# Patient Record
Sex: Female | Born: 1955 | Race: White | Hispanic: No | Smoking: Former smoker
Health system: Southern US, Community
[De-identification: ages and names within clinical notes are randomized; demographics above are authoritative.]

## PROBLEM LIST (undated history)

## (undated) DIAGNOSIS — I639 Cerebral infarction, unspecified: Secondary | ICD-10-CM

## (undated) DIAGNOSIS — R0602 Shortness of breath: Secondary | ICD-10-CM

## (undated) DIAGNOSIS — F329 Major depressive disorder, single episode, unspecified: Secondary | ICD-10-CM

## (undated) DIAGNOSIS — R351 Nocturia: Secondary | ICD-10-CM

## (undated) DIAGNOSIS — F32A Depression, unspecified: Secondary | ICD-10-CM

## (undated) DIAGNOSIS — H518 Other specified disorders of binocular movement: Secondary | ICD-10-CM

## (undated) DIAGNOSIS — R112 Nausea with vomiting, unspecified: Secondary | ICD-10-CM

## (undated) DIAGNOSIS — E785 Hyperlipidemia, unspecified: Secondary | ICD-10-CM

## (undated) DIAGNOSIS — Z9889 Other specified postprocedural states: Secondary | ICD-10-CM

## (undated) DIAGNOSIS — K8020 Calculus of gallbladder without cholecystitis without obstruction: Secondary | ICD-10-CM

## (undated) DIAGNOSIS — D649 Anemia, unspecified: Secondary | ICD-10-CM

## (undated) HISTORY — PX: TUBAL LIGATION: SHX77

## (undated) HISTORY — DX: Anemia, unspecified: D64.9

---

## 2000-10-09 ENCOUNTER — Other Ambulatory Visit: Admission: RE | Admit: 2000-10-09 | Discharge: 2000-10-09 | Payer: Self-pay | Admitting: Obstetrics and Gynecology

## 2001-06-28 ENCOUNTER — Encounter (INDEPENDENT_AMBULATORY_CARE_PROVIDER_SITE_OTHER): Payer: Self-pay | Admitting: Specialist

## 2001-06-28 ENCOUNTER — Ambulatory Visit (HOSPITAL_COMMUNITY): Admission: RE | Admit: 2001-06-28 | Discharge: 2001-06-28 | Payer: Self-pay | Admitting: Gastroenterology

## 2002-01-23 ENCOUNTER — Encounter (INDEPENDENT_AMBULATORY_CARE_PROVIDER_SITE_OTHER): Payer: Self-pay | Admitting: *Deleted

## 2002-01-23 ENCOUNTER — Ambulatory Visit (HOSPITAL_COMMUNITY): Admission: RE | Admit: 2002-01-23 | Discharge: 2002-01-23 | Payer: Self-pay | Admitting: Gastroenterology

## 2002-06-18 ENCOUNTER — Other Ambulatory Visit: Admission: RE | Admit: 2002-06-18 | Discharge: 2002-06-18 | Payer: Self-pay | Admitting: Obstetrics and Gynecology

## 2003-12-16 ENCOUNTER — Other Ambulatory Visit: Admission: RE | Admit: 2003-12-16 | Discharge: 2003-12-16 | Payer: Self-pay | Admitting: Obstetrics and Gynecology

## 2004-11-08 ENCOUNTER — Other Ambulatory Visit: Admission: RE | Admit: 2004-11-08 | Discharge: 2004-11-08 | Payer: Self-pay | Admitting: Obstetrics and Gynecology

## 2005-05-23 ENCOUNTER — Other Ambulatory Visit: Admission: RE | Admit: 2005-05-23 | Discharge: 2005-05-23 | Payer: Self-pay | Admitting: Obstetrics and Gynecology

## 2009-07-08 ENCOUNTER — Encounter: Admission: RE | Admit: 2009-07-08 | Discharge: 2009-07-08 | Payer: Self-pay | Admitting: Family Medicine

## 2009-09-15 ENCOUNTER — Encounter (INDEPENDENT_AMBULATORY_CARE_PROVIDER_SITE_OTHER): Payer: Self-pay | Admitting: Neurology

## 2009-09-15 ENCOUNTER — Ambulatory Visit: Payer: Self-pay | Admitting: Cardiology

## 2009-09-15 ENCOUNTER — Ambulatory Visit (HOSPITAL_COMMUNITY): Admission: RE | Admit: 2009-09-15 | Discharge: 2009-09-15 | Payer: Self-pay | Admitting: Neurology

## 2009-09-15 ENCOUNTER — Ambulatory Visit: Payer: Self-pay

## 2009-12-10 ENCOUNTER — Telehealth (INDEPENDENT_AMBULATORY_CARE_PROVIDER_SITE_OTHER): Payer: Self-pay | Admitting: *Deleted

## 2010-10-28 NOTE — Progress Notes (Signed)
  Faxed Echo over to [facility] to fax [phone] [facility]  December 10, 2009 2:18 PM

## 2011-02-11 NOTE — Procedures (Signed)
[place]. [facility]  Patient:    Paula Kim, Paula Kim. Visit Number: [id number] MRN: [id number]          Service Type: Attending:  Anselmo Rod, M.D. Dictated by:   Anselmo Rod, M.D. Proc. Date: 01/23/02   CC:         Marcelle Overlie, M.D.   Procedure Report  DATE OF BIRTH:  1956-01-17  REFERRING PHYSICIAN:  Marcelle Overlie, M.D.  PROCEDURE PERFORMED:  Flexible sigmoidoscopy with biopsies.  ENDOSCOPIST:  Anselmo Rod, M.D.  INSTRUMENT USED:  Olympus video colonoscope.  INDICATIONS FOR PROCEDURE:  History of adenomatous polyp removed in a 55 year old white female.  The patient had a somewhat abnormal-looking polyp in the rectum that was adenomatous in nature with no evidence of high grade dysplasia ____________ so repeat flexible sigmoidoscopy is being done to "re-look" at this area and rule out dysplasia or malignancy, or recurrent polyps.  PREPROCEDURE PREPARATION:  Informed consent was procured from the patient. The patient was fasted for eight hours prior to the procedure and prepped with two Fleets enemas the morning of the procedure.  PREPROCEDURE PHYSICAL:  The patient had stable vital signs.  Neck supple. Chest clear to auscultation.  S1, S2 regular.  Abdomen soft with normal bowel sounds.  DESCRIPTION OF PROCEDURE:  The patient was placed in the left lateral decubitus position and sedated with 50 mg of Demerol and 4 mg of Versed intravenously.  Once the patient was adequately sedated and maintained on low-flow oxygen and continuous cardiac monitoring, the Olympus video colonoscope was advanced from the rectum to 50 cm without difficulty. Multiple small rectosigmoid polyps were seen.  These were biopsied for pathology.  The patient had no other abnormalities noted and tolerated the procedure well without complication.  IMPRESSION:  Multiple small rectosigmoid polyps biopsied for pathology.  RECOMMENDATIONS:  Await  pathology results.  Further recommendations depending on pathology results.Dictated by:   Anselmo Rod, M.D. Attending:  Anselmo Rod, M.D. DD:  01/23/02 TD:  01/23/02 Job: [id number]

## 2011-02-11 NOTE — Procedures (Signed)
[place]  Patient:    Paula Kim, Paula Kim Visit Number: [id number] MRN: [id number]          Service Type: END Location: ENDO Attending Physician:  Charna Elizabeth Dictated by:   Anselmo Rod, M.D. Proc. Date: 06/28/01 Admit Date:  06/28/2001   CC:         Marcelle Overlie, M.D.   Procedure Report  DATE OF BIRTH:  May 30, 1956.  PROCEDURE:  Colonoscopy with snare polypectomy x 5.  ENDOSCOPIST:  Anselmo Rod, M.D.  INSTRUMENT USED:  Olympus video colonoscope.  INDICATION FOR PROCEDURE:  A 55 year old white female with a history of rectal bleeding and family history of colon cancer.  Rule out colonic polyps, masses, hemorrhoids, etc.  PREPROCEDURE PREPARATION:  Informed consent was procured from the patient. The patient was fasted for eight hours prior to the procedure and prepped with a bottle of magnesium citrate and a gallon of NuLytely the night prior to the procedure.  PREPROCEDURE PHYSICAL:  VITAL SIGNS:  The patient had stable vital signs.  NECK:  Supple.  CHEST:  Clear to auscultation.  S1, S2 regular.  ABDOMEN:  Soft with normal bowel sounds.  DESCRIPTION OF PROCEDURE:  The patient was placed in the left lateral decubitus position and sedated with 75 mcg of fentanyl and 7 mg of Versed intravenously.  Once the patient was adequately sedate and maintained on low-flow oxygen and continuous cardiac monitoring, the Olympus video colonoscope was advanced from the rectum to the cecum with extreme difficulty secondary to a large amount of residual stool in the right colon.  Five polyps were snared from the left colon.  Two of them were snared from the rectum, one from the rectosigmoid, and two from 30 cm.  Examination seemed somewhat inadequate because the entire colonic mucosa could not be examined properly (because of poor prep).  RECOMMENDATIONS: 1. Await pathology results. 2. Outpatient follow-up in two weeks. 3.  Repeat screening depending on pathology results. Dictated by:   Anselmo Rod, M.D. Attending Physician:  Charna Elizabeth DD:  06/28/01 TD:  06/29/01 Job: [id number]

## 2011-05-05 ENCOUNTER — Other Ambulatory Visit: Payer: Self-pay | Admitting: Obstetrics and Gynecology

## 2011-05-05 DIAGNOSIS — R928 Other abnormal and inconclusive findings on diagnostic imaging of breast: Secondary | ICD-10-CM

## 2011-05-13 ENCOUNTER — Ambulatory Visit
Admission: RE | Admit: 2011-05-13 | Discharge: 2011-05-13 | Disposition: A | Discharge status: Home / Self Care | Payer: 59 | Source: Ambulatory Visit | Attending: Obstetrics and Gynecology | Admitting: Obstetrics and Gynecology

## 2011-05-13 DIAGNOSIS — R928 Other abnormal and inconclusive findings on diagnostic imaging of breast: Secondary | ICD-10-CM

## 2011-12-29 ENCOUNTER — Emergency Department (HOSPITAL_COMMUNITY): Payer: 59

## 2011-12-29 ENCOUNTER — Encounter (HOSPITAL_COMMUNITY): Payer: Self-pay | Admitting: Emergency Medicine

## 2011-12-29 ENCOUNTER — Emergency Department (HOSPITAL_COMMUNITY)
Admission: EM | Admit: 2011-12-29 | Discharge: 2011-12-29 | Disposition: A | Discharge status: Home / Self Care | Payer: 59 | Attending: Emergency Medicine | Admitting: Emergency Medicine

## 2011-12-29 DIAGNOSIS — Z8673 Personal history of transient ischemic attack (TIA), and cerebral infarction without residual deficits: Secondary | ICD-10-CM | POA: Insufficient documentation

## 2011-12-29 DIAGNOSIS — Z79899 Other long term (current) drug therapy: Secondary | ICD-10-CM | POA: Insufficient documentation

## 2011-12-29 DIAGNOSIS — R1011 Right upper quadrant pain: Secondary | ICD-10-CM | POA: Insufficient documentation

## 2011-12-29 DIAGNOSIS — E785 Hyperlipidemia, unspecified: Secondary | ICD-10-CM | POA: Insufficient documentation

## 2011-12-29 HISTORY — DX: Hyperlipidemia, unspecified: E78.5

## 2011-12-29 HISTORY — DX: Cerebral infarction, unspecified: I63.9

## 2011-12-29 LAB — DIFFERENTIAL
Basophils Absolute: 0 10*3/uL (ref 0.0–0.1)
Basophils Relative: 0 % (ref 0–1)
Eosinophils Relative: 0 % (ref 0–5)
Lymphs Abs: 3 10*3/uL (ref 0.7–4.0)

## 2011-12-29 LAB — COMPREHENSIVE METABOLIC PANEL
ALT: 40 U/L — ABNORMAL HIGH (ref 0–35)
AST: 35 U/L (ref 0–37)
Albumin: 3.9 g/dL (ref 3.5–5.2)
BUN: 11 mg/dL (ref 6–23)
GFR calc Af Amer: >90 mL/min (ref >90)
GFR calc non Af Amer: >90 mL/min (ref >90)
Sodium: 138 mEq/L (ref 135–145)
Total Bilirubin: 0.8 mg/dL (ref 0.3–1.2)

## 2011-12-29 LAB — URINALYSIS, ROUTINE W REFLEX MICROSCOPIC
Bilirubin Urine: NEGATIVE
Hgb urine dipstick: NEGATIVE
Ketones, ur: NEGATIVE mg/dL
Specific Gravity, Urine: 1.014 (ref 1.005–1.030)
pH: 6 (ref 5.0–8.0)

## 2011-12-29 LAB — CBC
HCT: 40.1 % (ref 36.0–46.0)
MCV: 89.5 fL (ref 78.0–100.0)
WBC: 9 10*3/uL (ref 4.0–10.5)

## 2011-12-29 LAB — URINE MICROSCOPIC-ADD ON

## 2011-12-29 LAB — LIPASE, BLOOD: Lipase: 30 U/L (ref 11–59)

## 2011-12-29 MED ORDER — ONDANSETRON HCL 4 MG/2ML IJ SOLN
4.0000 mg | Freq: Once | INTRAMUSCULAR | Status: AC
  Administered 2011-12-29: 16:00:00 via INTRAVENOUS

## 2011-12-29 MED ORDER — CEFTRIAXONE SODIUM 250 MG IJ SOLR
250.0000 mg | Freq: Once | INTRAMUSCULAR | Status: AC
  Administered 2011-12-29: 250 mg via INTRAMUSCULAR
  Filled 2011-12-29: qty 250

## 2011-12-29 MED ORDER — ONDANSETRON HCL 4 MG/2ML IJ SOLN
INTRAMUSCULAR | Status: AC
  Filled 2011-12-29: qty 2

## 2011-12-29 MED ORDER — ONDANSETRON 4 MG PO TBDP: 4.0000 mg | ORAL_TABLET | Freq: Four times a day (QID) | ORAL | Status: AC | PRN

## 2011-12-29 MED ORDER — ONDANSETRON HCL 4 MG/2ML IJ SOLN
INTRAMUSCULAR | Status: AC
  Administered 2011-12-29: 4 mg
  Filled 2011-12-29: qty 2

## 2011-12-29 MED ORDER — LIDOCAINE HCL 1 % IJ SOLN
INTRAMUSCULAR | Status: AC
  Administered 2011-12-29: 20 mL
  Filled 2011-12-29: qty 20

## 2011-12-29 MED ORDER — POLYETHYLENE GLYCOL 3350 17 G PO PACK: 17.0000 g | PACK | Freq: Every day | ORAL | Status: AC

## 2011-12-29 MED ORDER — POTASSIUM CHLORIDE CRYS ER 20 MEQ PO TBCR
40.0000 meq | EXTENDED_RELEASE_TABLET | Freq: Once | ORAL | Status: AC
  Administered 2011-12-29: 40 meq via ORAL
  Filled 2011-12-29: qty 2

## 2011-12-29 MED ORDER — PROMETHAZINE HCL 25 MG/ML IJ SOLN
25.0000 mg | Freq: Once | INTRAMUSCULAR | Status: AC
  Administered 2011-12-29: 25 mg via INTRAVENOUS
  Filled 2011-12-29: qty 1

## 2011-12-29 MED ORDER — MORPHINE SULFATE 4 MG/ML IJ SOLN
8.0000 mg | INTRAMUSCULAR | Status: DC | PRN
  Administered 2011-12-29: 8 mg via INTRAVENOUS
  Filled 2011-12-29: qty 2

## 2011-12-29 NOTE — ED Notes (Signed)
Pt to u/s

## 2011-12-29 NOTE — ED Provider Notes (Signed)
History     CSN: [id number]  Arrival date & time 12/29/11  1555   First MD Initiated Contact with Patient 12/29/11 1749      Chief Complaint  Patient presents with  . Abdominal Pain    (Consider location/radiation/quality/duration/timing/severity/associated sxs/prior treatment) HPI Comments: Patient with history of hyperlipidemia and stroke presents emergency Department with chief complaint of vomiting and abdominal pain.  Symptoms began last week on Friday with abdominal cramping and have gradually worsened.  Patient began having emesis episodes Sunday morning and began developing right upper quadrant and epigastric abdominal pain.  The pain does not radiate is rated at a 8/10 in severity and described as sharp and cramping in nature.  Patient began to feel better and did not have any episodes of emesis on Monday Tuesday or Wednesday.  Last evening patient began having severe vomiting and abdominal pain again.  Patient's last bowel movement was greater than 7 days ago.  Patient has no history of abdominal surgery.  Patient denies hematuria, frequency, dysuria, hematemesis, chest pain, shortness of breath, fevers, night sweats, or chills.  Patient is a 56 y.o. female presenting with abdominal pain. The history is provided by the patient.  Abdominal Pain The primary symptoms of the illness include abdominal pain, nausea and vomiting. The primary symptoms of the illness do not include fever, shortness of breath, diarrhea or dysuria.  Additional symptoms associated with the illness include constipation. Symptoms associated with the illness do not include chills, urgency or frequency.    Past Medical History  Diagnosis Date  . Hyperlipidemia   . Stroke     Past Surgical History  Procedure Date  . Tubal ligation     History reviewed. No pertinent family history.  History  Substance Use Topics  . Smoking status: Never Smoker   . Smokeless tobacco: Not on file  . Alcohol Use: No     OB History    Grav Para Term Preterm Abortions TAB SAB Ect Mult Living                  Review of Systems  Constitutional: Negative for fever, chills and appetite change.  HENT: Negative for congestion.   Eyes: Negative for visual disturbance.  Respiratory: Negative for shortness of breath.   Cardiovascular: Negative for chest pain and leg swelling.  Gastrointestinal: Positive for nausea, vomiting, abdominal pain and constipation. Negative for diarrhea.  Genitourinary: Negative for dysuria, urgency and frequency.  Neurological: Negative for dizziness, syncope, weakness, light-headedness, numbness and headaches.  Psychiatric/Behavioral: Negative for confusion.  All other systems reviewed and are negative.    Allergies  Erythromycin  Home Medications   Current Outpatient Rx  Name Route Sig Dispense Refill  . ASPIRIN EC 325 MG PO TBEC Oral Take 325 mg by mouth daily.    . FENOFIBRATE MICRONIZED 200 MG PO CAPS Oral Take 200 mg by mouth daily.    . OMEGA-3 FATTY ACIDS 1000 MG PO CAPS Oral Take 1 g by mouth 2 (two) times daily.    . IBUPROFEN 200 MG PO TABS Oral Take 400 mg by mouth every 8 (eight) hours as needed. For pain.    Marland Kitchen NIACIN ER 500 MG PO TBCR Oral Take 500 mg by mouth 2 (two) times daily with a meal.    . PRAVASTATIN SODIUM 40 MG PO TABS Oral Take 40 mg by mouth daily.    . VENLAFAXINE HCL ER 150 MG PO CP24 Oral Take 150 mg by mouth daily.    Marland Kitchen  ZOLPIDEM TARTRATE 10 MG PO TABS Oral Take 10 mg by mouth at bedtime as needed. For sleep.      BP 144/80  Pulse 71  Temp(Src) 98 F (36.7 C) (Oral)  Ht 5\' 2"  (1.575 m)  Wt 150 lb (68.04 kg)  BMI 27.44 kg/m2  SpO2 100%  Physical Exam  Nursing note and vitals reviewed. Constitutional: Vital signs are normal. She appears well-developed and well-nourished. No distress.  HENT:  Head: Normocephalic and atraumatic.  Mouth/Throat: Uvula is midline, oropharynx is clear and moist and mucous membranes are normal.  Eyes:  Conjunctivae and EOM are normal. Pupils are equal, round, and reactive to light.  Neck: Normal range of motion and full passive range of motion without pain. Neck supple. No spinous process tenderness and no muscular tenderness present. No rigidity. No Brudzinski's sign noted.  Cardiovascular: Normal rate and regular rhythm.   Pulmonary/Chest: Effort normal and breath sounds normal. No accessory muscle usage. Not tachypneic. No respiratory distress.  Abdominal: Soft. Normal appearance and bowel sounds are normal. She exhibits no distension, no ascites, no pulsatile midline mass and no mass. There is tenderness. There is no CVA tenderness. No hernia.    Lymphadenopathy:    She has no cervical adenopathy.  Neurological: She is alert.  Skin: Skin is warm and dry. No rash noted. She is not diaphoretic.  Psychiatric: She has a normal mood and affect. Her speech is normal and behavior is normal.    ED Course  Procedures (including critical care time)  Labs Reviewed  URINALYSIS, ROUTINE W REFLEX MICROSCOPIC - Abnormal; Notable for the following:    APPearance CLOUDY (*)    Urobilinogen, UA 2.0 (*)    Nitrite POSITIVE (*)    Leukocytes, UA TRACE (*)    All other components within normal limits  URINE MICROSCOPIC-ADD ON - Abnormal; Notable for the following:    Squamous Epithelial / LPF FEW (*)    Bacteria, UA FEW (*)    All other components within normal limits  CBC  DIFFERENTIAL  COMPREHENSIVE METABOLIC PANEL   No results found.   No diagnosis found.    MDM  UTI, abdominal pain  Labs reviewed, acute abdominal series with no acute abnormalities. Pt requests stool softener at dc.Pt given a shot of rocephin for UTI and will be dc w keflex for UTI.  [place] pending. Care resumed by Grant Fontana, PA-C. Plan is to dc pt home if normal results or set up appropriate consults/follow up if cholelithiasis is presenet. Vitals are stable, no fever. Tolerating PO fluids > 6 oz.  Lungs are  clear.         Jaci Carrel, [place] 12/30/11 [phone]

## 2011-12-29 NOTE — Discharge Instructions (Signed)
Follow up with your primary care doctor about your hospital visit. Continue to hydrate orally.Take all medications as prescribed & use Zofran as directed for nausea & vomiting.  Read the instructions below for reasons to return to the ER.   Abdominal Pain  Your exam might not show the exact reason you have abdominal pain. Since there are many different causes of abdominal pain, another checkup and more tests may be needed. It is very important to follow up for lasting (persistent) or worsening symptoms. A possible cause of abdominal pain in any person who still has his or her appendix is acute appendicitis. Appendicitis is often hard to diagnose. Normal blood tests, urine tests, ultrasound, and CT scans do not completely rule out early appendicitis or other causes of abdominal pain. Sometimes, only the changes that happen over time will allow appendicitis and other causes of abdominal pain to be determined. Other potential problems that may require surgery may also take time to become more apparent. Because of this, it is important that you follow all of the instructions below.   HOME CARE INSTRUCTIONS  Do not take laxatives unless directed by your caregiver. Rest as much as possible.  Do not eat solid food until your pain is gone: A diet of water, weak decaffeinated tea, broth or bouillon, gelatin, oral rehydration solutions (ORS), frozen ice pops, or ice chips may be helpful.  When pain is gone: Start a light diet (dry toast, crackers, applesauce, or white rice). Increase the diet slowly as long as it does not bother you. Eat no dairy products (including cheese and eggs) and no spicy, fatty, fried, or high-fiber foods.  Use no alcohol, caffeine, or cigarettes.  Take your regular medicines unless your caregiver told you not to.  Take any prescribed medicine as directed.   SEEK IMMEDIATE MEDICAL CARE IF:  The pain does not go away.  You have a fever >101 that persists You keep throwing up (vomiting)  or cannot drink liquids.  The pain becomes localized (Pain in the right side could possibly be appendicitis. In an adult, pain in the left lower portion of the abdomen could be colitis or diverticulitis). You pass bloody or black tarry stools.  You have shaking chills.  There is blood in your vomit or you see blood in your bowel movements.  Your bowel movements stop (become blocked) or you cannot pass gas.  You have bloody, frequent, or painful urination.  You have yellow discoloration in the skin or whites of the eyes.  Your stomach becomes bloated or bigger.  You have dizziness or fainting.  You have chest or back pain.      The 'BRAT' diet is suggested, then progress to diet as tolerated as symptoms abate. Call if bloody stools, persistent diarrhea, vomiting, fever or abdominal pain. Bananas.  Rice.  Applesauce.  Toast (and other simple starches such as crackers, potatoes, noodles).   SEEK IMMEDIATE MEDICAL ATTENTION IF:  You begin having localized abdominal pain that does not go away or becomes severe (The right side could  possibly be appendicitis. In an adult, the left lower portion of the abdomen could be colitis or diverticulitis)   A temperature above 101 develops  Repeated vomiting occurs (multiple uncontrollable episodes) or you are unable to keep fluids down  Blood is being passed in stools or vomit (bright red or black tarry stools).   Return also if you develop chest pain, difficulty breathing, dizziness or fainting, or become confused, poorly responsive, or inconsolable (  young children).   RESOURCE GUIDE  Dental Problems  Patients with Medicaid: [facility] [phone] [address]                                           [phone] [address] Phone:  [phone]                                                  Phone:  [phone]  If unable to pay or uninsured, contact:  [facility] or [facility]. to  become qualified for the adult dental clinic.  Chronic Pain Problems Contact [facility]  [phone] Patients need to be referred by their primary care doctor.  Insufficient Money for Medicine Contact [employer]:  call "211" or [facility] 604 800 8141.  No Primary Care Doctor Call [facility]  [phone] Other agencies that provide inexpensive medical care    [facility]  [phone]    [facility]  [phone]    [facility]  [phone]    [facility]  [phone]    [facility]  (541) 248-3336    [facility]  [phone]  Psychological Services [facility]  [phone] [facility]  [phone] [facility] Mental Health   [phone] (emergency services ([phone])  Substance Abuse Resources [facility]  256-106-6346 [facility] 6478682922 [facility] [place] 6050404553 Floydene Flock 7262244341 Residential & Outpatient Substance Abuse Program  [phone]  Abuse/Neglect [facility] Child Abuse Hotline (862) 870-7904 [facility] Child Abuse Hotline 309-087-5314 (After Hours)  Emergency Shelter [facility] (614) 591-0577  Maternity Homes [facility] at the [place] [facility] [place] (559)330-4356 [facility] 6810645588  MRSA Hotline #:   (519)249-8686    [facility] Resources  [facility] [place]     [employer]                          [facility] Dept. [address] [place]                       [address]      [address]                                                  Cristobal Goldmann Phone:  [phone]                                   Phone:  [phone]                 Phone:  [phone]  [facility] Mental  Health Phone:  [phone]  [facility] Child Abuse Hotline 8737702801 6574631056 (After Hours)

## 2011-12-29 NOTE — ED Notes (Signed)
ZOX:[id number]<AV> Expected date:12/29/11<BR> Expected time: 3:46 PM<BR> Means of arrival:Ambulance<BR> Comments:<BR> M51. 56 yo f. abd pain, no BM in 1 wk, 15 min

## 2011-12-29 NOTE — ED Notes (Addendum)
Per EMS, pt has had N/V and constipation since last Friday.  Given Zofran IV per EMS

## 2011-12-29 NOTE — ED Notes (Signed)
Pt began vomiting clear emesis and now c/o abd pain.  Order received for IV Zofran.

## 2011-12-30 ENCOUNTER — Telehealth (INDEPENDENT_AMBULATORY_CARE_PROVIDER_SITE_OTHER): Payer: Self-pay | Admitting: General Surgery

## 2011-12-30 NOTE — Telephone Encounter (Signed)
Dr. Wayland Salinas contacting [facility] for referral of pt seen last night in the [facility] ER for gallbladder attack.  She "fell through the cracks" there for being referred to [facility] while in the ER at the time, so he was calling to have her seen in the office.  Appt made with Dr. Biagio Quint and pt called.

## 2011-12-30 NOTE — ED Provider Notes (Signed)
8:00 PM Signout from [place], [place]. Pt with RUQ abd pain, vomiting x 1 week. No hx abd surgery. Labs largely unremarkable. Read of abd u/s pending to r/o GB pathology; if negative, can be discharged home. Discharge paperwork (assuming the [place] is negative) has been prepared.  7:30 AM I spoke with Drue Novel, PA-C - I did not officially discharge this patient. Apparently, the patient's nurse saw that the patient's paperwork had been printed, and set the disposition to discharge without my knowledge. Pt's US shows evidence of GB wall thickening and stones. Pt has been contacted at home and is to return to the ED at [facility] for further care to include probable admission (please see Paz's note).  [place], [place] 12/30/11 [phone]

## 2011-12-30 NOTE — ED Notes (Cosign Needed)
8:03 AM Pt chart reviewed and saw results of yesterdays US showing gallstones, wall thickening, and common bile duct dilatation. Patient was discharged with out proper consults and follow up.  Spoke With Grant Fontana, PA-C who states that she did not put the patient up for discharge. I called Ms. Koenig this morning who states she is feeling much better, is currently pain free and has not has an emesis episode since home. She has been advised to return to the ED to receive the care recommended. I have contacted Ulyess Mort to see if we can help patient with ED cost bc her discharge was not indicated. Pt has arranged a ride to the ED at 10 AM and registration has been notified of her arrival. Pt advised to stay NPO and I will resume care upon her arrival. Dr. Ignacia Palma has been notified of the situation and is agreeable with my plan to have pt return to ED for admit to general surgery.   Jaci Carrel, [place] 12/30/11 [phone]

## 2011-12-31 NOTE — ED Provider Notes (Signed)
Medical screening examination/treatment/procedure(s) were performed by non-physician practitioner and as supervising physician I was immediately available for consultation/collaboration.  Toy Baker, MD 12/31/11 [phone]

## 2012-01-02 NOTE — ED Provider Notes (Signed)
Medical screening examination/treatment/procedure(s) were performed by non-physician practitioner and as supervising physician I was immediately available for consultation/collaboration.  Jules Baty R. Jamisha Hoeschen, MD 01/02/12 1456 

## 2012-01-20 ENCOUNTER — Ambulatory Visit (INDEPENDENT_AMBULATORY_CARE_PROVIDER_SITE_OTHER): Payer: 59 | Admitting: General Surgery

## 2012-01-20 ENCOUNTER — Encounter (INDEPENDENT_AMBULATORY_CARE_PROVIDER_SITE_OTHER): Payer: Self-pay | Admitting: General Surgery

## 2012-01-20 DIAGNOSIS — R109 Unspecified abdominal pain: Secondary | ICD-10-CM

## 2012-01-20 DIAGNOSIS — K8020 Calculus of gallbladder without cholecystitis without obstruction: Secondary | ICD-10-CM

## 2012-01-20 NOTE — Progress Notes (Signed)
Patient ID: Paula Kim, female   DOB: [birth year], 56 y.o.   MRN: [id number]  Chief Complaint  Patient presents with  . Pre-op Exam    eval gallbladder with stones    HPI Paula Kim is a 56 y.o. female.  This patient is referred from the emergency room for evaluation of abdominal pain. She says that she has had 10 year history of off-and-on abdominal pain which occurs about every other month unless approximately 1-2 days and then spontaneously resolved. Most recently, she had acute onset of abdominal pain which she describes as lower abdominal discomfort which is bilateral and had no radiation. She had no back pain and she did have some associated dry heaving for a pack for 6 days but no vomiting. She had increasing abdominal pain which t emergency room after calling EMS. She described as a dull pain and she had some associated fevers although she did not measure her temperature and associated sweats. She says that her pain resolved after medications in the emergency room and she is not having symptoms since then. She says that her only relief was obtained with elevating her legs and she denied any specific food association. She denies any heartburn or peptic OC disease. She says that her bowels are normal without any blood in the stools or melena. She does have a history of a colonoscopy and she is "due for another". HPI  Past Medical History  Diagnosis Date  . Hyperlipidemia   . Stroke   . Anemia     Past Surgical History  Procedure Date  . Tubal ligation     Family History  Problem Relation Age of Onset  . COPD Father   . Cancer Maternal Grandfather     colon    Social History History  Substance Use Topics  . Smoking status: Former Smoker    Quit date: 01/19/2006  . Smokeless tobacco: Not on file  . Alcohol Use: No    Allergies  Allergen Reactions  . Erythromycin Swelling and Rash    Current Outpatient Prescriptions  Medication Sig Dispense Refill  .  aspirin EC 325 MG tablet Take 325 mg by mouth daily.      . fenofibrate micronized (LOFIBRA) 200 MG capsule Take 200 mg by mouth daily.      . fish oil-omega-3 fatty acids 1000 MG capsule Take 1 g by mouth 2 (two) times daily.      Marland Kitchen ibuprofen (ADVIL,MOTRIN) 200 MG tablet Take 400 mg by mouth every 8 (eight) hours as needed. For pain.      . niacin (SLO-NIACIN) 500 MG tablet Take 500 mg by mouth 2 (two) times daily with a meal.      . pravastatin (PRAVACHOL) 40 MG tablet Take 40 mg by mouth daily.      Marland Kitchen venlafaxine (EFFEXOR-XR) 150 MG 24 hr capsule Take 150 mg by mouth daily.      Marland Kitchen zolpidem (AMBIEN) 10 MG tablet Take 10 mg by mouth at bedtime as needed. For sleep.        Review of Systems Review of Systems All other review of systems negative or noncontributory except as stated in the HPI  Blood pressure 142/95, pulse 73, temperature 96.4 F (35.8 C), temperature source Temporal, height 5\' 1"  (1.549 m), weight 164 lb 6.4 oz (74.571 kg), SpO2 98.00%.  Physical Exam Physical Exam Physical Exam  Nursing note and vitals reviewed. Constitutional: She is oriented to person, place, and time. She appears well-developed and  well-nourished. No distress.  HENT:  Head: Normocephalic and atraumatic.  Mouth/Throat: No oropharyngeal exudate.  Eyes: Conjunctivae and EOM are normal. Pupils are equal, round, and reactive to light. Right eye exhibits no discharge. Left eye exhibits no discharge. No scleral icterus.  Neck: Normal range of motion. Neck supple. No tracheal deviation present.  Cardiovascular: Normal rate, regular rhythm, normal heart sounds and intact distal pulses.   Pulmonary/Chest: Effort normal and breath sounds normal. No stridor. No respiratory distress. She has no wheezes.  Abdominal: Soft. Bowel sounds are normal. She exhibits no distension and no mass. There is no tenderness. There is no rebound and no guarding.  Musculoskeletal: Normal range of motion. She exhibits no edema and no  tenderness.  Neurological: She is alert and oriented to person, place, and time.  Skin: Skin is warm and dry. No rash noted. She is not diaphoretic. No erythema. No pallor.  Psychiatric: She has a normal mood and affect. Her behavior is normal. Judgment and thought content normal.      Data Reviewed [place], labs  Assessment    Abdominal pain and cholelithiasis Though she does appear to have some small gallstones on ultrasound as well as a thickened gallbladder, her symptoms are not classic for symptomatic cholelithiasis. I am not convinced that her lower abdominal pain is due to her gallstones. Her white count was normal and her LFTs were also normal. I had a long discussion with her regarding this and I recommended that she be seen by her gastroenterologist Dr. Loreta Ave for evaluation and repeat colonoscopy in second opinion with regard to her cholelithiasis. I did offer to perform cholecystectomy given her gallstones and a thickened gallbladder and history of recurrent abdominal pain, however, I did stress the fact that there would certainly be no guarantee of relief of her symptoms given her atypical symptoms. She will see Dr. Loreta Ave and let me know if she would like to have cholecystectomy    Plan    Gastroenterology evaluation and consider cholecystectomy after this.       Lodema Pilot DAVID 01/20/2012, 9:47 AM

## 2012-01-31 ENCOUNTER — Other Ambulatory Visit (INDEPENDENT_AMBULATORY_CARE_PROVIDER_SITE_OTHER): Payer: Self-pay | Admitting: General Surgery

## 2012-03-06 ENCOUNTER — Encounter (HOSPITAL_COMMUNITY): Payer: Self-pay | Admitting: Pharmacy Technician

## 2012-03-12 ENCOUNTER — Encounter (HOSPITAL_COMMUNITY)
Admission: RE | Admit: 2012-03-12 | Discharge: 2012-03-12 | Disposition: A | Discharge status: Home / Self Care | Payer: 59 | Source: Ambulatory Visit | Attending: General Surgery | Admitting: General Surgery

## 2012-03-12 ENCOUNTER — Encounter (HOSPITAL_COMMUNITY): Payer: Self-pay

## 2012-03-12 HISTORY — DX: Major depressive disorder, single episode, unspecified: F32.9

## 2012-03-12 HISTORY — DX: Other specified disorders of binocular movement: H51.8

## 2012-03-12 HISTORY — DX: Nausea with vomiting, unspecified: R11.2

## 2012-03-12 HISTORY — DX: Other specified postprocedural states: Z98.890

## 2012-03-12 HISTORY — DX: Shortness of breath: R06.02

## 2012-03-12 HISTORY — DX: Calculus of gallbladder without cholecystitis without obstruction: K80.20

## 2012-03-12 HISTORY — DX: Depression, unspecified: F32.A

## 2012-03-12 HISTORY — DX: Nocturia: R35.1

## 2012-03-12 LAB — CBC
MCH: 31.6 pg (ref 26.0–34.0)
MCHC: 33.7 g/dL (ref 30.0–36.0)
Platelets: 339 10*3/uL (ref 150–400)
RBC: 4.33 MIL/uL (ref 3.87–5.11)
RDW: 12.7 % (ref 11.5–15.5)

## 2012-03-12 LAB — SURGICAL PCR SCREEN: MRSA, PCR: NEGATIVE

## 2012-03-12 NOTE — Patient Instructions (Signed)
20 Paula Kim  03/12/2012   Your procedure is scheduled on:  03/16/12  Report to SHORT STAY DEPT  at 5:30 AM.  Call this number if you have problems the morning of surgery: [phone]   Remember:   Do not eat food or drink liquids AFTER MIDNIGHT    Take these medicines the morning of surgery with A SIP OF WATER: PEPCID / EFFEXOR   Do not wear jewelry, make-up or nail polish.  Do not wear lotions, powders, or perfumes.   Do not shave legs or underarms 48 hrs. before surgery (men may shave face)  Do not bring valuables to the hospital.  Contacts, dentures or bridgework may not be worn into surgery.  Leave suitcase in the car. After surgery it may be brought to your room.  For patients admitted to the hospital, checkout time is 11:00 AM the day of discharge.   Patients discharged the day of surgery will not be allowed to drive home.    Special Instructions:   Please read over the following fact sheets that you were given: MRSA  Information               SHOWER WITH BETASEPT THE NIGHT BEFORE SURGERY AND THE MORNING OF SURGERY

## 2012-03-16 ENCOUNTER — Encounter (HOSPITAL_COMMUNITY)
Admission: RE | Disposition: A | Discharge status: Home / Self Care | Payer: Self-pay | Source: Ambulatory Visit | Attending: General Surgery

## 2012-03-16 ENCOUNTER — Encounter (HOSPITAL_COMMUNITY): Payer: Self-pay

## 2012-03-16 ENCOUNTER — Ambulatory Visit (HOSPITAL_COMMUNITY): Payer: 59

## 2012-03-16 ENCOUNTER — Observation Stay (HOSPITAL_COMMUNITY)
Admission: RE | Admit: 2012-03-16 | Discharge: 2012-03-17 | Disposition: A | Discharge status: Home / Self Care | Payer: 59 | Source: Ambulatory Visit | Attending: General Surgery | Admitting: General Surgery

## 2012-03-16 ENCOUNTER — Encounter (HOSPITAL_COMMUNITY): Payer: Self-pay | Admitting: Anesthesiology

## 2012-03-16 ENCOUNTER — Ambulatory Visit (HOSPITAL_COMMUNITY): Payer: 59 | Admitting: Anesthesiology

## 2012-03-16 DIAGNOSIS — K8070 Calculus of gallbladder and bile duct without cholecystitis without obstruction: Principal | ICD-10-CM | POA: Insufficient documentation

## 2012-03-16 DIAGNOSIS — K824 Cholesterolosis of gallbladder: Secondary | ICD-10-CM

## 2012-03-16 DIAGNOSIS — K811 Chronic cholecystitis: Secondary | ICD-10-CM

## 2012-03-16 DIAGNOSIS — Z01810 Encounter for preprocedural cardiovascular examination: Secondary | ICD-10-CM | POA: Insufficient documentation

## 2012-03-16 DIAGNOSIS — Z8673 Personal history of transient ischemic attack (TIA), and cerebral infarction without residual deficits: Secondary | ICD-10-CM | POA: Insufficient documentation

## 2012-03-16 DIAGNOSIS — Z01812 Encounter for preprocedural laboratory examination: Secondary | ICD-10-CM | POA: Insufficient documentation

## 2012-03-16 DIAGNOSIS — E785 Hyperlipidemia, unspecified: Secondary | ICD-10-CM | POA: Insufficient documentation

## 2012-03-16 HISTORY — PX: ERCP: SHX5425

## 2012-03-16 HISTORY — PX: CHOLECYSTECTOMY: SHX55

## 2012-03-16 LAB — BASIC METABOLIC PANEL
CO2: 24 mEq/L (ref 19–32)
Calcium: 9.3 mg/dL (ref 8.4–10.5)
GFR calc Af Amer: >90 mL/min (ref >90)
GFR calc non Af Amer: >90 mL/min (ref >90)
Sodium: 136 mEq/L (ref 135–145)

## 2012-03-16 SURGERY — ERCP, WITH INTERVENTION IF INDICATED
Anesthesia: Moderate Sedation

## 2012-03-16 SURGERY — LAPAROSCOPIC CHOLECYSTECTOMY WITH INTRAOPERATIVE CHOLANGIOGRAM
Anesthesia: General | Site: Abdomen | Wound class: Clean Contaminated

## 2012-03-16 MED ORDER — VENLAFAXINE HCL ER 150 MG PO CP24
150.0000 mg | ORAL_CAPSULE | Freq: Every day | ORAL | Status: DC
  Administered 2012-03-17: 150 mg via ORAL
  Filled 2012-03-16: qty 1

## 2012-03-16 MED ORDER — LACTATED RINGERS IV SOLN
INTRAVENOUS | Status: DC | PRN
  Administered 2012-03-16 (×2): via INTRAVENOUS

## 2012-03-16 MED ORDER — HYDROCODONE-ACETAMINOPHEN 5-325 MG PO TABS: 1.0000 | ORAL_TABLET | ORAL | Status: DC | PRN

## 2012-03-16 MED ORDER — FENTANYL CITRATE 0.05 MG/ML IJ SOLN
INTRAMUSCULAR | Status: AC
  Filled 2012-03-16: qty 2

## 2012-03-16 MED ORDER — SODIUM CHLORIDE 0.9 % IR SOLN
Status: DC | PRN
  Administered 2012-03-16: 10 mL

## 2012-03-16 MED ORDER — ONDANSETRON HCL 4 MG PO TABS: 4.0000 mg | ORAL_TABLET | Freq: Four times a day (QID) | ORAL | Status: DC | PRN

## 2012-03-16 MED ORDER — EPINEPHRINE HCL 0.1 MG/ML IJ SOLN
INTRAMUSCULAR | Status: AC
  Filled 2012-03-16: qty 20

## 2012-03-16 MED ORDER — ONDANSETRON HCL 4 MG/2ML IJ SOLN
INTRAMUSCULAR | Status: DC | PRN
  Administered 2012-03-16: 4 mg via INTRAVENOUS

## 2012-03-16 MED ORDER — DIPHENHYDRAMINE HCL 50 MG/ML IJ SOLN
INTRAMUSCULAR | Status: AC
  Filled 2012-03-16: qty 1

## 2012-03-16 MED ORDER — FAMOTIDINE 20 MG PO TABS
20.0000 mg | ORAL_TABLET | Freq: Two times a day (BID) | ORAL | Status: DC
  Administered 2012-03-16 – 2012-03-17 (×2): 20 mg via ORAL
  Filled 2012-03-16 (×3): qty 1

## 2012-03-16 MED ORDER — LACTATED RINGERS IV SOLN
INTRAVENOUS | Status: DC | PRN
  Administered 2012-03-16: 1000 mL

## 2012-03-16 MED ORDER — SODIUM CHLORIDE 0.9 % IV SOLN
INTRAVENOUS | Status: DC | PRN
  Administered 2012-03-16: 14:00:00

## 2012-03-16 MED ORDER — GLUCAGON HCL (RDNA) 1 MG IJ SOLR
INTRAMUSCULAR | Status: AC
  Filled 2012-03-16: qty 2

## 2012-03-16 MED ORDER — CEFAZOLIN SODIUM 1-5 GM-% IV SOLN
INTRAVENOUS | Status: AC
  Filled 2012-03-16: qty 50

## 2012-03-16 MED ORDER — CEFAZOLIN SODIUM-DEXTROSE 2-3 GM-% IV SOLR
2.0000 g | INTRAVENOUS | Status: AC
  Administered 2012-03-16: 1 g via INTRAVENOUS

## 2012-03-16 MED ORDER — FENTANYL CITRATE 0.05 MG/ML IJ SOLN
INTRAMUSCULAR | Status: AC
  Filled 2012-03-16: qty 4

## 2012-03-16 MED ORDER — DEXAMETHASONE SODIUM PHOSPHATE 10 MG/ML IJ SOLN
INTRAMUSCULAR | Status: DC | PRN
  Administered 2012-03-16: 10 mg via INTRAVENOUS

## 2012-03-16 MED ORDER — SODIUM CHLORIDE 0.9 % IJ SOLN
INTRAMUSCULAR | Status: DC | PRN
  Administered 2012-03-16: 14:00:00

## 2012-03-16 MED ORDER — IOHEXOL 300 MG/ML  SOLN
INTRAMUSCULAR | Status: AC
  Filled 2012-03-16: qty 1

## 2012-03-16 MED ORDER — BUPIVACAINE-EPINEPHRINE PF 0.25-1:200000 % IJ SOLN
INTRAMUSCULAR | Status: AC
  Filled 2012-03-16: qty 30

## 2012-03-16 MED ORDER — ROCURONIUM BROMIDE 100 MG/10ML IV SOLN
INTRAVENOUS | Status: DC | PRN
  Administered 2012-03-16: 40 mg via INTRAVENOUS

## 2012-03-16 MED ORDER — MIDAZOLAM HCL 10 MG/2ML IJ SOLN
INTRAMUSCULAR | Status: DC | PRN
  Administered 2012-03-16: 2 mg via INTRAVENOUS
  Administered 2012-03-16 (×2): 1 mg via INTRAVENOUS
  Administered 2012-03-16: 2 mg via INTRAVENOUS
  Administered 2012-03-16: 1 mg via INTRAVENOUS
  Administered 2012-03-16: 2 mg via INTRAVENOUS
  Administered 2012-03-16: 1 mg via INTRAVENOUS

## 2012-03-16 MED ORDER — ACETAMINOPHEN 10 MG/ML IV SOLN
INTRAVENOUS | Status: AC
  Filled 2012-03-16: qty 100

## 2012-03-16 MED ORDER — MORPHINE SULFATE 2 MG/ML IJ SOLN
2.0000 mg | INTRAMUSCULAR | Status: DC | PRN
  Administered 2012-03-16 – 2012-03-17 (×3): 2 mg via INTRAVENOUS
  Filled 2012-03-16 (×3): qty 1

## 2012-03-16 MED ORDER — HYDROMORPHONE HCL PF 1 MG/ML IJ SOLN: 0.2500 mg | INTRAMUSCULAR | Status: DC | PRN

## 2012-03-16 MED ORDER — LIDOCAINE HCL 1 % IJ SOLN
INTRAMUSCULAR | Status: AC
  Filled 2012-03-16: qty 20

## 2012-03-16 MED ORDER — NEOSTIGMINE METHYLSULFATE 1 MG/ML IJ SOLN
INTRAMUSCULAR | Status: DC | PRN
  Administered 2012-03-16: 4 mg via INTRAVENOUS

## 2012-03-16 MED ORDER — EPINEPHRINE HCL 0.1 MG/ML IJ SOLN
INTRAMUSCULAR | Status: AC
  Filled 2012-03-16: qty 10

## 2012-03-16 MED ORDER — GLYCOPYRROLATE 0.2 MG/ML IJ SOLN
INTRAMUSCULAR | Status: DC | PRN
  Administered 2012-03-16: 0.6 mg via INTRAVENOUS

## 2012-03-16 MED ORDER — LIDOCAINE HCL (CARDIAC) 20 MG/ML IV SOLN
INTRAVENOUS | Status: DC | PRN
  Administered 2012-03-16: 50 mg via INTRAVENOUS

## 2012-03-16 MED ORDER — LIDOCAINE-EPINEPHRINE (PF) 1 %-1:200000 IJ SOLN
INTRAMUSCULAR | Status: DC | PRN
  Administered 2012-03-16: 10 mL

## 2012-03-16 MED ORDER — KCL IN DEXTROSE-NACL 20-5-0.45 MEQ/L-%-% IV SOLN
INTRAVENOUS | Status: DC
  Administered 2012-03-16 – 2012-03-17 (×2): via INTRAVENOUS
  Filled 2012-03-16 (×4): qty 1000

## 2012-03-16 MED ORDER — ONDANSETRON HCL 4 MG/2ML IJ SOLN: 4.0000 mg | Freq: Four times a day (QID) | INTRAMUSCULAR | Status: DC | PRN

## 2012-03-16 MED ORDER — ENOXAPARIN SODIUM 40 MG/0.4ML ~~LOC~~ SOLN
40.0000 mg | SUBCUTANEOUS | Status: DC
  Administered 2012-03-17: 40 mg via SUBCUTANEOUS
  Filled 2012-03-16 (×2): qty 0.4

## 2012-03-16 MED ORDER — GLUCAGON HCL (RDNA) 1 MG IJ SOLR
INTRAMUSCULAR | Status: DC | PRN
  Administered 2012-03-16: .5 mL via INTRAVENOUS

## 2012-03-16 MED ORDER — PROPOFOL 10 MG/ML IV BOLUS
INTRAVENOUS | Status: DC | PRN
  Administered 2012-03-16: 50 mg via INTRAVENOUS
  Administered 2012-03-16: 150 mg via INTRAVENOUS

## 2012-03-16 MED ORDER — FENTANYL CITRATE 0.05 MG/ML IJ SOLN
INTRAMUSCULAR | Status: DC | PRN
  Administered 2012-03-16 (×2): 100 ug via INTRAVENOUS
  Administered 2012-03-16: 50 ug via INTRAVENOUS
  Administered 2012-03-16: 100 ug via INTRAVENOUS

## 2012-03-16 MED ORDER — MIDAZOLAM HCL 10 MG/2ML IJ SOLN
INTRAMUSCULAR | Status: AC
  Filled 2012-03-16: qty 4

## 2012-03-16 MED ORDER — LACTATED RINGERS IV SOLN: INTRAVENOUS | Status: DC

## 2012-03-16 MED ORDER — BUTAMBEN-TETRACAINE-BENZOCAINE 2-2-14 % EX AERO
INHALATION_SPRAY | CUTANEOUS | Status: DC | PRN
  Administered 2012-03-16: 1 via TOPICAL

## 2012-03-16 MED ORDER — IOHEXOL 300 MG/ML  SOLN
INTRAMUSCULAR | Status: DC | PRN
  Administered 2012-03-16: 10 mL

## 2012-03-16 MED ORDER — BUPIVACAINE HCL (PF) 0.25 % IJ SOLN
INTRAMUSCULAR | Status: DC | PRN
  Administered 2012-03-16: 10 mL

## 2012-03-16 MED ORDER — FENTANYL CITRATE 0.05 MG/ML IJ SOLN
INTRAMUSCULAR | Status: DC | PRN
  Administered 2012-03-16 (×5): 25 ug via INTRAVENOUS

## 2012-03-16 MED ORDER — ACETAMINOPHEN 10 MG/ML IV SOLN
INTRAVENOUS | Status: DC | PRN
  Administered 2012-03-16: 1000 mg via INTRAVENOUS

## 2012-03-16 SURGICAL SUPPLY — 42 items
ADH SKN CLS APL DERMABOND .7 (GAUZE/BANDAGES/DRESSINGS) ×1
APPLICATOR COTTON TIP 6IN STRL (MISCELLANEOUS) ×8 IMPLANT
APPLIER CLIP LOGIC TI 5 (MISCELLANEOUS) ×2 IMPLANT
APPLIER CLIP ROT 10 11.4 M/L (STAPLE)
APR CLP MED LRG 11.4X10 (STAPLE)
APR CLP MED LRG 33X5 (MISCELLANEOUS) ×1
BAG SPEC RTRVL LRG 6X4 10 (ENDOMECHANICALS) ×1
CABLE HIGH FREQUENCY MONO STRZ (ELECTRODE) ×2 IMPLANT
CANISTER SUCTION 2500CC (MISCELLANEOUS) ×2 IMPLANT
CAUTERY SURG HI TEMP FINE TP (MISCELLANEOUS) ×2 IMPLANT
CHLORAPREP W/TINT 26ML (MISCELLANEOUS) ×2 IMPLANT
CLIP APPLIE ROT 10 11.4 M/L (STAPLE) IMPLANT
CLOTH BEACON ORANGE TIMEOUT ST (SAFETY) ×2 IMPLANT
COVER SURGICAL LIGHT HANDLE (MISCELLANEOUS) ×2 IMPLANT
DECANTER SPIKE VIAL GLASS SM (MISCELLANEOUS) ×4 IMPLANT
DERMABOND ADVANCED (GAUZE/BANDAGES/DRESSINGS) ×1
DERMABOND ADVANCED .7 DNX12 (GAUZE/BANDAGES/DRESSINGS) ×1 IMPLANT
DRAPE C-ARM 42X72 X-RAY (DRAPES) ×2 IMPLANT
DRAPE LAPAROSCOPIC ABDOMINAL (DRAPES) ×2 IMPLANT
ELECT REM PT RETURN 9FT ADLT (ELECTROSURGICAL) ×2
ELECTRODE REM PT RTRN 9FT ADLT (ELECTROSURGICAL) ×1 IMPLANT
ENDOLOOP SUT PDS II  0 18 (SUTURE) ×1
ENDOLOOP SUT PDS II 0 18 (SUTURE) ×1 IMPLANT
GLOVE SURG SS PI 7.5 STRL IVOR (GLOVE) ×4 IMPLANT
GOWN STRL NON-REIN LRG LVL3 (GOWN DISPOSABLE) ×6 IMPLANT
GOWN STRL REIN XL XLG (GOWN DISPOSABLE) ×4 IMPLANT
KIT BASIN OR (CUSTOM PROCEDURE TRAY) ×2 IMPLANT
NS IRRIG 1000ML POUR BTL (IV SOLUTION) ×2 IMPLANT
PENCIL BUTTON HOLSTER BLD 10FT (ELECTRODE) ×2 IMPLANT
POUCH SPECIMEN RETRIEVAL 10MM (ENDOMECHANICALS) ×2 IMPLANT
SCISSORS LAP 5X35 DISP (ENDOMECHANICALS) ×2 IMPLANT
SET CHOLANGIOGRAPH MIX (MISCELLANEOUS) ×2 IMPLANT
SET IRRIG TUBING LAPAROSCOPIC (IRRIGATION / IRRIGATOR) ×2 IMPLANT
STRIP CLOSURE SKIN 1/2X4 (GAUZE/BANDAGES/DRESSINGS) IMPLANT
SUT MNCRL AB 4-0 PS2 18 (SUTURE) ×2 IMPLANT
SUT VICRYL 0 UR6 27IN ABS (SUTURE) ×2 IMPLANT
TOWEL OR 17X26 10 PK STRL BLUE (TOWEL DISPOSABLE) ×2 IMPLANT
TRAY LAP CHOLE (CUSTOM PROCEDURE TRAY) ×2 IMPLANT
TROCAR BALLN 12MMX100 BLUNT (TROCAR) ×2 IMPLANT
TROCAR BLADELESS OPT 5 75 (ENDOMECHANICALS) ×4 IMPLANT
TROCAR XCEL NON-BLD 11X100MML (ENDOMECHANICALS) ×2 IMPLANT
TUBING INSUFFLATION 10FT LAP (TUBING) ×2 IMPLANT

## 2012-03-16 NOTE — H&P (Signed)
    Paula Kim is an 56 y.o. female.  HPI: she is her for cholecystectomy for tx of abdominal pain and cholelithiasis.  See my note from 4/13.  She denies any change in her health since then and says that she is experiencing gasiness.  I have recommended evaluation by Dr. Loreta Ave but she refused.  Past Medical History  Diagnosis Date  . Hyperlipidemia   . PONV (postoperative nausea and vomiting)   . Shortness of breath     WITH EXERTION  . Stroke     2009 - NO RESIDUAL PROBLEMS  . Gallstones   . Nocturia   . Anemia     AS TEENAGER  . Depression   . 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 Tubal ligation     Family History  Problem Relation Age of Onset  . COPD Father   . Cancer Maternal Grandfather     colon    Social History:  reports that she quit smoking about 6 years ago. She does not have any smokeless tobacco history on file. She reports that she does not drink alcohol or use illicit drugs.  Allergies:  Allergies  Allergen Reactions  . Erythromycin Swelling and Rash    Medications: I have reviewed the patient's current medications.  Results for orders placed during the hospital encounter of 03/16/12 (from the past 48 hour(s))  BASIC METABOLIC PANEL     Status: Abnormal   Collection Time   03/16/12  6:09 AM      Component Value Range Comment   Sodium 136  135 - 145 mEq/L    Potassium 3.6  3.5 - 5.1 mEq/L    Chloride 103  96 - 112 mEq/L    CO2 24  19 - 32 mEq/L    Glucose, Bld 110 (*) 70 - 99 mg/dL    BUN 9  6 - 23 mg/dL    Creatinine, Ser 1.61  0.50 - 1.10 mg/dL    Calcium 9.3  8.4 - 09.6 mg/dL    GFR calc non Af Amer >90  >90 mL/min    GFR calc Af Amer >90  >90 mL/min     No results found.  Blood pressure 133/97, pulse 79, temperature 96.7 F (35.9 C), temperature source Oral, resp. rate 18, SpO2 98.00%. General appearance: alert, cooperative and no  distress Resp: clear to auscultation bilaterally Cardio: regular rate and rhythm, S1, S2 normal, no murmur, click, rub or gallop GI: soft, non-tender; bowel sounds normal; no masses,  no organomegaly Extremities: extremities normal, atraumatic, no cyanosis or edema Skin: Skin color, texture, turgor normal. No rashes or lesions  Assessment/Plan: Cholelithiasis and abdominal pain She has gallstones but her pain and symptoms are not classic.  I again discussed with her the options for GI eval and recommended this but she would like to proceed with cholecystectomy. I discussed with her the risk of persistent symptoms postop especially since her symptoms were atypical and she expressed understanding. The risks of infection, bleeding, pain, persistent symptoms, scarring, injury to bowel or bile ducts, retained stone, diarrhea, need for additional procedures, and need for open surgery discussed with the patient. She desires to proceed with lap chole, IOC, possible open    Katie Moch DAVID 03/16/2012, 7:22 AM

## 2012-03-16 NOTE — Progress Notes (Signed)
Pt up and ambulated to bathroom with standby assistance.  Pt voided moderate amount clear yellow urine.

## 2012-03-16 NOTE — Transfer of Care (Signed)
Immediate Anesthesia Transfer of Care Note  Patient: Paula Kim  Procedure(s) Performed: Procedure(s) (LRB): LAPAROSCOPIC CHOLECYSTECTOMY WITH INTRAOPERATIVE CHOLANGIOGRAM (N/A)  Patient Location: PACU  Anesthesia Type: General  Level of Consciousness: awake and sedated  Airway & Oxygen Therapy: Patient Spontanous Breathing and Patient connected to face mask oxygen  Post-op Assessment: Report given to PACU RN, Post -op Vital signs reviewed and stable and has to void - placed on bedpan.  Post vital signs: Reviewed and stable  Complications: No apparent anesthesia complications

## 2012-03-16 NOTE — Progress Notes (Signed)
UR complete 

## 2012-03-16 NOTE — Brief Op Note (Signed)
03/16/2012  9:12 AM  PATIENT:  Paula Kim  56 y.o. female  PRE-OPERATIVE DIAGNOSIS:  to remove gallbladder  POST-OPERATIVE DIAGNOSIS:  abdominal pain  PROCEDURE:  Procedure(s) (LRB): LAPAROSCOPIC CHOLECYSTECTOMY WITH INTRAOPERATIVE CHOLANGIOGRAM (N/A)  SURGEON:  Surgeon(s) and Role:    * Lodema Pilot, DO - Primary    * Velora Heckler, MD - Assisting  PHYSICIAN ASSISTANT:   ASSISTANTS: Gerkin   ANESTHESIA:   general  EBL:  Total I/O In: 1000 [I.V.:1000] Out: -   BLOOD ADMINISTERED:none  DRAINS: none   LOCAL MEDICATIONS USED:  MARCAINE    and LIDOCAINE   SPECIMEN:  Source of Specimen:  gallbladder  DISPOSITION OF SPECIMEN:  PATHOLOGY  COUNTS:  YES  TOURNIQUET:  * No tourniquets in log *  DICTATION: .Other Dictation: Dictation Number [id number]  PLAN OF CARE: awaiting GI to determine if ERCP to be done  PATIENT DISPOSITION:  PACU - hemodynamically stable.   Delay start of Pharmacological VTE agent (>24hrs) due to surgical blood loss or risk of bleeding: no

## 2012-03-16 NOTE — Anesthesia Postprocedure Evaluation (Signed)
  Anesthesia Post-op Note  Patient: Paula Kim  Procedure(s) Performed: Procedure(s) (LRB): LAPAROSCOPIC CHOLECYSTECTOMY WITH INTRAOPERATIVE CHOLANGIOGRAM (N/A)  Patient Location: PACU  Anesthesia Type: General  Level of Consciousness: oriented and sedated  Airway and Oxygen Therapy: Patient Spontanous Breathing  Post-op Pain: mild  Post-op Assessment: Post-op Vital signs reviewed, Patient's Cardiovascular Status Stable, Respiratory Function Stable and Patent Airway  Post-op Vital Signs: stable  Complications: No apparent anesthesia complications

## 2012-03-16 NOTE — Op Note (Signed)
[facility] [address] [place], [place]  [address]  ERCP PROCEDURE REPORT  PATIENT:  Paula, Kim  MR#:  [id number] BIRTHDATE:  24-Jul-1956  GENDER:  female ENDOSCOPIST:  Jeani Hawking, MD ASSISTANT:  Tyrone Nine, RN CGRN PROCEDURE DATE:  03/16/2012 PROCEDURE:  ERCP with removal of stones, ERCP with sphincterotomy ASA CLASS:  Class II INDICATIONS:  stone MEDICATIONS:   Fentanyl 125 mcg IV, Versed 10 mg IV, glucagon 0.5 mg IV TOPICAL ANESTHETIC:  DESCRIPTION OF PROCEDURE:   After the risks benefits and alternatives of the procedure were thoroughly explained, informed consent was obtained.  The  endoscope was introduced through the mouth and advanced to the second portion of the duodenum.  The ampulla was noted to be adjacent to diverticula. The PD was cannulated multiple times and at one point a secondary wire was used in an attempt to cannulate the CBD. This did not work.  The CBD was able to be cannulated with manuvering the the sphincterotome. Contrast injection revealed a dilated CBD and there was a 1 cm stone in the proximal CBD. An 8 mm sphincterotomy was created, however, a blood vessel was nicked. Some bleeding occurred, but it was able to be controlled with spaying epinephrine and injection of the ampulla. The stone was able to be extracted with a balloon catheter. The final occlusion cholangiogram was negative for any retained stones. At this point the procedure was terminated.    The scope was then completely withdrawn from the patient and the procedure terminated. <<PROCEDUREIMAGES>>  COMPLICATIONS:  None  ENDOSCOPIC IMPRESSION: 1) Successful stone extration. RECOMMENDATIONS: 1) Routine post-operative care.  ______________________________ Jeani Hawking, MD  n. Rosalie DoctorJeani Hawking at 03/16/2012 02:25 PM  Elenore Rota, [phone]

## 2012-03-16 NOTE — Consult Note (Signed)
Reason for Consult:Choledocholithiasis Referring Physician: Lodema Pilot, D.O.  Floy Sabina HPI: This is a 56 year old female with ABM pain that was intermittent for many years, but lately it worsened to the point that she presented to the ER.  An ultrasound was performed and it revealed cholelithiasis.  She reports having post prandial abdominal pain and typically it would resolve.  Dr. Biagio Quint evaluated the patient and she elected to have a cholecystectomy.  The surgery was performed today and there was a finding of a stone in the CBD.  The CBD was also noted to be markedly dilated measuring 1.5 - 2 cm.  As a result of the findings a GI consultation was requested.  Past Medical History  Diagnosis Date  . Hyperlipidemia   . PONV (postoperative nausea and vomiting)   . Shortness of breath     WITH EXERTION  . Stroke     2009 - NO RESIDUAL PROBLEMS  . Gallstones   . Nocturia   . Anemia     AS TEENAGER  . Depression   . 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 Tubal ligation     Family History  Problem Relation Age of Onset  . COPD Father   . Cancer Maternal Grandfather     colon    Social History:  reports that she quit smoking about 6 years ago. She does not have any smokeless tobacco history on file. She reports that she does not drink alcohol or use illicit drugs.  Allergies:  Allergies  Allergen Reactions  . Erythromycin Swelling and Rash    Medications:  Scheduled:   .  ceFAZolin (ANCEF) IV  2 g Intravenous 60 min Pre-Op  . fentaNYL       Continuous:   . lactated ringers      Results for orders placed during the hospital encounter of 03/16/12 (from the past 24 hour(s))  BASIC METABOLIC PANEL     Status: Abnormal   Collection Time   03/16/12  6:09 AM      Component Value Range   Sodium 136  135 - 145 mEq/L   Potassium 3.6  3.5 - 5.1 mEq/L   Chloride 103  96 - 112  mEq/L   CO2 24  19 - 32 mEq/L   Glucose, Bld 110 (*) 70 - 99 mg/dL   BUN 9  6 - 23 mg/dL   Creatinine, Ser 4.54  0.50 - 1.10 mg/dL   Calcium 9.3  8.4 - 09.8 mg/dL   GFR calc non Af Amer >90  >90 mL/min   GFR calc Af Amer >90  >90 mL/min     No results found.  ROS:  As stated above in the HPI otherwise negative.  Blood pressure 148/72, pulse 89, temperature 98.1 F (36.7 C), temperature source Oral, resp. rate 12, SpO2 94.00%.    PE: Gen: NAD, Alert and Oriented HEENT:  [place]/AT, EOMI Neck: Supple, no LAD Lungs: CTA Bilaterally CV: RRR without M/G/R ABM: Soft, tender at the incision sites, +BS Ext: No C/C/E  Assessment/Plan: 1) Choledocholithiasis  Plan: 1) ERCP today.  She is still groggy from the anesthesia, but given the findings and her history a the ERCP is deemed necessary at this point to resolve her issues.  I did discuss the risks of the procedure, namely post ERCP pancreatitis.  Asiah Browder D 03/16/2012, 11:55  AM

## 2012-03-16 NOTE — Progress Notes (Signed)
Pt is resting comfortably.  Pt has no complaints.

## 2012-03-16 NOTE — Anesthesia Preprocedure Evaluation (Addendum)
Anesthesia Evaluation  Patient identified by MRN, date of birth, ID band Patient awake    Reviewed: Allergy & Precautions, H&P , NPO status , Patient's Chart, lab work & pertinent test results, reviewed documented beta blocker date and time   Airway Mallampati: II TM Distance: >3 FB Neck ROM: Full    Dental  (+) Upper Dentures and Lower Dentures   Pulmonary former smoker breath sounds clear to auscultation        Cardiovascular negative cardio ROS  Rhythm:Regular Rate:Normal  Denies cardiac symptoms   Neuro/Psych CVA, No Residual Symptoms negative psych ROS   GI/Hepatic Neg liver ROS, gallstones   Endo/Other  negative endocrine ROS  Renal/GU negative Renal ROS  negative genitourinary   Musculoskeletal negative musculoskeletal ROS (+)   Abdominal   Peds negative pediatric ROS (+)  Hematology negative hematology ROS (+)   Anesthesia Other Findings   Reproductive/Obstetrics negative OB ROS                          Anesthesia Physical Anesthesia Plan  ASA: III  Anesthesia Plan: General   Post-op Pain Management:    Induction: Intravenous  Airway Management Planned: Oral ETT  Additional Equipment:   Intra-op Plan:   Post-operative Plan: Extubation in OR  Informed Consent: I have reviewed the patients History and Physical, chart, labs and discussed the procedure including the risks, benefits and alternatives for the proposed anesthesia with the patient or authorized representative who has indicated his/her understanding and acceptance.     Plan Discussed with: CRNA and Surgeon  Anesthesia Plan Comments:        Anesthesia Quick Evaluation

## 2012-03-17 ENCOUNTER — Other Ambulatory Visit (INDEPENDENT_AMBULATORY_CARE_PROVIDER_SITE_OTHER): Payer: Self-pay | Admitting: General Surgery

## 2012-03-17 LAB — COMPREHENSIVE METABOLIC PANEL
ALT: 102 U/L — ABNORMAL HIGH (ref 0–35)
AST: 50 U/L — ABNORMAL HIGH (ref 0–37)
Alkaline Phosphatase: 155 U/L — ABNORMAL HIGH (ref 39–117)
CO2: 25 mEq/L (ref 19–32)
Calcium: 8.9 mg/dL (ref 8.4–10.5)
GFR calc Af Amer: >90 mL/min (ref >90)
Glucose, Bld: 128 mg/dL — ABNORMAL HIGH (ref 70–99)
Potassium: 3.7 mEq/L (ref 3.5–5.1)
Sodium: 137 mEq/L (ref 135–145)
Total Protein: 6.8 g/dL (ref 6.0–8.3)

## 2012-03-17 MED ORDER — TRAMADOL HCL 50 MG PO TABS: 50.0000 mg | ORAL_TABLET | Freq: Four times a day (QID) | ORAL | Status: AC | PRN

## 2012-03-17 NOTE — Progress Notes (Signed)
Assessment unchanged. Verbalized understanding of dc instructions. No scripts. Discharged via wc to front entrance to meet awaiting vehicle to carry home. Accompanied by NT and granddaughter.

## 2012-03-17 NOTE — Progress Notes (Signed)
1 Day Post-Op  Subjective: Feels good.  Has had two meals with no pain or nausea.  Objective: Vital signs in last 24 hours: Temp:  [98 F (36.7 C)-98.7 F (37.1 C)] 98.7 F (37.1 C) (06/22 1423) Pulse Rate:  [72-94] 84  (06/22 1423) Resp:  [18] 18  (06/22 1423) BP: (117-164)/(57-91) 149/88 mmHg (06/22 1423) SpO2:  [92 %-99 %] 99 % (06/22 1423) Last BM Date: 03/16/12  Intake/Output from previous day: 06/21 0701 - 06/22 0700 In: 3015 [I.V.:3015] Out: 1100 [Urine:1100] Intake/Output this shift: Total I/O In: 360 [P.O.:360] Out: 800 [Urine:800]  PE: Abd-soft, nontender, incisions clean and intact  Lab Results:  No results found for this basename: WBC:2,HGB:2,HCT:2,PLT:2 in the last 72 hours BMET  Basename 03/17/12 0440 03/16/12 0609  NA 137 136  K 3.7 3.6  CL 103 103  CO2 25 24  GLUCOSE 128* 110*  BUN 7 9  CREATININE 0.48* 0.61  CALCIUM 8.9 9.3   PT/INR No results found for this basename: LABPROT:2,INR:2 in the last 72 hours Comprehensive Metabolic Panel:    Component Value Date/Time   NA 137 03/17/2012 0440   K 3.7 03/17/2012 0440   CL 103 03/17/2012 0440   CO2 25 03/17/2012 0440   BUN 7 03/17/2012 0440   CREATININE 0.48* 03/17/2012 0440   GLUCOSE 128* 03/17/2012 0440   CALCIUM 8.9 03/17/2012 0440   AST 50* 03/17/2012 0440   ALT 102* 03/17/2012 0440   ALKPHOS 155* 03/17/2012 0440   BILITOT 0.7 03/17/2012 0440   PROT 6.8 03/17/2012 0440   ALBUMIN 3.2* 03/17/2012 0440     Studies/Results: Dg Ercp With Sphincterotomy  03/16/2012  *RADIOLOGY REPORT*  Clinical Data: Cholelithiasis and biliary ductal dilation on recent ultrasound.  ERCP 03/16/2012:  Comparison:  Abdominal ultrasound 12/29/2011.  Technique:  Multiple spot images were obtained with the fluoroscopic device and submitted for interpretation post- procedure.  ERCP was performed by Dr. Elnoria Howard.  Findings: Six spot images from the C-arm fluoroscopic device were submitted for interpretation post-procedure.  Prior  cholecystectomy.  Moderate dilation of the common bile duct, common hepatic duct, and left main intrahepatic bile duct.  Filling defect within the common hepatic duct consistent with a gallstone.  Dr. Elnoria Howard performed sphincterotomy and removed the stone.  Completion image shows no residual filling defect.  The radiologic technologist documented 5.1 minutes of fluoroscopy time.  IMPRESSION: Common bile duct stone which was removed by Dr. Elnoria Howard.  These images were submitted for radiologic interpretation only. Please see the procedural report for the amount of contrast and the fluoroscopy time utilized.  Original Report Authenticated By: Arnell Sieving, M.D.    Anti-infectives: Anti-infectives     Start     Dose/Rate Route Frequency Ordered Stop   03/16/12 0546   ceFAZolin (ANCEF) IVPB 2 g/50 mL premix        2 g 100 mL/hr over 30 Minutes Intravenous 60 min pre-op 03/16/12 0546 03/16/12 0740          Assessment Active Problems:  Cholelithiasis and choledocholithiasis s/p lap chole and ERCP 6/21- Some elevation of LFTS and lipase but she is doing well clinically.  I spoke with Dr. Loreta Ave (GI) who felt it was okay to send her home.    LOS: 1 day   Plan: Discharge.  Instructions given.   Paula Kim Paula Kim 03/17/2012

## 2012-03-17 NOTE — Discharge Instructions (Signed)
Laparoscopic Cholecystectomy Care After Refer to this sheet in the next few weeks. These instructions provide you with information on caring for yourself after your procedure. Your caregiver may also give you more specific instructions. Your treatment has been planned according to current medical practices, but problems sometimes occur. Call your caregiver if you have any problems or questions after your procedure. HOME CARE INSTRUCTIONS   Change bandages (dressings) as directed by your caregiver.   Keep the wound dry and clean.   You may shower 03/18/12.  Do not take baths or use swimming pools or hot tubs for 10 days, or as instructed by your caregiver.   Only take over-the-counter or prescription medicines for pain, discomfort, or fever as directed by your caregiver.   Continue your normal diet as directed by your caregiver.   Do not lift anything heavier than 10 pounds for 2 weeks.  Do not play contact sports for 1 week, or as directed by your caregiver.   Strict lowfat diet.  Appointment with Dr. Biagio Quint in 2 weeks.  Call the office to make this appointment at [phone] during normal business hours. SEEK MEDICAL CARE IF:   There is redness, swelling, or increasing pain in the wound.   You notice yellowish-white fluid (pus) coming from the wound.   There is drainage from the wound that lasts longer than 1 day.   There is a bad smell coming from the wound or dressing.   The surgical cut (incision) breaks open.  SEEK IMMEDIATE MEDICAL CARE IF:   You develop a rash.   You have difficulty breathing.   You develop chest pain.   You develop any reaction or side effects to medicines given.   You have a fever greater than 101.5.  You have increasing pain in the shoulders (shoulder strap areas).   You have dizzy episodes or faint while standing.   You develop severe abdominal pain.   You feel sick to your stomach (nauseous) or throw up (vomit) and this lasts for more than 1  day.  MAKE SURE YOU:   Understand these instructions.   Will watch your condition.   Will get help right away if you are not doing well or get worse.  Document Released: 09/12/2005 Document Revised: 09/01/2011 Document Reviewed: 02/25/2011 [facility] Patient Information 2012 [place], [place].

## 2012-03-17 NOTE — Op Note (Signed)
NAME:  Paula Kim, Paula Kim             ACCOUNT NO.:  [id number]  MEDICAL RECORD NO.:  [id number]  LOCATION:  [facility]                         FACILITY:  [facility]  PHYSICIAN:  Lodema Pilot, MD       DATE OF BIRTH:  Feb 16, 1956  DATE OF PROCEDURE:  03/16/2012 DATE OF DISCHARGE:                              OPERATIVE REPORT   PROCEDURE:  Laparoscopic cholecystectomy with intraoperative cholangiogram.  PREOPERATIVE DIAGNOSIS:  Abdominal pain.  POSTOPERATIVE DIAGNOSIS:  Abdominal pain and common bile duct stones.  SURGEON:  Lodema Pilot, MD  ASSISTANT:  Velora Heckler, MD  ANESTHESIA:  General anesthesia with 1% lidocaine and 0.25% Marcaine with epinephrine in a 50:50 mixture.  FLUIDS:  One liter of crystalloid.  ESTIMATED BLOOD LOSS:  Minimal.  DRAINS:  None.  SPECIMENS:  Gallbladder and contents sent to pathology for permanent section.  COMPLICATIONS:  None apparent.  FINDINGS:  Mildly thickened gallbladder.  She did have some small stones which were milked back for retrograde through the cystic duct, abnormal cholangiogram with apparent filling defects in the common bile duct concerning for common bile duct stone, although she did have free flow of bile into the duodenum.  OPERATIVE DETAILS:  Paula Kim was seen and evaluated in the preop area and risks and benefits of the procedure were discussed in lay terms. Informed consent was obtained, but again discussed with her the risk of persistent symptoms after procedure given her atypical symptoms.  She expressed understanding and desired to proceed with cholecystectomy. She was given prophylactic antibiotics and taken to the operating room, placed on the table in supine position and general endotracheal tube anesthesia was obtained.  Her abdomen was prepped and draped in a standard surgical fashion.  Procedure time-out was performed with all operative team members to confirm proper patient, procedure.  Then a supraumbilical midline  incision was made in the skin and dissection carried down through the abdominal wall.  Using blunt dissection, abdominal wall fascia was elevated and sharply incised.  The peritoneum was entered directly.  A 12 mm balloon port was placed and pneumoperitoneum was obtained.  Laparoscope was introduced and there was no evidence of bowel injury upon entry.  Two 5 mm right upper quadrant trocars were placed under direct visualization and a 11 mm epigastric trocar was placed under direct visualization.  The gallbladder was retracted cephalad and it did appear slightly thickened, chronically inflamed but no evidence of acute cholecystitis.  The peritoneum was taken down from the gallbladder using blunt dissection.  Cystic artery was identified and clipped between hemoclips with 2 clips on the stay side, however, it was not divided at this time and the cystic duct was skeletonized and a critical view of safety was obtained, visualizing the liver parenchyma through the triangle of Calot with a single cystic duct and a single cystic artery.  The cystic duct was very dilated and thick. A clip was placed on the gallbladder side and a small cystic ductotomy was made and a cholangiogram catheter passed through the abdominal wall. A cholangiogram was performed.  This demonstrated very dilated cystic duct, dilated common bile duct, and dilated intrahepatic ducts and right and left hepatic ducts as well.  She appeared to have a filling defect in the distal common bile duct.  Although she did, I was able to get flow of contrast into the duodenum.  This was concerning for possible common bile duct stone.  The cholangiogram catheter was removed and 2 clips were placed on the cystic duct and a 0 PDS Endoloop was placed on the cystic duct stump and cystic artery which was already clipped and divided.  The gallbladder was then removed from the gallbladder fossa using Bovie electrocautery and the gallbladder was  not entered during the dissection.  The gallbladder was completely removed and placed in an EndoCatch bag and removed from the umbilical trocar site.  I did not palpate any stones within the gallbladder after it was removed.  It was sent to pathology for permanent section.  The gallbladder fossa was inspected for hemostasis which was noted to be adequate and the right upper quadrant was irrigated with sterile saline solution.  The irrigation returned clear.  The clips and Endoloop appeared to be in good position.  There was no evidence of bleeding or bowel injury in the right upper quadrant and umbilical trocar site.  The umbilical trocar was removed and the fascia was approximated with interrupted 0 Vicryl sutures in open fashion and the abdomen was re-insufflated with carbon dioxide gas and the abdominal wall closure was noted to be adequate without any evidence of bowel injury.  The trocars removed and the abdominal wall was noted to be hemostatic and the skin incisions were injected with 25 mL of 1% lidocaine and 0.25% Marcaine with epinephrine in a 50:50 mixture in the skin edges.  The skin was wound was closed with 4-0 Monocryl subcuticular suture.  Skin was washed and dried. Dermabond was applied.  All sponge, needle, and instrument counts were correct at the end of the case.  The patient tolerated the procedure well without apparent complications.  She was transferred to the PACU where I called her gastroenterologist, Dr. Loreta Ave regarding her cholangiogram findings.          ______________________________ Lodema Pilot, MD     BL/MEDQ  D:  03/16/2012  T:  03/16/2012  Job:  [id number]

## 2012-03-19 ENCOUNTER — Encounter (HOSPITAL_COMMUNITY): Payer: Self-pay | Admitting: Gastroenterology

## 2012-04-02 NOTE — Discharge Summary (Signed)
Physician Discharge Summary  Patient ID: Paula Kim MRN: [id number] DOB/AGE: [birth year] 56 y.o.  Admit date: 03/16/2012 Discharge date: 04/02/2012  Admission Diagnoses:  Discharge Diagnoses:  Active Problems:  * No active hospital problems. *    Discharged Condition: good  Hospital Course: to OR for elective lap cholecystectomy/IOC.  She had an abnormal cholangiogram and so GI was consulted.  ERCP performed later that day and she was kept for overnight observation.  She was ready for discharge home on POD 1.  Consults: GI  Significant Diagnostic Studies: ercp  Treatments: surgery: lap cholecystectomy/IOC, and ERCP  Disposition: 01-Home or Self Care  Discharge Orders    Future Appointments: Provider: Department: Dept Phone: Center:   04/19/2012 10:30 AM Lodema Pilot, DO [facility] [phone] None     Medication List  As of 04/02/2012 10:23 AM   TAKE these medications         aspirin EC 325 MG tablet   Take 325 mg by mouth daily.      famotidine 20 MG tablet   Commonly known as: PEPCID   Take 20 mg by mouth 2 (two) times daily.      fenofibrate micronized 200 MG capsule   Commonly known as: LOFIBRA   Take 200 mg by mouth daily.      fish oil-omega-3 fatty acids 1000 MG capsule   Take 1 g by mouth 2 (two) times daily.      methylcellulose 1 % ophthalmic solution   Commonly known as: ARTIFICIAL TEARS   Place 1 drop into both eyes as needed. Dry eyes      niacin 500 MG tablet   Commonly known as: SLO-NIACIN   Take 500 mg by mouth 2 (two) times daily with a meal.      pravastatin 40 MG tablet   Commonly known as: PRAVACHOL   Take 40 mg by mouth daily.      traMADol 50 MG tablet   Commonly known as: ULTRAM   Take 1 tablet (50 mg total) by mouth every 6 (six) hours as needed for pain.      venlafaxine XR 150 MG 24 hr capsule   Commonly known as: EFFEXOR-XR   Take 150 mg by mouth daily.      vitamin B-12 500 MCG tablet   Commonly known as:  CYANOCOBALAMIN   Take 500 mcg by mouth 2 (two) times daily.      zolpidem 10 MG tablet   Commonly known as: AMBIEN   Take 10 mg by mouth at bedtime as needed. For sleep.             SignedLodema Pilot DAVID 04/02/2012, 10:23 AM

## 2012-04-19 ENCOUNTER — Ambulatory Visit (INDEPENDENT_AMBULATORY_CARE_PROVIDER_SITE_OTHER): Payer: 59 | Admitting: General Surgery

## 2012-04-19 ENCOUNTER — Encounter (INDEPENDENT_AMBULATORY_CARE_PROVIDER_SITE_OTHER): Payer: Self-pay | Admitting: General Surgery

## 2012-04-19 VITALS — BP 134/86 | HR 71 | Temp 97.1°F | Resp 16 | Ht 61.0 in | Wt 158.4 lb

## 2012-04-19 DIAGNOSIS — Z4889 Encounter for other specified surgical aftercare: Secondary | ICD-10-CM

## 2012-04-19 DIAGNOSIS — Z5189 Encounter for other specified aftercare: Secondary | ICD-10-CM

## 2012-04-19 NOTE — Progress Notes (Signed)
Subjective:     Patient ID: Paula Kim, female   DOB: 1956/07/15, 56 y.o.   MRN: [id number]  HPI This patient follows up status post-laparoscopic cholecystectomy for possible relief of her abdominal pain. She says that she feels remarkably well. She has not had any appears preoperative symptoms since her surgery and she says even her reflux has improved. She has not had any nausea or vomiting and she has resumed taking her usual diet including some fatty foods and has not had any issues. She has had some occasional loose stools which he says is actually less than she had anticipated. She has no complaints.  Review of Systems     Objective:   Physical Exam No distress and nontoxic-appearing Her abdomen is soft and nontender on exam and her incisions are well-healed without signs of infection. There is no evidence of hernia.    Assessment:     Status post laparoscopic cholecystectomy-doing well Her incisions are healing well and there is no sign of infection. There is no evidence of postoperative complications. Her pathology was benign. Her symptoms have completely resolved and she feels much better. I have no resections 4 and she can resume regular diet and regular activity. I did recommend that she follow up with Dr. Loreta Ave for followup colonoscopy.    Plan:     She can follow up with [facility] on a p.r.n. basis and follow up with Dr. Loreta Ave for repeat colonoscopy.

## 2014-05-07 ENCOUNTER — Other Ambulatory Visit: Payer: Self-pay | Admitting: Obstetrics and Gynecology

## 2014-05-08 LAB — CYTOLOGY - PAP

## 2014-11-18 ENCOUNTER — Other Ambulatory Visit: Payer: Self-pay | Admitting: Obstetrics and Gynecology

## 2014-11-19 LAB — CYTOLOGY - PAP

## 2015-06-17 ENCOUNTER — Other Ambulatory Visit: Payer: Self-pay | Admitting: Obstetrics and Gynecology

## 2015-06-18 LAB — CYTOLOGY - PAP

## 2019-11-14 ENCOUNTER — Inpatient Hospital Stay (HOSPITAL_COMMUNITY): Payer: Self-pay

## 2019-11-14 ENCOUNTER — Encounter (HOSPITAL_COMMUNITY): Admission: EM | Disposition: E | Payer: Self-pay | Source: Home / Self Care | Attending: Neurology

## 2019-11-14 ENCOUNTER — Inpatient Hospital Stay (HOSPITAL_COMMUNITY): Payer: Self-pay | Admitting: Anesthesiology

## 2019-11-14 ENCOUNTER — Emergency Department (HOSPITAL_COMMUNITY): Payer: Self-pay

## 2019-11-14 ENCOUNTER — Encounter (HOSPITAL_COMMUNITY): Payer: Self-pay | Admitting: Pharmacy Technician

## 2019-11-14 ENCOUNTER — Inpatient Hospital Stay: Payer: Self-pay

## 2019-11-14 ENCOUNTER — Inpatient Hospital Stay (HOSPITAL_COMMUNITY)
Admission: EM | Admit: 2019-11-14 | Discharge: 2019-12-26 | DRG: 023 | Disposition: E | Payer: Self-pay | Attending: Neurology | Admitting: Neurology

## 2019-11-14 ENCOUNTER — Other Ambulatory Visit: Payer: Self-pay

## 2019-11-14 DIAGNOSIS — I700 Atherosclerosis of aorta: Secondary | ICD-10-CM | POA: Diagnosis present

## 2019-11-14 DIAGNOSIS — D62 Acute posthemorrhagic anemia: Secondary | ICD-10-CM | POA: Diagnosis not present

## 2019-11-14 DIAGNOSIS — Z825 Family history of asthma and other chronic lower respiratory diseases: Secondary | ICD-10-CM

## 2019-11-14 DIAGNOSIS — E041 Nontoxic single thyroid nodule: Secondary | ICD-10-CM | POA: Diagnosis present

## 2019-11-14 DIAGNOSIS — G8194 Hemiplegia, unspecified affecting left nondominant side: Secondary | ICD-10-CM | POA: Diagnosis present

## 2019-11-14 DIAGNOSIS — R4020 Unspecified coma: Secondary | ICD-10-CM | POA: Diagnosis not present

## 2019-11-14 DIAGNOSIS — R1310 Dysphagia, unspecified: Secondary | ICD-10-CM | POA: Diagnosis present

## 2019-11-14 DIAGNOSIS — E785 Hyperlipidemia, unspecified: Secondary | ICD-10-CM | POA: Diagnosis present

## 2019-11-14 DIAGNOSIS — J95851 Ventilator associated pneumonia: Secondary | ICD-10-CM | POA: Diagnosis not present

## 2019-11-14 DIAGNOSIS — R471 Dysarthria and anarthria: Secondary | ICD-10-CM | POA: Diagnosis present

## 2019-11-14 DIAGNOSIS — R4701 Aphasia: Secondary | ICD-10-CM | POA: Diagnosis present

## 2019-11-14 DIAGNOSIS — B9561 Methicillin susceptible Staphylococcus aureus infection as the cause of diseases classified elsewhere: Secondary | ICD-10-CM | POA: Diagnosis not present

## 2019-11-14 DIAGNOSIS — Z79899 Other long term (current) drug therapy: Secondary | ICD-10-CM

## 2019-11-14 DIAGNOSIS — J432 Centrilobular emphysema: Secondary | ICD-10-CM | POA: Diagnosis present

## 2019-11-14 DIAGNOSIS — Z0189 Encounter for other specified special examinations: Secondary | ICD-10-CM

## 2019-11-14 DIAGNOSIS — Z6827 Body mass index (BMI) 27.0-27.9, adult: Secondary | ICD-10-CM

## 2019-11-14 DIAGNOSIS — R000 Tachycardia, unspecified: Secondary | ICD-10-CM | POA: Diagnosis not present

## 2019-11-14 DIAGNOSIS — J9602 Acute respiratory failure with hypercapnia: Secondary | ICD-10-CM | POA: Diagnosis not present

## 2019-11-14 DIAGNOSIS — R29728 NIHSS score 28: Secondary | ICD-10-CM | POA: Diagnosis not present

## 2019-11-14 DIAGNOSIS — Z20822 Contact with and (suspected) exposure to covid-19: Secondary | ICD-10-CM | POA: Diagnosis present

## 2019-11-14 DIAGNOSIS — I639 Cerebral infarction, unspecified: Secondary | ICD-10-CM

## 2019-11-14 DIAGNOSIS — I63511 Cerebral infarction due to unspecified occlusion or stenosis of right middle cerebral artery: Principal | ICD-10-CM | POA: Diagnosis present

## 2019-11-14 DIAGNOSIS — I6389 Other cerebral infarction: Secondary | ICD-10-CM

## 2019-11-14 DIAGNOSIS — I712 Thoracic aortic aneurysm, without rupture: Secondary | ICD-10-CM | POA: Diagnosis present

## 2019-11-14 DIAGNOSIS — R739 Hyperglycemia, unspecified: Secondary | ICD-10-CM | POA: Diagnosis not present

## 2019-11-14 DIAGNOSIS — Z881 Allergy status to other antibiotic agents status: Secondary | ICD-10-CM

## 2019-11-14 DIAGNOSIS — I739 Peripheral vascular disease, unspecified: Secondary | ICD-10-CM | POA: Diagnosis present

## 2019-11-14 DIAGNOSIS — I714 Abdominal aortic aneurysm, without rupture: Secondary | ICD-10-CM | POA: Diagnosis present

## 2019-11-14 DIAGNOSIS — D72829 Elevated white blood cell count, unspecified: Secondary | ICD-10-CM

## 2019-11-14 DIAGNOSIS — Z800 Family history of malignant neoplasm of digestive organs: Secondary | ICD-10-CM

## 2019-11-14 DIAGNOSIS — Z87891 Personal history of nicotine dependence: Secondary | ICD-10-CM

## 2019-11-14 DIAGNOSIS — Y95 Nosocomial condition: Secondary | ICD-10-CM | POA: Diagnosis not present

## 2019-11-14 DIAGNOSIS — K5900 Constipation, unspecified: Secondary | ICD-10-CM | POA: Diagnosis not present

## 2019-11-14 DIAGNOSIS — F1910 Other psychoactive substance abuse, uncomplicated: Secondary | ICD-10-CM | POA: Diagnosis present

## 2019-11-14 DIAGNOSIS — F419 Anxiety disorder, unspecified: Secondary | ICD-10-CM | POA: Diagnosis not present

## 2019-11-14 DIAGNOSIS — R233 Spontaneous ecchymoses: Secondary | ICD-10-CM | POA: Diagnosis present

## 2019-11-14 DIAGNOSIS — H518 Other specified disorders of binocular movement: Secondary | ICD-10-CM | POA: Diagnosis present

## 2019-11-14 DIAGNOSIS — Z66 Do not resuscitate: Secondary | ICD-10-CM | POA: Diagnosis not present

## 2019-11-14 DIAGNOSIS — Z8673 Personal history of transient ischemic attack (TIA), and cerebral infarction without residual deficits: Secondary | ICD-10-CM

## 2019-11-14 DIAGNOSIS — G936 Cerebral edema: Secondary | ICD-10-CM | POA: Diagnosis present

## 2019-11-14 DIAGNOSIS — Z515 Encounter for palliative care: Secondary | ICD-10-CM | POA: Diagnosis not present

## 2019-11-14 DIAGNOSIS — E663 Overweight: Secondary | ICD-10-CM | POA: Diagnosis present

## 2019-11-14 DIAGNOSIS — J0100 Acute maxillary sinusitis, unspecified: Secondary | ICD-10-CM | POA: Diagnosis not present

## 2019-11-14 DIAGNOSIS — Z4659 Encounter for fitting and adjustment of other gastrointestinal appliance and device: Secondary | ICD-10-CM

## 2019-11-14 DIAGNOSIS — E876 Hypokalemia: Secondary | ICD-10-CM | POA: Diagnosis present

## 2019-11-14 DIAGNOSIS — F329 Major depressive disorder, single episode, unspecified: Secondary | ICD-10-CM | POA: Diagnosis present

## 2019-11-14 DIAGNOSIS — Z7982 Long term (current) use of aspirin: Secondary | ICD-10-CM

## 2019-11-14 DIAGNOSIS — R509 Fever, unspecified: Secondary | ICD-10-CM

## 2019-11-14 DIAGNOSIS — J9601 Acute respiratory failure with hypoxia: Secondary | ICD-10-CM | POA: Diagnosis present

## 2019-11-14 DIAGNOSIS — R578 Other shock: Secondary | ICD-10-CM | POA: Diagnosis not present

## 2019-11-14 DIAGNOSIS — R29810 Facial weakness: Secondary | ICD-10-CM | POA: Diagnosis present

## 2019-11-14 DIAGNOSIS — G9341 Metabolic encephalopathy: Secondary | ICD-10-CM | POA: Diagnosis not present

## 2019-11-14 HISTORY — PX: CRANIOTOMY: SHX93

## 2019-11-14 LAB — COMPREHENSIVE METABOLIC PANEL
ALT: 18 U/L (ref 0–44)
AST: 29 U/L (ref 15–41)
Albumin: 4.2 g/dL (ref 3.5–5.0)
Alkaline Phosphatase: 101 U/L (ref 38–126)
Anion gap: 15 (ref 5–15)
BUN: 12 mg/dL (ref 8–23)
CO2: 23 mmol/L (ref 22–32)
Calcium: 9.5 mg/dL (ref 8.9–10.3)
Chloride: 102 mmol/L (ref 98–111)
Creatinine, Ser: 0.77 mg/dL (ref 0.44–1.00)
GFR calc Af Amer: >60 mL/min (ref >60)
GFR calc non Af Amer: >60 mL/min (ref >60)
Glucose, Bld: 127 mg/dL — ABNORMAL HIGH (ref 70–99)
Potassium: 3.2 mmol/L — ABNORMAL LOW (ref 3.5–5.1)
Sodium: 140 mmol/L (ref 135–145)
Total Bilirubin: 1 mg/dL (ref 0.3–1.2)
Total Protein: 8.1 g/dL (ref 6.5–8.1)

## 2019-11-14 LAB — POCT I-STAT 7, (LYTES, BLD GAS, ICA,H+H)
Acid-base deficit: 1 mmol/L (ref 0.0–2.0)
Bicarbonate: 23.9 mmol/L (ref 20.0–28.0)
Calcium, Ion: 1.12 mmol/L — ABNORMAL LOW (ref 1.15–1.40)
HCT: 28 % — ABNORMAL LOW (ref 36.0–46.0)
Hemoglobin: 9.5 g/dL — ABNORMAL LOW (ref 12.0–15.0)
O2 Saturation: 98 %
Patient temperature: 98.4
Potassium: 2.7 mmol/L — CL (ref 3.5–5.1)
Sodium: 140 mmol/L (ref 135–145)
TCO2: 25 mmol/L (ref 22–32)
pCO2 arterial: 41 mmHg (ref 32.0–48.0)
pH, Arterial: 7.374 (ref 7.350–7.450)
pO2, Arterial: 114 mmHg — ABNORMAL HIGH (ref 83.0–108.0)

## 2019-11-14 LAB — CBC WITH DIFFERENTIAL/PLATELET
Abs Immature Granulocytes: 0.05 10*3/uL (ref 0.00–0.07)
Basophils Absolute: 0 10*3/uL (ref 0.0–0.1)
Basophils Relative: 0 %
Eosinophils Absolute: 0 10*3/uL (ref 0.0–0.5)
Eosinophils Relative: 0 %
HCT: 38 % (ref 36.0–46.0)
Hemoglobin: 13 g/dL (ref 12.0–15.0)
Immature Granulocytes: 1 %
Lymphocytes Relative: 9 %
Lymphs Abs: 1 10*3/uL (ref 0.7–4.0)
MCH: 30.6 pg (ref 26.0–34.0)
MCHC: 34.2 g/dL (ref 30.0–36.0)
MCV: 89.4 fL (ref 80.0–100.0)
Monocytes Absolute: 0.5 10*3/uL (ref 0.1–1.0)
Monocytes Relative: 4 %
Neutro Abs: 9.5 10*3/uL — ABNORMAL HIGH (ref 1.7–7.7)
Neutrophils Relative %: 86 %
Platelets: 259 10*3/uL (ref 150–400)
RBC: 4.25 MIL/uL (ref 3.87–5.11)
RDW: 11.9 % (ref 11.5–15.5)
WBC: 11.1 10*3/uL — ABNORMAL HIGH (ref 4.0–10.5)
nRBC: 0 % (ref 0.0–0.2)

## 2019-11-14 LAB — RAPID URINE DRUG SCREEN, HOSP PERFORMED
Amphetamines: NOT DETECTED
Barbiturates: NOT DETECTED
Benzodiazepines: NOT DETECTED
Cocaine: NOT DETECTED
Opiates: NOT DETECTED
Tetrahydrocannabinol: POSITIVE — AB

## 2019-11-14 LAB — URINALYSIS, ROUTINE W REFLEX MICROSCOPIC
Bilirubin Urine: NEGATIVE
Glucose, UA: NEGATIVE mg/dL
Hgb urine dipstick: NEGATIVE
Ketones, ur: 5 mg/dL — AB
Leukocytes,Ua: NEGATIVE
Nitrite: NEGATIVE
Protein, ur: NEGATIVE mg/dL
Specific Gravity, Urine: >1.046 — ABNORMAL HIGH (ref 1.005–1.030)
pH: 7 (ref 5.0–8.0)

## 2019-11-14 LAB — SODIUM
Sodium: 140 mmol/L (ref 135–145)
Sodium: 138 mmol/L (ref 135–145)

## 2019-11-14 LAB — TYPE AND SCREEN
ABO/RH(D): A POS
Antibody Screen: NEGATIVE

## 2019-11-14 LAB — ABO/RH: ABO/RH(D): A POS

## 2019-11-14 LAB — I-STAT CHEM 8, ED
BUN: 12 mg/dL (ref 8–23)
Calcium, Ion: 1.09 mmol/L — ABNORMAL LOW (ref 1.15–1.40)
Chloride: 104 mmol/L (ref 98–111)
Creatinine, Ser: 0.5 mg/dL (ref 0.44–1.00)
Glucose, Bld: 112 mg/dL — ABNORMAL HIGH (ref 70–99)
HCT: 41 % (ref 36.0–46.0)
Hemoglobin: 13.9 g/dL (ref 12.0–15.0)
Potassium: 3 mmol/L — ABNORMAL LOW (ref 3.5–5.1)
Sodium: 140 mmol/L (ref 135–145)
TCO2: 26 mmol/L (ref 22–32)

## 2019-11-14 LAB — CBG MONITORING, ED: Glucose-Capillary: 124 mg/dL — ABNORMAL HIGH (ref 70–99)

## 2019-11-14 LAB — RESPIRATORY PANEL BY RT PCR (FLU A&B, COVID)
Influenza A by PCR: NEGATIVE
Influenza B by PCR: NEGATIVE
SARS Coronavirus 2 by RT PCR: NEGATIVE

## 2019-11-14 LAB — MRSA PCR SCREENING: MRSA by PCR: NEGATIVE

## 2019-11-14 LAB — PROTIME-INR
INR: 1 (ref 0.8–1.2)
Prothrombin Time: 12.6 seconds (ref 11.4–15.2)

## 2019-11-14 LAB — ECHOCARDIOGRAM COMPLETE

## 2019-11-14 LAB — APTT: aPTT: 20 seconds — ABNORMAL LOW (ref 24–36)

## 2019-11-14 LAB — HIV ANTIBODY (ROUTINE TESTING W REFLEX): HIV Screen 4th Generation wRfx: NONREACTIVE

## 2019-11-14 LAB — ETHANOL: Alcohol, Ethyl (B): <10 mg/dL (ref <10)

## 2019-11-14 SURGERY — CRANIOTOMY HEMATOMA EVACUATION SUBDURAL
Anesthesia: General | Site: Head | Laterality: Right

## 2019-11-14 MED ORDER — ORAL CARE MOUTH RINSE
15.0000 mL | OROMUCOSAL | Status: DC
  Administered 2019-11-14 – 2019-11-25 (×105): 15 mL via OROMUCOSAL

## 2019-11-14 MED ORDER — ONDANSETRON HCL 4 MG/2ML IJ SOLN
INTRAMUSCULAR | Status: DC | PRN
  Administered 2019-11-14: 4 mg via INTRAVENOUS

## 2019-11-14 MED ORDER — OXYCODONE HCL 5 MG/5ML PO SOLN: 5.0000 mg | Freq: Once | ORAL | Status: DC | PRN

## 2019-11-14 MED ORDER — SODIUM CHLORIDE 0.9 % IV BOLUS
1000.0000 mL | Freq: Once | INTRAVENOUS | Status: AC
  Administered 2019-11-14: 1000 mL via INTRAVENOUS

## 2019-11-14 MED ORDER — SODIUM CHLORIDE 0.9 % IV SOLN: INTRAVENOUS | Status: DC | PRN

## 2019-11-14 MED ORDER — BACITRACIN ZINC 500 UNIT/GM EX OINT
TOPICAL_OINTMENT | CUTANEOUS | Status: AC
  Filled 2019-11-14: qty 28.35

## 2019-11-14 MED ORDER — LIDOCAINE 2% (20 MG/ML) 5 ML SYRINGE
INTRAMUSCULAR | Status: DC | PRN
  Administered 2019-11-14: 30 mg via INTRAVENOUS

## 2019-11-14 MED ORDER — THROMBIN 5000 UNITS EX SOLR
CUTANEOUS | Status: AC
  Filled 2019-11-14: qty 5000

## 2019-11-14 MED ORDER — THROMBIN 20000 UNITS EX SOLR
CUTANEOUS | Status: AC
  Filled 2019-11-14: qty 20000

## 2019-11-14 MED ORDER — CHLORHEXIDINE GLUCONATE 0.12% ORAL RINSE (MEDLINE KIT)
15.0000 mL | Freq: Two times a day (BID) | OROMUCOSAL | Status: DC
  Administered 2019-11-14 – 2019-11-25 (×22): 15 mL via OROMUCOSAL

## 2019-11-14 MED ORDER — POTASSIUM CHLORIDE 20 MEQ/15ML (10%) PO SOLN: 40.0000 meq | ORAL | Status: DC

## 2019-11-14 MED ORDER — ASPIRIN 300 MG RE SUPP
300.0000 mg | Freq: Every day | RECTAL | Status: DC
  Filled 2019-11-14: qty 1

## 2019-11-14 MED ORDER — HYDROCODONE-ACETAMINOPHEN 5-325 MG PO TABS: 1.0000 | ORAL_TABLET | ORAL | Status: DC | PRN

## 2019-11-14 MED ORDER — NALOXONE HCL 0.4 MG/ML IJ SOLN: 0.0800 mg | INTRAMUSCULAR | Status: DC | PRN

## 2019-11-14 MED ORDER — DEXMEDETOMIDINE HCL IN NACL 400 MCG/100ML IV SOLN
0.4000 ug/kg/h | INTRAVENOUS | Status: DC
  Administered 2019-11-14: 0.4 ug/kg/h via INTRAVENOUS
  Administered 2019-11-15: 0.3 ug/kg/h via INTRAVENOUS
  Administered 2019-11-16: 19:00:00 0.7 ug/kg/h via INTRAVENOUS
  Administered 2019-11-17 (×2): 0.8 ug/kg/h via INTRAVENOUS
  Administered 2019-11-18: 1.2 ug/kg/h via INTRAVENOUS
  Administered 2019-11-18: 0.8 ug/kg/h via INTRAVENOUS
  Administered 2019-11-18: 1.3 ug/kg/h via INTRAVENOUS
  Administered 2019-11-18 – 2019-11-19 (×2): 0.8 ug/kg/h via INTRAVENOUS
  Administered 2019-11-19 – 2019-11-20 (×2): 1 ug/kg/h via INTRAVENOUS
  Administered 2019-11-20: 03:00:00 0.9 ug/kg/h via INTRAVENOUS
  Administered 2019-11-20 – 2019-11-21 (×2): 1 ug/kg/h via INTRAVENOUS
  Administered 2019-11-21: 17:00:00 1.2 ug/kg/h via INTRAVENOUS
  Administered 2019-11-21: 1 ug/kg/h via INTRAVENOUS
  Administered 2019-11-21 (×2): 1.5 ug/kg/h via INTRAVENOUS
  Administered 2019-11-22: 0.8 ug/kg/h via INTRAVENOUS
  Administered 2019-11-22: 1 ug/kg/h via INTRAVENOUS
  Administered 2019-11-22: 04:00:00 1.5 ug/kg/h via INTRAVENOUS
  Administered 2019-11-22: 11:00:00 0.8 ug/kg/h via INTRAVENOUS
  Administered 2019-11-23 – 2019-11-25 (×8): 0.9 ug/kg/h via INTRAVENOUS
  Administered 2019-11-25: 1.2 ug/kg/h via INTRAVENOUS
  Filled 2019-11-14 (×7): qty 100
  Filled 2019-11-14 (×2): qty 200
  Filled 2019-11-14 (×4): qty 100
  Filled 2019-11-14: qty 200
  Filled 2019-11-14: qty 100
  Filled 2019-11-14: qty 200
  Filled 2019-11-14: qty 100
  Filled 2019-11-14: qty 200
  Filled 2019-11-14: qty 100
  Filled 2019-11-14: qty 200
  Filled 2019-11-14 (×10): qty 100

## 2019-11-14 MED ORDER — PHENYLEPHRINE 40 MCG/ML (10ML) SYRINGE FOR IV PUSH (FOR BLOOD PRESSURE SUPPORT)
PREFILLED_SYRINGE | INTRAVENOUS | Status: AC
  Filled 2019-11-14: qty 10

## 2019-11-14 MED ORDER — OXYCODONE HCL 5 MG PO TABS: 5.0000 mg | ORAL_TABLET | Freq: Once | ORAL | Status: DC | PRN

## 2019-11-14 MED ORDER — PROPOFOL 10 MG/ML IV BOLUS: INTRAVENOUS | Status: DC | PRN

## 2019-11-14 MED ORDER — LIDOCAINE 2% (20 MG/ML) 5 ML SYRINGE: INTRAMUSCULAR | Status: DC | PRN

## 2019-11-14 MED ORDER — PROPOFOL 10 MG/ML IV BOLUS
INTRAVENOUS | Status: DC | PRN
  Administered 2019-11-14: 50 mg via INTRAVENOUS
  Administered 2019-11-14: 150 ug via INTRAVENOUS

## 2019-11-14 MED ORDER — ONDANSETRON HCL 4 MG PO TABS: 4.0000 mg | ORAL_TABLET | ORAL | Status: DC | PRN

## 2019-11-14 MED ORDER — LABETALOL HCL 5 MG/ML IV SOLN
INTRAVENOUS | Status: DC | PRN
  Administered 2019-11-14 (×2): 10 mg via INTRAVENOUS

## 2019-11-14 MED ORDER — BACITRACIN ZINC 500 UNIT/GM EX OINT
TOPICAL_OINTMENT | CUTANEOUS | Status: DC | PRN
  Administered 2019-11-14: 1 via TOPICAL

## 2019-11-14 MED ORDER — ONDANSETRON HCL 4 MG/2ML IJ SOLN: 4.0000 mg | INTRAMUSCULAR | Status: DC | PRN

## 2019-11-14 MED ORDER — ONDANSETRON HCL 4 MG/2ML IJ SOLN
INTRAMUSCULAR | Status: AC
  Filled 2019-11-14: qty 2

## 2019-11-14 MED ORDER — PROPOFOL 10 MG/ML IV BOLUS
INTRAVENOUS | Status: AC
  Filled 2019-11-14: qty 20

## 2019-11-14 MED ORDER — PANTOPRAZOLE SODIUM 40 MG IV SOLR
40.0000 mg | Freq: Every day | INTRAVENOUS | Status: DC
  Administered 2019-11-14: 40 mg via INTRAVENOUS
  Filled 2019-11-14: qty 40

## 2019-11-14 MED ORDER — CEFAZOLIN SODIUM-DEXTROSE 2-3 GM-%(50ML) IV SOLR
INTRAVENOUS | Status: DC | PRN
  Administered 2019-11-14: 2 g via INTRAVENOUS

## 2019-11-14 MED ORDER — LIDOCAINE-EPINEPHRINE 0.5 %-1:200000 IJ SOLN
INTRAMUSCULAR | Status: AC
  Filled 2019-11-14: qty 1

## 2019-11-14 MED ORDER — ACETAMINOPHEN 650 MG RE SUPP: 650.0000 mg | RECTAL | Status: DC | PRN

## 2019-11-14 MED ORDER — IOHEXOL 350 MG/ML SOLN
150.0000 mL | Freq: Once | INTRAVENOUS | Status: AC | PRN
  Administered 2019-11-14: 150 mL via INTRAVENOUS

## 2019-11-14 MED ORDER — SODIUM CHLORIDE 0.9 % IV SOLN: INTRAVENOUS | Status: DC

## 2019-11-14 MED ORDER — LIDOCAINE-EPINEPHRINE 0.5 %-1:200000 IJ SOLN
INTRAMUSCULAR | Status: DC | PRN
  Administered 2019-11-14: 10 mL via INTRADERMAL

## 2019-11-14 MED ORDER — STROKE: EARLY STAGES OF RECOVERY BOOK
Freq: Once | Status: AC
  Administered 2019-11-15: 1
  Filled 2019-11-14: qty 1

## 2019-11-14 MED ORDER — MANNITOL 25 % IV SOLN
INTRAVENOUS | Status: DC | PRN
  Administered 2019-11-14: 25 g via INTRAVENOUS

## 2019-11-14 MED ORDER — PHENYLEPHRINE HCL (PRESSORS) 10 MG/ML IV SOLN
INTRAVENOUS | Status: DC | PRN
  Administered 2019-11-14: 100 ug via INTRAVENOUS

## 2019-11-14 MED ORDER — ALBUMIN HUMAN 5 % IV SOLN: INTRAVENOUS | Status: DC | PRN

## 2019-11-14 MED ORDER — ACETAMINOPHEN 10 MG/ML IV SOLN: 1000.0000 mg | Freq: Four times a day (QID) | INTRAVENOUS | Status: DC | PRN

## 2019-11-14 MED ORDER — CHLORHEXIDINE GLUCONATE CLOTH 2 % EX PADS
6.0000 | MEDICATED_PAD | Freq: Every day | CUTANEOUS | Status: DC
  Administered 2019-11-14 – 2019-11-21 (×7): 6 via TOPICAL

## 2019-11-14 MED ORDER — SUCCINYLCHOLINE CHLORIDE 20 MG/ML IJ SOLN
INTRAMUSCULAR | Status: DC | PRN
  Administered 2019-11-14: 100 mg via INTRAVENOUS

## 2019-11-14 MED ORDER — FENTANYL CITRATE (PF) 100 MCG/2ML IJ SOLN: 25.0000 ug | INTRAMUSCULAR | Status: DC | PRN

## 2019-11-14 MED ORDER — 0.9 % SODIUM CHLORIDE (POUR BTL) OPTIME
TOPICAL | Status: DC | PRN
  Administered 2019-11-14 (×2): 1000 mL

## 2019-11-14 MED ORDER — THROMBIN 5000 UNITS EX SOLR
OROMUCOSAL | Status: DC | PRN
  Administered 2019-11-14: 5 mL via TOPICAL

## 2019-11-14 MED ORDER — POTASSIUM CHLORIDE 10 MEQ/100ML IV SOLN: 10.0000 meq | INTRAVENOUS | Status: AC

## 2019-11-14 MED ORDER — THROMBIN 20000 UNITS EX SOLR
CUTANEOUS | Status: DC | PRN
  Administered 2019-11-14: 20 mL via TOPICAL

## 2019-11-14 MED ORDER — POTASSIUM CHLORIDE IN NACL 20-0.9 MEQ/L-% IV SOLN: INTRAVENOUS | Status: DC

## 2019-11-14 MED ORDER — ASPIRIN 325 MG PO TABS: 325.0000 mg | ORAL_TABLET | Freq: Every day | ORAL | Status: DC

## 2019-11-14 MED ORDER — PHENYLEPHRINE HCL-NACL 10-0.9 MG/250ML-% IV SOLN
INTRAVENOUS | Status: DC | PRN
  Administered 2019-11-14: 25 ug/min via INTRAVENOUS

## 2019-11-14 MED ORDER — DEXAMETHASONE SODIUM PHOSPHATE 10 MG/ML IJ SOLN
INTRAMUSCULAR | Status: DC | PRN
  Administered 2019-11-14: 10 mg via INTRAVENOUS

## 2019-11-14 MED ORDER — VENLAFAXINE HCL 75 MG PO TABS
75.0000 mg | ORAL_TABLET | Freq: Two times a day (BID) | ORAL | Status: DC
  Filled 2019-11-14 (×3): qty 1

## 2019-11-14 MED ORDER — HEPARIN SODIUM (PORCINE) 5000 UNIT/ML IJ SOLN
5000.0000 [IU] | Freq: Three times a day (TID) | INTRAMUSCULAR | Status: DC
  Administered 2019-11-15 – 2019-11-25 (×29): 5000 [IU] via SUBCUTANEOUS
  Filled 2019-11-14 (×28): qty 1

## 2019-11-14 MED ORDER — PHENYLEPHRINE HCL (PRESSORS) 10 MG/ML IV SOLN
INTRAVENOUS | Status: DC | PRN
  Administered 2019-11-14 (×2): 100 ug via INTRAVENOUS

## 2019-11-14 MED ORDER — FENTANYL CITRATE (PF) 250 MCG/5ML IJ SOLN
INTRAMUSCULAR | Status: AC
  Filled 2019-11-14: qty 5

## 2019-11-14 MED ORDER — FENTANYL CITRATE (PF) 100 MCG/2ML IJ SOLN: INTRAMUSCULAR | Status: DC | PRN

## 2019-11-14 MED ORDER — FENTANYL CITRATE (PF) 100 MCG/2ML IJ SOLN
INTRAMUSCULAR | Status: DC | PRN
  Administered 2019-11-14: 150 ug via INTRAVENOUS
  Administered 2019-11-14: 100 ug via INTRAVENOUS

## 2019-11-14 MED ORDER — POTASSIUM CHLORIDE 10 MEQ/50ML IV SOLN: 10.0000 meq | INTRAVENOUS | Status: DC

## 2019-11-14 MED ORDER — ACETAMINOPHEN 160 MG/5ML PO SOLN
650.0000 mg | ORAL | Status: DC | PRN
  Administered 2019-11-15 – 2019-11-24 (×12): 650 mg
  Filled 2019-11-14 (×13): qty 20.3

## 2019-11-14 MED ORDER — LEVETIRACETAM IN NACL 500 MG/100ML IV SOLN
500.0000 mg | Freq: Two times a day (BID) | INTRAVENOUS | Status: DC
  Administered 2019-11-14 – 2019-11-16 (×5): 500 mg via INTRAVENOUS
  Filled 2019-11-14 (×6): qty 100

## 2019-11-14 MED ORDER — PROMETHAZINE HCL 25 MG PO TABS: 12.5000 mg | ORAL_TABLET | ORAL | Status: DC | PRN

## 2019-11-14 MED ORDER — LABETALOL HCL 5 MG/ML IV SOLN: 10.0000 mg | INTRAVENOUS | Status: DC | PRN

## 2019-11-14 MED ORDER — MORPHINE SULFATE (PF) 2 MG/ML IV SOLN: 1.0000 mg | INTRAVENOUS | Status: DC | PRN

## 2019-11-14 MED ORDER — SODIUM CHLORIDE 3 % IV SOLN
75.0000 mL/h | INTRAVENOUS | Status: DC
  Administered 2019-11-14: 75 mL/h via INTRAVENOUS
  Filled 2019-11-14 (×5): qty 500

## 2019-11-14 MED ORDER — MICROFIBRILLAR COLL HEMOSTAT EX PADS
MEDICATED_PAD | CUTANEOUS | Status: DC | PRN
  Administered 2019-11-14: 1 via TOPICAL

## 2019-11-14 MED ORDER — ACETAMINOPHEN 325 MG PO TABS: 650.0000 mg | ORAL_TABLET | ORAL | Status: DC | PRN

## 2019-11-14 MED ORDER — ONDANSETRON HCL 4 MG/2ML IJ SOLN: 4.0000 mg | Freq: Four times a day (QID) | INTRAMUSCULAR | Status: DC | PRN

## 2019-11-14 MED ORDER — FAMOTIDINE IN NACL 20-0.9 MG/50ML-% IV SOLN
20.0000 mg | Freq: Two times a day (BID) | INTRAVENOUS | Status: DC
  Administered 2019-11-14: 20 mg via INTRAVENOUS
  Filled 2019-11-14: qty 50

## 2019-11-14 SURGICAL SUPPLY — 66 items
APL SKNCLS STERI-STRIP NONHPOA (GAUZE/BANDAGES/DRESSINGS)
BENZOIN TINCTURE PRP APPL 2/3 (GAUZE/BANDAGES/DRESSINGS) IMPLANT
BLADE CLIPPER SURG (BLADE) ×3 IMPLANT
BLADE ULTRA TIP 2M (BLADE) ×3 IMPLANT
BNDG CMPR 75X41 PLY ABS (GAUZE/BANDAGES/DRESSINGS) ×1
BNDG GAUZE ELAST 4 BULKY (GAUZE/BANDAGES/DRESSINGS) ×6 IMPLANT
BNDG STRETCH 4X75 NS LF (GAUZE/BANDAGES/DRESSINGS) ×3 IMPLANT
BUR ACORN 6.0 PRECISION (BURR) ×2 IMPLANT
BUR ACORN 6.0MM PRECISION (BURR) ×1
BUR MATCHSTICK NEURO 3.0 LAGG (BURR) IMPLANT
BUR SPIRAL ROUTER 2.3 (BUR) IMPLANT
BUR SPIRAL ROUTER 2.3MM (BUR)
CANISTER SUCT 3000ML PPV (MISCELLANEOUS) ×3 IMPLANT
CARTRIDGE OIL MAESTRO DRILL (MISCELLANEOUS) ×1 IMPLANT
CLIP RANEY DISP (INSTRUMENTS) ×3 IMPLANT
CLIP VESOCCLUDE MED 6/CT (CLIP) IMPLANT
COVER WAND RF STERILE (DRAPES) ×3 IMPLANT
DIFFUSER DRILL AIR PNEUMATIC (MISCELLANEOUS) ×3 IMPLANT
DRAPE NEUROLOGICAL W/INCISE (DRAPES) ×3 IMPLANT
DRAPE SURG 17X23 STRL (DRAPES) IMPLANT
DRAPE WARM FLUID 44X44 (DRAPES) ×3 IMPLANT
DRSG OPSITE POSTOP 4X8 (GAUZE/BANDAGES/DRESSINGS) ×3 IMPLANT
DURAPREP 6ML APPLICATOR 50/CS (WOUND CARE) ×3 IMPLANT
ELECT REM PT RETURN 9FT ADLT (ELECTROSURGICAL) ×3
ELECTRODE REM PT RTRN 9FT ADLT (ELECTROSURGICAL) ×1 IMPLANT
EVACUATOR 1/8 PVC DRAIN (DRAIN) IMPLANT
EVACUATOR SILICONE 100CC (DRAIN) IMPLANT
GAUZE 4X4 16PLY RFD (DISPOSABLE) IMPLANT
GAUZE SPONGE 4X4 12PLY STRL (GAUZE/BANDAGES/DRESSINGS) ×3 IMPLANT
GLOVE ECLIPSE 6.5 STRL STRAW (GLOVE) ×3 IMPLANT
GLOVE EXAM NITRILE XL STR (GLOVE) IMPLANT
GOWN STRL REUS W/ TWL LRG LVL3 (GOWN DISPOSABLE) ×2 IMPLANT
GOWN STRL REUS W/ TWL XL LVL3 (GOWN DISPOSABLE) IMPLANT
GOWN STRL REUS W/TWL 2XL LVL3 (GOWN DISPOSABLE) IMPLANT
GOWN STRL REUS W/TWL LRG LVL3 (GOWN DISPOSABLE) ×6
GOWN STRL REUS W/TWL XL LVL3 (GOWN DISPOSABLE)
GRAFT DURAGEN MATRIX 5WX7L (Graft) ×3 IMPLANT
HEMOSTAT SURGICEL 2X14 (HEMOSTASIS) IMPLANT
HOOK DURA 1/2IN (MISCELLANEOUS) ×3 IMPLANT
KIT BASIN OR (CUSTOM PROCEDURE TRAY) ×3 IMPLANT
KIT TURNOVER KIT B (KITS) ×3 IMPLANT
NEEDLE HYPO 25X1 1.5 SAFETY (NEEDLE) ×3 IMPLANT
NS IRRIG 1000ML POUR BTL (IV SOLUTION) ×9 IMPLANT
OIL CARTRIDGE MAESTRO DRILL (MISCELLANEOUS) ×3
PACK CRANIOTOMY CUSTOM (CUSTOM PROCEDURE TRAY) ×3 IMPLANT
PATTIES SURGICAL .5 X.5 (GAUZE/BANDAGES/DRESSINGS) IMPLANT
PATTIES SURGICAL .5 X3 (DISPOSABLE) IMPLANT
PATTIES SURGICAL 1X1 (DISPOSABLE) IMPLANT
SPONGE NEURO XRAY DETECT 1X3 (DISPOSABLE) IMPLANT
SPONGE SURGIFOAM ABS GEL 100 (HEMOSTASIS) ×3 IMPLANT
STAPLER VISISTAT 35W (STAPLE) ×3 IMPLANT
SUT ETHILON 3 0 FSL (SUTURE) IMPLANT
SUT ETHILON 3 0 PS 1 (SUTURE) IMPLANT
SUT NURALON 4 0 TR CR/8 (SUTURE) ×9 IMPLANT
SUT STEEL 0 (SUTURE)
SUT STEEL 0 18XMFL TIE 17 (SUTURE) IMPLANT
SUT VIC AB 2-0 CT2 18 VCP726D (SUTURE) ×6 IMPLANT
TAPE SURG TRANSPORE 1 IN (GAUZE/BANDAGES/DRESSINGS) ×1 IMPLANT
TAPE SURGICAL TRANSPORE 1 IN (GAUZE/BANDAGES/DRESSINGS) ×2
TOWEL GREEN STERILE (TOWEL DISPOSABLE) ×3 IMPLANT
TOWEL GREEN STERILE FF (TOWEL DISPOSABLE) ×3 IMPLANT
TRAY FOLEY MTR SLVR 16FR STAT (SET/KITS/TRAYS/PACK) ×3 IMPLANT
TUBE CONNECTING 12'X1/4 (SUCTIONS) ×1
TUBE CONNECTING 12X1/4 (SUCTIONS) ×2 IMPLANT
UNDERPAD 30X30 (UNDERPADS AND DIAPERS) ×3 IMPLANT
WATER STERILE IRR 1000ML POUR (IV SOLUTION) ×3 IMPLANT

## 2019-11-14 NOTE — ED Notes (Signed)
Per Lab, CBC and diff clotted. New order placed and will resend labs.

## 2019-11-14 NOTE — Progress Notes (Signed)
RT made RN aware of ABG results.

## 2019-11-14 NOTE — Progress Notes (Signed)
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  On imaging she already has a large right MCA territory infarct.  She has been seen by neurosurgery and we are anticipating a right decompressive craniotomy.  We are consulted for perioperative care.  History of present illness        Unfortunately we have very little history on this 46 year old.  A friend tried to call her this morning as she usually does at 6 AM and when she was unable to awaken the patient went to visit and found her unresponsive.  She was brought to our department of emergency medicine where a CT and CTA were performed.  She has a large right MCA infarct and an M1 occlusion.  She is not a candidate for either thrombolytics or thrombectomy and she has been seen by neurosurgery who is anticipating taking her to the operating room this evening.  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 Past surgical history is remarkable for a laparoscopic cholecystectomy.  Significant Hospital Events   Admission 11/24/19  Consults:    Procedures:    Significant Diagnostic Tests:  CT and CTA showing a large right MCA infarct and M1 occlusion  Micro Data:  None  Antimicrobials:  None   Interim history/subjective:    Objective   Blood pressure (!) 167/99, pulse (!) 101, temperature 99.2 F (37.3 C), temperature source Oral, resp. rate (!) 21, SpO2 98 %.       No intake or output data in the 24 hours ending Nov 24, 2019 1721 There were no vitals filed for this visit.  Examination: General: The patient appears to be her stated age.  She is moaning spontaneously, she does attempt  speech but it is largely garbled and unintelligible. HENT: No external evidence of trauma Lungs: Respirations are unlabored there is symmetric air movement no wheezes. Cardiovascular: She has no JVD, there is a regular rate and rhythm no murmur rub or gallop.  She has no dependent edema Abdomen: The abdomen is soft loading organomegaly masses tenderness guarding or rebound Extremities: The extremities are warm. Neuro: She does attempt speech in response to questions but this is largely unintelligible.  Both eyes are deviated to the right the pupils are equal.  The face appears to be symmetric.  She has normal strength in the right extremities, she does withdraw both the left upper extremity and left lower extremity from pain although strength appears to be substantially decreased.   Resolved Hospital Problem list     Assessment & Plan:  This is a 64 year old who was suffered from a large right MCA territory infarct.  She is patient being taken to the operating room for decompressive craniotomy in anticipation of resolving catastrophic MCA syndrome.  The usual echo and lipid profiles will be obtained looking for provocation as well as monitoring of her rhythm.  Aspirin is currently on hold anticipating a trip to the operating room we will initiate a statin as soon as enteral access is established.  She is currently not having any difficulties in maintaining her airway and I do not feel as though we need to intubate this patient prior to proceeding to the operating  room. She has a history of dyspnea on exertion and a CT scan of her chest suggest concurrent emphysema, bronchodilators will be added if indicated. In addition, she is intermittently agitated during my exam.  Her LFTs are normal her MCV is normal she has no other stigmata of alcohol use however the intermittent agitation persists I anticipate the addition of Precedex as a sedative agent along with thiamine and vitamin replacement. Finally  she is hypokalemic, I do not have a good explanation I am replacing her potassium as indicated.  Is a required field with Best practice:  Diet: N.p.o. pending surgery Pain/Anxiety/Delirium protocol (if indicated): Not indicated VAP protocol (if indicated): In place DVT prophylaxis: SCDs only due to impending neurosurgery GI prophylaxis: Pepcid Glucose control: None required Mobility: Bed rest Code Status: Full Family Communication: Family is aware of the plan of care Disposition:   Labs   CBC: Recent Labs  Lab 03-Dec-2019 1059 12/03/19 1200  WBC  --  11.1*  NEUTROABS  --  9.5*  HGB 13.9 13.0  HCT 41.0 38.0  MCV  --  89.4  PLT  --  433    Basic Metabolic Panel: Recent Labs  Lab 03-Dec-2019 1030 2019/12/03 1059  NA 140 140  K 3.2* 3.0*  CL 102 104  CO2 23  --   GLUCOSE 127* 112*  BUN 12 12  CREATININE 0.77 0.50  CALCIUM 9.5  --    GFR: CrCl cannot be calculated (Unknown ideal weight.). Recent Labs  Lab 12-03-2019 1200  WBC 11.1*    Liver Function Tests: Recent Labs  Lab 12/03/2019 1030  AST 29  ALT 18  ALKPHOS 101  BILITOT 1.0  PROT 8.1  ALBUMIN 4.2   No results for input(s): LIPASE, AMYLASE in the last 168 hours. No results for input(s): AMMONIA in the last 168 hours.  ABG    Component Value Date/Time   TCO2 26 2019/12/03 1059     Coagulation Profile: Recent Labs  Lab 12/03/2019 1030  INR 1.0    Cardiac Enzymes: No results for input(s): CKTOTAL, CKMB, CKMBINDEX, TROPONINI in the last 168 hours.  HbA1C: No results found for: HGBA1C  CBG: Recent Labs  Lab 12/03/19 1026  GLUCAP 124*    Review of Systems:   Not obtainable  Past Medical History  She,  has a past medical history of Anemia, Depression, Gallstones, Hyperlipidemia, Nocturia, PONV (postoperative nausea and vomiting), Shortness of breath, Skew deviation of eye, left, and Stroke ([place]).   Surgical History    Past Surgical History:  Procedure Laterality Date  . CHOLECYSTECTOMY   03/16/2012   Procedure: LAPAROSCOPIC CHOLECYSTECTOMY WITH INTRAOPERATIVE CHOLANGIOGRAM;  Surgeon: Madilyn Hook, DO;  Location: [facility];  Service: General;  Laterality: N/A;  Laparoscopic Cholecystectomy,intraoperative Cholangiogram  . ERCP  03/16/2012   Procedure: ENDOSCOPIC RETROGRADE CHOLANGIOPANCREATOGRAPHY (ERCP);  Surgeon: Beryle Beams, MD;  Location: [facility];  Service: Endoscopy;  Laterality: N/A;  . TUBAL LIGATION       Social History   reports that she quit smoking about 13 years ago. She does not have any smokeless tobacco history on file. She reports that she does not drink alcohol or use drugs.   Family History   Her family history includes COPD in her father; Cancer in her maternal grandfather.   Allergies Allergies  Allergen Reactions  . Erythromycin Swelling and Rash     Home Medications  Prior to Admission medications   Medication Sig Start Date End Date Taking? Authorizing Provider  aspirin  EC 325 MG tablet Take 325 mg by mouth daily.    [provider]  famotidine (PEPCID) 20 MG tablet Take 20 mg by mouth 2 (two) times daily.    [provider]  fenofibrate micronized (LOFIBRA) 200 MG capsule Take 200 mg by mouth daily.    [provider]  fish oil-omega-3 fatty acids 1000 MG capsule Take 1 g by mouth 2 (two) times daily.    [provider]  methylcellulose (ARTIFICIAL TEARS) 1 % ophthalmic solution Place 1 drop into both eyes as needed. Dry eyes    [provider]  niacin (SLO-NIACIN) 500 MG tablet Take 500 mg by mouth 2 (two) times daily with a meal.    [provider]  pravastatin (PRAVACHOL) 40 MG tablet Take 40 mg by mouth daily.    [provider]  venlafaxine (EFFEXOR-XR) 150 MG 24 hr capsule Take 150 mg by mouth daily.    [provider]  vitamin B-12 (CYANOCOBALAMIN) 500 MCG tablet Take 500 mcg by mouth 2 (two) times daily.    [provider]  zolpidem (AMBIEN) 10 MG tablet  Take 10 mg by mouth at bedtime as needed. For sleep.    [provider]     Critical care time: Greater than 32 minutes

## 2019-11-14 NOTE — Progress Notes (Signed)
  Echocardiogram 2D Echocardiogram has been performed.  Paula Kim Petrow 11/10/2019, 4:17 PM

## 2019-11-14 NOTE — OR Nursing (Signed)
Skull flap placed in abdominal wall. Incision 1958. Closed with correct count at 2006.

## 2019-11-14 NOTE — Anesthesia Preprocedure Evaluation (Addendum)
Anesthesia Evaluation  Patient identified by MRN, date of birth, ID band Patient awake    Reviewed: Allergy & Precautions, H&P , NPO status , Patient's Chart, lab work & pertinent test results  History of Anesthesia Complications (+) PONV and history of anesthetic complications  Airway Mallampati: II   Neck ROM: full    Dental   Pulmonary former smoker,    breath sounds clear to auscultation       Cardiovascular negative cardio ROS   Rhythm:regular Rate:Normal     Neuro/Psych PSYCHIATRIC DISORDERS Depression Acute right MCA infarct. CVA    GI/Hepatic   Endo/Other    Renal/GU      Musculoskeletal   Abdominal   Peds  Hematology   Anesthesia Other Findings   Reproductive/Obstetrics                            Anesthesia Physical Anesthesia Plan  ASA: III and emergent  Anesthesia Plan: General   Post-op Pain Management:    Induction: Intravenous  PONV Risk Score and Plan: 4 or greater and Ondansetron, Dexamethasone and Treatment may vary due to age or medical condition  Airway Management Planned: Oral ETT  Additional Equipment:   Intra-op Plan:   Post-operative Plan: Extubation in OR  Informed Consent: I have reviewed the patients History and Physical, chart, labs and discussed the procedure including the risks, benefits and alternatives for the proposed anesthesia with the patient or authorized representative who has indicated his/her understanding and acceptance.       Plan Discussed with: CRNA, Anesthesiologist and Surgeon  Anesthesia Plan Comments:         Anesthesia Quick Evaluation

## 2019-11-14 NOTE — ED Notes (Signed)
MRI reports that the pt is agitated in MRI and they are unable to do the scan due to this. Kirkpatrick MD notified and says to hold MRI at this time. MRI notified.

## 2019-11-14 NOTE — Anesthesia Procedure Notes (Signed)
Arterial Line Insertion Start/End02/25/2021 6:00 PM, 11/11/2019 6:06 PM Performed by: Epifanio Lesches, CRNA, CRNA  Preanesthetic checklist: patient identified, IV checked, site marked, risks and benefits discussed, surgical consent, monitors and equipment checked, pre-op evaluation, timeout performed and anesthesia consent Lidocaine 1% used for infiltration Left, radial was placed Catheter size: 20 G Hand hygiene performed  and maximum sterile barriers used  Allen's test indicative of satisfactory collateral circulation Attempts: 1 Procedure performed without using ultrasound guided technique. Ultrasound Notes:anatomy identified Following insertion, dressing applied and Biopatch. Post procedure assessment: normal  Patient tolerated the procedure well with no immediate complications.

## 2019-11-14 NOTE — Progress Notes (Signed)
eLink Physician-Brief Progress Note Patient Name: Paula Kim DOB: 09/22/1956 MRN: [id number]   Date of Service  11/09/2019  HPI/Events of Note  Pt admitted with right MCA territory large CVA with cerebral edema and mass effect s/p decompressive hemicraniectomy. Pt is on the ventilator post-op.  eICU Interventions  New Patient Evaluation completed, hypertonic saline for increased ICP, Precedex for sedation, iv Tylenol for headache/pain, normotension is BP goal-Cardene for uncontrolled hypertension.        Thomasene Lot Curtiss Mahmood 10/30/2019, 9:03 PM

## 2019-11-14 NOTE — Anesthesia Procedure Notes (Signed)
Procedure Name: Intubation Date/Time: 10/31/2019 6:26 PM Performed by: Eligha Bridegroom, CRNA Pre-anesthesia Checklist: Patient identified, Emergency Drugs available, Suction available and Patient being monitored Patient Re-evaluated:Patient Re-evaluated prior to induction Oxygen Delivery Method: Circle system utilized Preoxygenation: Pre-oxygenation with 100% oxygen Induction Type: IV induction, Rapid sequence and Cricoid Pressure applied Laryngoscope Size: Mac and 4 Grade View: Grade I Tube type: Oral Tube size: 7.0 mm Number of attempts: 1 Airway Equipment and Method: Stylet Placement Confirmation: ETT inserted through vocal cords under direct vision,  positive ETCO2 and breath sounds checked- equal and bilateral Secured at: 21 cm Tube secured with: Tape Dental Injury: Teeth and Oropharynx as per pre-operative assessment

## 2019-11-14 NOTE — Anesthesia Postprocedure Evaluation (Signed)
Anesthesia Post Note  Patient: Paula Kim  Procedure(s) Performed: Right HEMICRANIECTOMY for cerebral decompression (Right Head)     Patient location during evaluation: SICU Anesthesia Type: General Level of consciousness: sedated Pain management: pain level controlled Vital Signs Assessment: post-procedure vital signs reviewed and stable Respiratory status: patient remains intubated per anesthesia plan Cardiovascular status: stable Postop Assessment: no apparent nausea or vomiting Anesthetic complications: no    Last Vitals:  Vitals:   11/20/2019 2034 11/06/2019 2100  BP:  101/69  Pulse:    Resp:  16  Temp:    SpO2: 98%     Last Pain:  Vitals:   11/16/2019 1031  TempSrc:   PainSc: 0-No pain                 Jadan Rouillard S

## 2019-11-14 NOTE — Progress Notes (Signed)
eLink Physician-Brief Progress Note Patient Name: Paula Kim DOB: [birth year] MRN: [id number]   Date of Service  11/19/2019  HPI/Events of Note  K+ 2.7  eICU Interventions  PICC line ordered, KCL 10 meq iv Q 1 hour x 6 via PICC line.        Thomasene Lot Kwaku Mostafa 11/18/2019, 10:21 PM

## 2019-11-14 NOTE — H&P (Signed)
Neurology H&P  CC: Left-sided weakness  History is obtained from: Chart review  HPI: Paula Kim is a 64 y.o. female with a history of hyperlipidemia who presents with left-sided weakness that was present on discovery this morning.  She typically calls one of her friends every morning at 6 AM, when the friend was unable to get a hold of her this morning someone to check on her and found her on the ground with left-sided weakness.  She was brought into the emergency department where a stat CT/A/P were done showing a large right MCA infarct.  Though her last known well is not exactly known, apparently she had some activity on Facebook around 6 PM the night prior to admission.  She is very dysarthric and difficult to understand.  She states that she does not know how long she was on the floor for.  LKW: 6 AM on 2/17 tpa given?: No, outside of window IR Thrombectomy? No, established infarct Modified Rankin Scale: 0-Completely asymptomatic and back to baseline post- stroke   ROS:  Unable to obtain due to altered mental status.   Past Medical History:  Diagnosis Date  . Anemia    AS TEENAGER  . Depression   . Gallstones   . Hyperlipidemia   . Nocturia   . PONV (postoperative nausea and vomiting)   . Shortness of breath    WITH EXERTION  . 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 Stroke [facility])    2009 - NO RESIDUAL PROBLEMS     Family History  Problem Relation Age of Onset  . COPD Father   . Cancer Maternal Grandfather        colon     Social History:  reports that she quit smoking about 13 years ago. She does not have any smokeless tobacco history on file. She reports that she does not drink alcohol or use drugs.   Prior to Admission medications   Medication Sig Start Date End Date Taking? Authorizing Provider  aspirin EC 325 MG tablet Take 325 mg by mouth daily.    [provider]  famotidine (PEPCID) 20 MG tablet  Take 20 mg by mouth 2 (two) times daily.    [provider]  fenofibrate micronized (LOFIBRA) 200 MG capsule Take 200 mg by mouth daily.    [provider]  fish oil-omega-3 fatty acids 1000 MG capsule Take 1 g by mouth 2 (two) times daily.    [provider]  methylcellulose (ARTIFICIAL TEARS) 1 % ophthalmic solution Place 1 drop into both eyes as needed. Dry eyes    [provider]  niacin (SLO-NIACIN) 500 MG tablet Take 500 mg by mouth 2 (two) times daily with a meal.    [provider]  pravastatin (PRAVACHOL) 40 MG tablet Take 40 mg by mouth daily.    [provider]  venlafaxine (EFFEXOR-XR) 150 MG 24 hr capsule Take 150 mg by mouth daily.    [provider]  vitamin B-12 (CYANOCOBALAMIN) 500 MCG tablet Take 500 mcg by mouth 2 (two) times daily.    [provider]  zolpidem (AMBIEN) 10 MG tablet Take 10 mg by mouth at bedtime as needed. For sleep.    [provider]     Exam: Current vital signs: BP (!) 144/96   Pulse 99   Temp 99.2 F (37.3 C) (Oral)   Resp (!) 21   SpO2 100%  Physical Exam  Constitutional: Appears well-developed and well-nourished.  Psych: Affect appropriate to situation Eyes: No scleral injection HENT: No OP obstrucion Head: Normocephalic.  Cardiovascular: Normal rate and regular rhythm.  Respiratory: Effort normal and breath sounds normal to anterior ascultation GI: Soft.  No distension. There is no tenderness.  Skin: WDI  Neuro: Mental Status: She is awake, very dysarthric, but does answer simple questions and follow simple commands.  She has severe left hemineglect. Cranial Nerves: II: Right hemianopia.  Pupils are equal, round, and reactive to light.   III,IV, VI: EOMI without ptosis or diploplia.  V: Facial sensation is diminished on the left VII: Facial movement with left facial weakness Motor: She has antigravity strength in her left lower extremity, left upper  extremity has mildly increased tone with no voluntary movement. Sensory: Sensation is diminished on the left Cerebellar: No clear ataxia on the right   I have reviewed labs in epic and the pertinent results are: CMP-unremarkable  I have reviewed the images obtained: CT head-large left MCA infarct.  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   Secondary Diagnosis:   Impression: 64 year old female with a history of previous stroke now with large right MCA stroke.  She is at risk of developing malignant cerebral edema and will need to be monitored closely in the intensive care unit.  I will go ahead and start her on 3% saline.  Currently she is protecting her airway, but I did discuss what her likely outcome would be (nursing home level care, but possible that she could improve to the point to have a conversation) with her niece.  At this time, she does wish full aggressive care.  Plan: - HgbA1c, fasting lipid panel - MRI  of the brain without contrast - Frequent neuro checks - Echocardiogram - Carotid dopplers - Prophylactic therapy-Antiplatelet med: Aspirin - dose 325mg  PO or 300mg  PR - Risk factor modification - Telemetry monitoring - PT consult, OT consult, Speech consult - Stroke team to follow  -Continue home venlafaxine   This patient is critically ill and at significant risk of neurological worsening, death and care requires constant monitoring of vital signs, hemodynamics,respiratory and cardiac monitoring, neurological assessment, discussion with family, other specialists and medical decision making of high complexity. I spent 45 minutes of neurocritical care time  in the care of  this patient. This was time spent independent of any time provided by nurse practitioner or PA.  , MD [facility] [phone]  If 7pm- 7am, please page neurology on call as listed in AMION.

## 2019-11-14 NOTE — Consult Note (Signed)
Reason for Consult:right MCA infarct Referring Physician: Aizlyn Schifano is an 64 y.o. female.  HPI: whom sustained a right MCA infarct at some time before admission to the hospital Head CT shows large right MCA territory infarct  Past Medical History:  Diagnosis Date  . Anemia    AS TEENAGER  . Depression   . Gallstones   . Hyperlipidemia   . Nocturia   . PONV (postoperative nausea and vomiting)   . Shortness of breath    WITH EXERTION  . 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 Stroke [facility])    2009 - NO RESIDUAL PROBLEMS    Past Surgical History:  Procedure Laterality Date  . CHOLECYSTECTOMY  03/16/2012   Procedure: LAPAROSCOPIC CHOLECYSTECTOMY WITH INTRAOPERATIVE CHOLANGIOGRAM;  Surgeon: Lodema Pilot, DO;  Location: [facility];  Service: General;  Laterality: N/A;  Laparoscopic Cholecystectomy,intraoperative Cholangiogram  . ERCP  03/16/2012   Procedure: ENDOSCOPIC RETROGRADE CHOLANGIOPANCREATOGRAPHY (ERCP);  Surgeon: Theda Belfast, MD;  Location: [facility];  Service: Endoscopy;  Laterality: N/A;  . TUBAL LIGATION      Family History  Problem Relation Age of Onset  . COPD Father   . Cancer Maternal Grandfather        colon    Social History:  reports that she quit smoking about 13 years ago. She does not have any smokeless tobacco history on file. She reports that she does not drink alcohol or use drugs.  Allergies:  Allergies  Allergen Reactions  . Erythromycin Swelling and Rash    Medications: I have reviewed the patient's current medications.  Results for orders placed or performed during the hospital encounter of Dec 06, 2019 (from the past 48 hour(s))  CBG monitoring, ED     Status: Abnormal   Collection Time: 2019-12-06 10:26 AM  Result Value Ref Range   Glucose-Capillary 124 (H) 70 - 99 mg/dL  Ethanol     Status: None   Collection Time: 2019-12-06 10:30 AM  Result Value Ref Range   Alcohol, Ethyl (B)  <10 <10 mg/dL    Comment: (NOTE) Lowest detectable limit for serum alcohol is 10 mg/dL. For medical purposes only. Performed at [facility], [address] [place], [place] [address]   Protime-INR     Status: None   Collection Time: 12-06-19 10:30 AM  Result Value Ref Range   Prothrombin Time 12.6 11.4 - 15.2 seconds   INR 1.0 0.8 - 1.2    Comment: (NOTE) INR goal varies based on device and disease states. Performed at [facility], [address], [place], [place] [address]   APTT     Status: Abnormal   Collection Time: December 06, 2019 10:30 AM  Result Value Ref Range   aPTT 20 (L) 24 - 36 seconds    Comment: REPEATED TO VERIFY SPECIMEN CHECKED FOR CLOTS Performed at [facility], [address] [place], [place] [address]   Comprehensive metabolic panel     Status: Abnormal   Collection Time: Dec 06, 2019 10:30 AM  Result Value Ref Range   Sodium 140 135 - 145 mmol/L   Potassium 3.2 (L) 3.5 - 5.1 mmol/L   Chloride 102 98 - 111 mmol/L   CO2 23 22 - 32 mmol/L   Glucose, Bld 127 (H) 70 - 99 mg/dL   BUN 12 8 - 23 mg/dL   Creatinine, Ser 8.84 0.44 - 1.00 mg/dL   Calcium 9.5 8.9 -  10.3 mg/dL   Total Protein 8.1 6.5 - 8.1 g/dL   Albumin 4.2 3.5 - 5.0 g/dL   AST 29 15 - 41 U/L   ALT 18 0 - 44 U/L   Alkaline Phosphatase 101 38 - 126 U/L   Total Bilirubin 1.0 0.3 - 1.2 mg/dL   GFR calc non Af Amer >60 >60 mL/min   GFR calc Af Amer >60 >60 mL/min   Anion gap 15 5 - 15    Comment: Performed at [facility], [place] [address], [place], [place] [address]  I-stat chem 8, ED     Status: Abnormal   Collection Time: 11/08/2019 10:59 AM  Result Value Ref Range   Sodium 140 135 - 145 mmol/L   Potassium 3.0 (L) 3.5 - 5.1 mmol/L   Chloride 104 98 - 111 mmol/L   BUN 12 8 - 23 mg/dL   Creatinine, Ser 0.50 0.44 - 1.00 mg/dL   Glucose, Bld 112 (H) 70 - 99 mg/dL   Calcium, Ion 1.09 (L) 1.15 - 1.40 mmol/L   TCO2 26 22 - 32 mmol/L   Hemoglobin 13.9 12.0 - 15.0 g/dL   HCT 41.0  36.0 - 46.0 %  CBC with Differential     Status: Abnormal   Collection Time: 11/09/2019 12:00 PM  Result Value Ref Range   WBC 11.1 (H) 4.0 - 10.5 K/uL   RBC 4.25 3.87 - 5.11 MIL/uL   Hemoglobin 13.0 12.0 - 15.0 g/dL   HCT 38.0 36.0 - 46.0 %   MCV 89.4 80.0 - 100.0 fL   MCH 30.6 26.0 - 34.0 pg   MCHC 34.2 30.0 - 36.0 g/dL   RDW 11.9 11.5 - 15.5 %   Platelets 259 150 - 400 K/uL   nRBC 0.0 0.0 - 0.2 %   Neutrophils Relative % 86 %   Neutro Abs 9.5 (H) 1.7 - 7.7 K/uL   Lymphocytes Relative 9 %   Lymphs Abs 1.0 0.7 - 4.0 K/uL   Monocytes Relative 4 %   Monocytes Absolute 0.5 0.1 - 1.0 K/uL   Eosinophils Relative 0 %   Eosinophils Absolute 0.0 0.0 - 0.5 K/uL   Basophils Relative 0 %   Basophils Absolute 0.0 0.0 - 0.1 K/uL   Immature Granulocytes 1 %   Abs Immature Granulocytes 0.05 0.00 - 0.07 K/uL    Comment: Performed at [facility], [place] [address], [place], [place] [address]  Urine rapid drug screen (hosp performed)     Status: Abnormal   Collection Time: 10/31/2019  1:15 PM  Result Value Ref Range   Opiates NONE DETECTED NONE DETECTED   Cocaine NONE DETECTED NONE DETECTED   Benzodiazepines NONE DETECTED NONE DETECTED   Amphetamines NONE DETECTED NONE DETECTED   Tetrahydrocannabinol POSITIVE (A) NONE DETECTED   Barbiturates NONE DETECTED NONE DETECTED    Comment: (NOTE) DRUG SCREEN FOR MEDICAL PURPOSES ONLY.  IF CONFIRMATION IS NEEDED FOR ANY PURPOSE, NOTIFY LAB WITHIN 5 DAYS. LOWEST DETECTABLE LIMITS FOR URINE DRUG SCREEN Drug Class                     Cutoff (ng/mL) Amphetamine and metabolites    1000 Barbiturate and metabolites    200 Benzodiazepine                 932 Tricyclics and metabolites     300 Opiates and metabolites        300 Cocaine and metabolites  300 THC                            50 Performed at [facility], [address], [place], [place] [address]   Urinalysis, Routine w reflex microscopic     Status: Abnormal   Collection  Time: 11/23/2019  1:15 PM  Result Value Ref Range   Color, Urine YELLOW YELLOW   APPearance CLEAR CLEAR   Specific Gravity, Urine >1.046 (H) 1.005 - 1.030   pH 7.0 5.0 - 8.0   Glucose, UA NEGATIVE NEGATIVE mg/dL   Hgb urine dipstick NEGATIVE NEGATIVE   Bilirubin Urine NEGATIVE NEGATIVE   Ketones, ur 5 (A) NEGATIVE mg/dL   Protein, ur NEGATIVE NEGATIVE mg/dL   Nitrite NEGATIVE NEGATIVE   Leukocytes,Ua NEGATIVE NEGATIVE    Comment: Performed at [facility], [address], [place], [place] [address]  Respiratory Panel by RT PCR (Flu A&B, Covid) - Nasopharyngeal Swab     Status: None   Collection Time: 10/29/2019  1:39 PM   Specimen: Nasopharyngeal Swab  Result Value Ref Range   SARS Coronavirus 2 by RT PCR NEGATIVE NEGATIVE    Comment: (NOTE) SARS-CoV-2 target nucleic acids are NOT DETECTED. The SARS-CoV-2 RNA is generally detectable in upper respiratoy specimens during the acute phase of infection. The lowest concentration of SARS-CoV-2 viral copies this assay can detect is 131 copies/mL. A negative result does not preclude SARS-Cov-2 infection and should not be used as the sole basis for treatment or other patient management decisions. A negative result may occur with  improper specimen collection/handling, submission of specimen other than nasopharyngeal swab, presence of viral mutation(s) within the areas targeted by this assay, and inadequate number of viral copies (<131 copies/mL). A negative result must be combined with clinical observations, patient history, and epidemiological information. The expected result is Negative. Fact Sheet for Patients:  https://www.moore.com/ Fact Sheet for Healthcare Providers:  https://www.young.biz/ This test is not yet ap proved or cleared by the [place] FDA and  has been authorized for detection and/or diagnosis of SARS-CoV-2 by FDA under an Emergency Use Authorization (EUA). This EUA will  remain  in effect (meaning this test can be used) for the duration of the COVID-19 declaration under Section 564(b)(1) of the Act, 21 U.S.C. section 360bbb-3(b)(1), unless the authorization is terminated or revoked sooner.    Influenza A by PCR NEGATIVE NEGATIVE   Influenza B by PCR NEGATIVE NEGATIVE    Comment: (NOTE) The Xpert Xpress SARS-CoV-2/FLU/RSV assay is intended as an aid in  the diagnosis of influenza from Nasopharyngeal swab specimens and  should not be used as a sole basis for treatment. Nasal washings and  aspirates are unacceptable for Xpert Xpress SARS-CoV-2/FLU/RSV  testing. Fact Sheet for Patients: https://www.moore.com/ Fact Sheet for Healthcare Providers: https://www.young.biz/ This test is not yet approved or cleared by the [place] FDA and  has been authorized for detection and/or diagnosis of SARS-CoV-2 by  FDA under an Emergency Use Authorization (EUA). This EUA will remain  in effect (meaning this test can be used) for the duration of the  Covid-19 declaration under Section 564(b)(1) of the Act, 21  U.S.C. section 360bbb-3(b)(1), unless the authorization is  terminated or revoked. Performed at [facility], [address] [place], [place] [address]     CT Code Stroke CTA Head W/WO contrast  Result Date: 11/24/2019 CLINICAL DATA:  Focal neuro deficit, greater than 6  hours, stroke suspected. Additional history provided: Patient brought in by ambulance following well check, patient found on floor by bed with expressive aphasia, facial droop and left-sided weakness. EXAM: CT ANGIOGRAPHY HEAD AND NECK CT PERFUSION BRAIN TECHNIQUE: Multidetector CT imaging of the head and neck was performed using the standard protocol during bolus administration of intravenous contrast. Multiplanar CT image reconstructions and MIPs were obtained to evaluate the vascular anatomy. Carotid stenosis measurements (when applicable) are  obtained utilizing NASCET criteria, using the distal internal carotid diameter as the denominator. Multiphase CT imaging of the brain was performed following IV bolus contrast injection. Subsequent parametric perfusion maps were calculated using RAPID software. CONTRAST:  OMNIPAQUE IOHEXOL 350 MG/ML SOLN COMPARISON:  Brain MRI 07/08/2009 FINDINGS: CT HEAD FINDINGS Brain: There is extensive abnormal cortical/subcortical hypodensity throughout a large portion of the right MCA vascular territory, most notably involving the posterolateral right frontal lobe, right parietal lobe, right temporal lobe as well as right insular cortex and subinsular region. Findings are consistent with acute/early subacute infarct. No evidence of hemorrhagic conversion. No significant mass effect at this time. No midline shift or extra-axial fluid collection. 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 A known now chronic infarct within the left aspect of the midbrain was better appreciated on prior MRI 07/08/2009. Additional ill-defined hypoattenuation within the cerebral white matter is nonspecific, but consistent with chronic small vessel ischemic disease. Multiple chronic lacunar infarcts within the bilateral basal ganglia and possibly within the right thalamus. Mild generalized parenchymal atrophy. Vascular: Reported separate Skull: Normal. Negative for fracture or focal lesion. Sinuses/Orbits: Visualized orbits demonstrate no acute abnormality. Air-fluid level and frothy secretions within the left maxillary sinus. 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 Please correlate with concurrently performed CTA chest/abdomen/pelvis for further description. Mixed plaque within the visualized aortic arch and proximal major branch vessels of the neck. No innominate or proximal subclavian artery  stenosis. 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 Right carotid system: CCA and ICA patent within the neck. Mixed plaque within the carotid bifurcation and proximal ICA with possible ulcerated plaque. Somewhat lobular dilation of the carotid bulb. No significant stenosis of the proximal ICA as compared to the more distal vessel. Left carotid system: CCA and ICA patent within the neck. Mixed plaque within the distal common carotid artery and proximal ICA. No significant stenosis of the proximal ICA as compared to the more distal vessel within the neck. There is irregular lobular dilation of the carotid bulb. There is also prominent tortuosity of the proximal cervical ICA. Vertebral arteries: The vertebral arteries are patent within the neck bilaterally without stenosis. The right vertebral artery is slightly dominant. Skeleton: No acute bony abnormality. Cervical spondylosis with multilevel posterior disc osteophytes, uncovertebral and facet hypertrophy. Other neck: No cervical lymphadenopathy. 18 mm nodule within the right thyroid lobe. Upper chest: Please refer to concurrently performed CTA of the chest for description of intrathoracic findings Review of the MIP images confirms the above findings CTA HEAD FINDINGS Anterior circulation: Intracranial internal carotid arteries are patent bilaterally with scattered mixed plaque. No significant stenosis within the right ICA mild stenosis within the cavernous left ICA. There is abrupt occlusion of the M1 right middle cerebral artery shortly beyond its origin. No significant reconstitution of the M1 right MCA more distally. There is minimal reconstitution  of more distal right MCA arteries. The M1 left middle cerebral artery is patent without significant stenosis. No left M2 proximal branch occlusion or high-grade proximal stenosis is demonstrated. The bilateral anterior cerebral arteries  are patent without high-grade proximal stenosis. No intracranial aneurysm is identified. Posterior circulation: The intracranial right vertebral artery is patent with mild/moderate stenosis in the distal V4 segment. Marked paucity of enhancement within the distal intracranial left vertebral artery just proximal to the vertebrobasilar junction which may reflect high-grade, near occlusive stenosis or focal occlusion. The basilar artery is patent with mild atherosclerotic irregularity. Fetal origin of the left posterior cerebral artery. The bilateral posterior cerebral arteries are patent. Sites of moderate to moderately severe stenosis within the mid P2 posterior cerebral arteries bilaterally. A right posterior communicating artery is not definitively identified and may be hypoplastic or absent. Venous sinuses: Within limitations of contrast timing, no convincing thrombus. Anatomic variants: As described Review of the MIP images confirms the above findings CT Brain Perfusion Findings: CBF (<30%) Volume: 122mL Perfusion (Tmax>6.0s) volume: 139mL Mismatch Volume: 17mL Infarction Location:Right MCA vascular territory Findings of M1 right MCA proximal occlusion and extensive ischemic infarction change throughout the right MCA vascular territory were called by telephone at the time of interpretation on 10/31/2019 at 12:38 pm to provider [facility] , who verbally acknowledged these results. IMPRESSION: CT head: 1. Extensive abnormal hypodensity throughout much of the right MCA vascular territory consistent with acute/early subacute ischemic infarction. No evidence of hemorrhagic conversion. No significant mass effect at this time. 2. Background chronic ischemic changes as detailed 3. Mild generalized parenchymal atrophy. 4. Air-fluid level and frothy secretions within the left maxillary sinus. Correlate for acute sinusitis. CTA neck: 1. The bilateral common carotid, internal carotid and vertebral arteries are patent  within the neck without significant stenosis (50% or greater). 2. Mixed plaque within the carotid bifurcations and proximal internal carotid arteries bilaterally. There is possible ulcerated plaque within the right carotid bulb. There is lobular dilation of the bilateral carotid bulbs. Additionally, there is significant tortuosity of the proximal left ICA. 3. 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 Please correlate with findings on concurrently performed CTA chest/abdomen/pelvis. 4. 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 5. 1.8 cm nodule within the right thyroid lobe. Nonemergent dedicated thyroid ultrasound is recommended for further evaluation, as clinically appropriate. CTA head: 1. 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 No significant reconstitution of the right M1 MCA more distally. There is minimal reconstitution of right M2 and more distal right MCA branches. 2. Posterior circulation atherosclerotic stenoses. Most notably, there is focal near occlusive stenosis versus focal occlusion of the distal left vertebral artery just proximal to the vertebrobasilar junction. Moderate to moderately severe stenoses within the mid P2 posterior cerebral arteries bilaterally. 3. Mild atherosclerotic plaque within the intracranial internal carotid arteries. CT perfusion head: The perfusion software identifies a 122 mL core infarct within the right MCA vascular territory. The perfusion software identifies a 139 mL region of critically hypoperfused parenchyma within the right MCA vascular territory. Reported mismatch volume 17 mL. Electronically Signed   By: Jackey LogeKyle  Golden DO   On: 10/29/2019 13:05   CT Code Stroke CTA Neck W/WO contrast  Result Date: 11/18/2019 CLINICAL DATA:  Focal neuro deficit, greater than 6 hours, stroke suspected. Additional history provided: Patient brought in by ambulance following well check, patient found  on floor by bed with expressive aphasia, facial droop and left-sided weakness.  EXAM: CT ANGIOGRAPHY HEAD AND NECK CT PERFUSION BRAIN TECHNIQUE: Multidetector CT imaging of the head and neck was performed using the standard protocol during bolus administration of intravenous contrast. Multiplanar CT image reconstructions and MIPs were obtained to evaluate the vascular anatomy. Carotid stenosis measurements (when applicable) are obtained utilizing NASCET criteria, using the distal internal carotid diameter as the denominator. Multiphase CT imaging of the brain was performed following IV bolus contrast injection. Subsequent parametric perfusion maps were calculated using RAPID software. CONTRAST:  OMNIPAQUE IOHEXOL 350 MG/ML SOLN COMPARISON:  Brain MRI 07/08/2009 FINDINGS: CT HEAD FINDINGS Brain: There is extensive abnormal cortical/subcortical hypodensity throughout a large portion of the right MCA vascular territory, most notably involving the posterolateral right frontal lobe, right parietal lobe, right temporal lobe as well as right insular cortex and subinsular region. Findings are consistent with acute/early subacute infarct. No evidence of hemorrhagic conversion. No significant mass effect at this time. No midline shift or extra-axial fluid collection. 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 A known now chronic infarct within the left aspect of the midbrain was better appreciated on prior MRI 07/08/2009. Additional ill-defined hypoattenuation within the cerebral white matter is nonspecific, but consistent with chronic small vessel ischemic disease. Multiple chronic lacunar infarcts within the bilateral basal ganglia and possibly within the right thalamus. Mild generalized parenchymal atrophy. Vascular: Reported separate Skull: Normal. Negative for fracture or focal lesion. Sinuses/Orbits: Visualized orbits demonstrate no acute abnormality. Air-fluid level and frothy secretions  within the left maxillary sinus. 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 Please correlate with concurrently performed CTA chest/abdomen/pelvis for further description. Mixed plaque within the visualized aortic arch and proximal major branch vessels of the neck. No innominate or proximal subclavian artery stenosis. 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 Right carotid system: CCA and ICA patent within the neck. Mixed plaque within the carotid bifurcation and proximal ICA with possible ulcerated plaque. Somewhat lobular dilation of the carotid bulb. No significant stenosis of the proximal ICA as compared to the more distal vessel. Left carotid system: CCA and ICA patent within the neck. Mixed plaque within the distal common carotid artery and proximal ICA. No significant stenosis of the proximal ICA as compared to the more distal vessel within the neck. There is irregular lobular dilation of the carotid bulb. There is also prominent tortuosity of the proximal cervical ICA. Vertebral arteries: The vertebral arteries are patent within the neck bilaterally without stenosis. The right vertebral artery is slightly dominant. Skeleton: No acute bony abnormality. Cervical spondylosis with multilevel posterior disc osteophytes, uncovertebral and facet hypertrophy. Other neck: No cervical lymphadenopathy. 18 mm nodule within the right thyroid lobe. Upper chest: Please refer to concurrently performed CTA of the chest for description of intrathoracic findings Review of the MIP images confirms the above findings CTA HEAD FINDINGS Anterior circulation: Intracranial internal carotid arteries are patent bilaterally with scattered mixed plaque. No significant stenosis within the right ICA mild stenosis within the cavernous left  ICA. There is abrupt occlusion of the M1 right middle cerebral artery shortly beyond its origin. No significant reconstitution of the M1 right MCA more distally. There is minimal reconstitution of more distal right MCA arteries. The M1 left middle cerebral artery is patent without significant stenosis. No left M2 proximal branch occlusion or high-grade proximal stenosis is  demonstrated. The bilateral anterior cerebral arteries are patent without high-grade proximal stenosis. No intracranial aneurysm is identified. Posterior circulation: The intracranial right vertebral artery is patent with mild/moderate stenosis in the distal V4 segment. Marked paucity of enhancement within the distal intracranial left vertebral artery just proximal to the vertebrobasilar junction which may reflect high-grade, near occlusive stenosis or focal occlusion. The basilar artery is patent with mild atherosclerotic irregularity. Fetal origin of the left posterior cerebral artery. The bilateral posterior cerebral arteries are patent. Sites of moderate to moderately severe stenosis within the mid P2 posterior cerebral arteries bilaterally. A right posterior communicating artery is not definitively identified and may be hypoplastic or absent. Venous sinuses: Within limitations of contrast timing, no convincing thrombus. Anatomic variants: As described Review of the MIP images confirms the above findings CT Brain Perfusion Findings: CBF (<30%) Volume: Perfusion (Tmax>6.0s) volume: Mismatch Volume: 17mL Infarction Location:Right MCA vascular territory Findings of M1 right MCA proximal occlusion and extensive ischemic infarction change throughout the right MCA vascular territory were called by telephone at the time of interpretation on 02-Dec-2019 at 12:38 pm to provider MCNEILL [facility] , who verbally acknowledged these results. IMPRESSION: CT head: 1. Extensive abnormal hypodensity throughout much of the right MCA vascular  territory consistent with acute/early subacute ischemic infarction. No evidence of hemorrhagic conversion. No significant mass effect at this time. 2. Background chronic ischemic changes as detailed 3. Mild generalized parenchymal atrophy. 4. Air-fluid level and frothy secretions within the left maxillary sinus. Correlate for acute sinusitis. CTA neck: 1. The bilateral common carotid, internal carotid and vertebral arteries are patent within the neck without significant stenosis (50% or greater). 2. Mixed plaque within the carotid bifurcations and proximal internal carotid arteries bilaterally. There is possible ulcerated plaque within the right carotid bulb. There is lobular dilation of the bilateral carotid bulbs. Additionally, there is significant tortuosity of the proximal left ICA. 3. 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 Please correlate with findings on concurrently performed CTA chest/abdomen/pelvis. 4. 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 5. 1.8 cm nodule within the right thyroid lobe. Nonemergent dedicated thyroid ultrasound is recommended for further evaluation, as clinically appropriate. CTA head: 1. 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 No significant reconstitution of the right M1 MCA more distally. There is minimal reconstitution of right M2 and more distal right MCA branches. 2. Posterior circulation atherosclerotic stenoses. Most notably, there is focal near occlusive stenosis versus focal occlusion of the distal left vertebral artery just proximal to the vertebrobasilar junction. Moderate to moderately severe stenoses within the mid P2 posterior cerebral arteries bilaterally. 3. Mild atherosclerotic plaque within the intracranial internal carotid arteries. CT perfusion head: The perfusion software identifies a 122 mL core infarct within the right MCA vascular territory. The perfusion software  identifies a 139 mL region of critically hypoperfused parenchyma within the right MCA vascular territory. Reported mismatch volume 17 mL. Electronically Signed   By: Jackey Loge DO   On: 12/02/2019 13:05   CT C-SPINE NO CHARGE  Result Date: 02-Dec-2019 CLINICAL DATA:  Found down EXAM: CT CERVICAL SPINE WITHOUT CONTRAST TECHNIQUE: Multidetector CT imaging of the cervical spine was performed without intravenous contrast. Multiplanar CT image reconstructions were also generated. COMPARISON:  None. FINDINGS: Alignment: Normal. Skull base and vertebrae: No acute fracture. No primary bone lesion or focal pathologic process. Soft tissues and spinal canal: No prevertebral fluid or swelling. No visible canal hematoma. Disc levels: Intervertebral  disc height loss and degenerative endplate changes most pronounced at C6-7. Multilevel facet arthropathy with advanced bilateral foraminal narrowing at C6-7 and right foraminal narrowing at C5-6. Upper chest: Centrilobular emphysema. Thoracic aortic aneurysm. Other: 1.8 cm right thyroid lobe nodule. IMPRESSION: 1. No acute fracture or dislocation of the cervical spine. 2. Multilevel degenerative disc disease and facet arthropathy. 3. Thoracic aortic aneurysm. Please refer to forthcoming CT angiogram report for full evaluation of the vascular findings. 4. 1.8 cm right thyroid lobe nodule. Dedicated thyroid ultrasound is recommended on a nonemergent basis. 5. Centrilobular emphysema. Emphysema (ICD10-J43.9). Electronically Signed   By: Duanne Guess D.O.   On: 12/05/19 12:40   CT Code Stroke Cerebral Perfusion with contrast  Result Date: 12/05/2019 CLINICAL DATA:  Focal neuro deficit, greater than 6 hours, stroke suspected. Additional history provided: Patient brought in by ambulance following well check, patient found on floor by bed with expressive aphasia, facial droop and left-sided weakness. EXAM: CT ANGIOGRAPHY HEAD AND NECK CT PERFUSION BRAIN TECHNIQUE:  Multidetector CT imaging of the head and neck was performed using the standard protocol during bolus administration of intravenous contrast. Multiplanar CT image reconstructions and MIPs were obtained to evaluate the vascular anatomy. Carotid stenosis measurements (when applicable) are obtained utilizing NASCET criteria, using the distal internal carotid diameter as the denominator. Multiphase CT imaging of the brain was performed following IV bolus contrast injection. Subsequent parametric perfusion maps were calculated using RAPID software. CONTRAST:  OMNIPAQUE IOHEXOL 350 MG/ML SOLN COMPARISON:  Brain MRI 07/08/2009 FINDINGS: CT HEAD FINDINGS Brain: There is extensive abnormal cortical/subcortical hypodensity throughout a large portion of the right MCA vascular territory, most notably involving the posterolateral right frontal lobe, right parietal lobe, right temporal lobe as well as right insular cortex and subinsular region. Findings are consistent with acute/early subacute infarct. No evidence of hemorrhagic conversion. No significant mass effect at this time. No midline shift or extra-axial fluid collection. 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 A known now chronic infarct within the left aspect of the midbrain was better appreciated on prior MRI 07/08/2009. Additional ill-defined hypoattenuation within the cerebral white matter is nonspecific, but consistent with chronic small vessel ischemic disease. Multiple chronic lacunar infarcts within the bilateral basal ganglia and possibly within the right thalamus. Mild generalized parenchymal atrophy. Vascular: Reported separate Skull: Normal. Negative for fracture or focal lesion. Sinuses/Orbits: Visualized orbits demonstrate no acute abnormality. Air-fluid level and frothy secretions within the left maxillary sinus. Mucosal thickening is also present within the bilateral sphenoid sinuses CTA NECK FINDINGS Aortic arch:  Aneurysmal dilation of the aortic arch and descending aorta. Please correlate with concurrently performed CTA chest/abdomen/pelvis for further description. Mixed plaque within the visualized aortic arch and proximal major branch vessels of the neck. No innominate or proximal subclavian artery stenosis. 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 Right carotid system: CCA and ICA patent within the neck. Mixed plaque within the carotid bifurcation and proximal ICA with possible ulcerated plaque. Somewhat lobular dilation of the carotid bulb. No significant stenosis of the proximal ICA as compared to the more distal vessel. Left carotid system: CCA and ICA patent within the neck. Mixed plaque within the distal common carotid artery and proximal ICA. No significant stenosis of the proximal ICA as compared to the more distal vessel within the neck. There is irregular lobular dilation of the carotid bulb. There is also prominent tortuosity of the proximal  cervical ICA. Vertebral arteries: The vertebral arteries are patent within the neck bilaterally without stenosis. The right vertebral artery is slightly dominant. Skeleton: No acute bony abnormality. Cervical spondylosis with multilevel posterior disc osteophytes, uncovertebral and facet hypertrophy. Other neck: No cervical lymphadenopathy. 18 mm nodule within the right thyroid lobe. Upper chest: Please refer to concurrently performed CTA of the chest for description of intrathoracic findings Review of the MIP images confirms the above findings CTA HEAD FINDINGS Anterior circulation: Intracranial internal carotid arteries are patent bilaterally with scattered mixed plaque. No significant stenosis within the right ICA mild stenosis within the cavernous left ICA. There is abrupt occlusion of the M1 right middle cerebral artery shortly beyond its origin. No significant reconstitution of the M1  right MCA more distally. There is minimal reconstitution of more distal right MCA arteries. The M1 left middle cerebral artery is patent without significant stenosis. No left M2 proximal branch occlusion or high-grade proximal stenosis is demonstrated. The bilateral anterior cerebral arteries are patent without high-grade proximal stenosis. No intracranial aneurysm is identified. Posterior circulation: The intracranial right vertebral artery is patent with mild/moderate stenosis in the distal V4 segment. Marked paucity of enhancement within the distal intracranial left vertebral artery just proximal to the vertebrobasilar junction which may reflect high-grade, near occlusive stenosis or focal occlusion. The basilar artery is patent with mild atherosclerotic irregularity. Fetal origin of the left posterior cerebral artery. The bilateral posterior cerebral arteries are patent. Sites of moderate to moderately severe stenosis within the mid P2 posterior cerebral arteries bilaterally. A right posterior communicating artery is not definitively identified and may be hypoplastic or absent. Venous sinuses: Within limitations of contrast timing, no convincing thrombus. Anatomic variants: As described Review of the MIP images confirms the above findings CT Brain Perfusion Findings: CBF (<30%) Volume: Perfusion (Tmax>6.0s) volume: Mismatch Volume: 17mL Infarction Location:Right MCA vascular territory Findings of M1 right MCA proximal occlusion and extensive ischemic infarction change throughout the right MCA vascular territory were called by telephone at the time of interpretation on 11/08/2019 at 12:38 pm to provider [facility] , who verbally acknowledged these results. IMPRESSION: CT head: 1. Extensive abnormal hypodensity throughout much of the right MCA vascular territory consistent with acute/early subacute ischemic infarction. No evidence of hemorrhagic conversion. No significant mass effect at this  time. 2. Background chronic ischemic changes as detailed 3. Mild generalized parenchymal atrophy. 4. Air-fluid level and frothy secretions within the left maxillary sinus. Correlate for acute sinusitis. CTA neck: 1. The bilateral common carotid, internal carotid and vertebral arteries are patent within the neck without significant stenosis (50% or greater). 2. Mixed plaque within the carotid bifurcations and proximal internal carotid arteries bilaterally. There is possible ulcerated plaque within the right carotid bulb. There is lobular dilation of the bilateral carotid bulbs. Additionally, there is significant tortuosity of the proximal left ICA. 3. 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 Please correlate with findings on concurrently performed CTA chest/abdomen/pelvis. 4. 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 5. 1.8 cm nodule within the right thyroid lobe. Nonemergent dedicated thyroid ultrasound is recommended for further evaluation, as clinically appropriate. CTA head: 1. 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 No significant reconstitution of the right M1 MCA more distally. There is minimal reconstitution of right M2 and more distal right MCA branches. 2. Posterior circulation atherosclerotic stenoses. Most notably, there is focal near occlusive stenosis versus focal occlusion of the distal left vertebral artery  just proximal to the vertebrobasilar junction. Moderate to moderately severe stenoses within the mid P2 posterior cerebral arteries bilaterally. 3. Mild atherosclerotic plaque within the intracranial internal carotid arteries. CT perfusion head: The perfusion software identifies a 122 mL core infarct within the right MCA vascular territory. The perfusion software identifies a 139 mL region of critically hypoperfused parenchyma within the right MCA vascular territory. Reported mismatch volume 17 mL.  Electronically Signed   By: Jackey Loge DO   On: 11/13/2019 13:05   DG Chest Port 1 View  Result Date: 10/31/2019 CLINICAL DATA:  Altered mental status EXAM: PORTABLE CHEST 1 VIEW COMPARISON:  None. FINDINGS: Patient rotated. Lungs are clear. Heart size and pulmonary vascularity are normal. No adenopathy. There is prominence of the descending thoracic aorta with questionable aneurysmal dilatation in the mid to distal descending aorta. No adenopathy. IMPRESSION: 1.  Lungs clear. 2. Prominence of the descending aorta with questionable degree of aneurysmal dilatation distally. Electronically Signed   By: Bretta Bang III M.D.   On: 11/18/2019 10:44   ECHOCARDIOGRAM COMPLETE  Result Date: 11/15/2019    ECHOCARDIOGRAM REPORT   Patient Name:   ENEZ HARJO Date of Exam: 11/09/2019 Medical Rec #:  [id number]       Height:       61.0 in Accession #:    [id number]      Weight:       158.4 lb Date of Birth:  October 19, 1955       BSA:          1.71 m Patient Age:    63 years        BP:           144/96 mmHg Patient Gender: F               HR:           120 bpm. Exam Location:  Inpatient Procedure: 2D Echo, Cardiac Doppler and Color Doppler Indications:    Stroke 434.91 / I163.9  History:        Patient has no prior history of Echocardiogram examinations.                 Stroke, Signs/Symptoms:Dyspnea; Risk Factors:Dyslipidemia.  Sonographer:    Elmarie Shiley Dance Referring Phys: Tara.Kingdom MCNEILL P KIRKPATRICK IMPRESSIONS  1. Left ventricular ejection fraction, by estimation, is 60 to 65%. The left ventricle has normal function. The left ventricle has no regional wall motion abnormalities. Indeterminate diastolic filling due to E-A fusion.  2. Right ventricular systolic function is normal. The right ventricular size is normal. Tricuspid regurgitation signal is inadequate for assessing PA pressure.  3. The mitral valve is normal in structure and function. No evidence of mitral valve regurgitation. No evidence of mitral  stenosis.  4. The aortic valve is tricuspid. Aortic valve regurgitation is not visualized. Mild aortic valve sclerosis is present, with no evidence of aortic valve stenosis.  5. The inferior vena cava is normal in size with greater than 50% respiratory variability, suggesting right atrial pressure of 3 mmHg.  6. Technically difficult study with poor acoustic windows. FINDINGS  Left Ventricle: Left ventricular ejection fraction, by estimation, is 60 to 65%. The left ventricle has normal function. The left ventricle has no regional wall motion abnormalities. The left ventricular internal cavity size was normal in size. There is  no left ventricular hypertrophy. Indeterminate diastolic filling due to E-A fusion. Right Ventricle: The right ventricular size is normal. No increase in right ventricular wall  thickness. Right ventricular systolic function is normal. Tricuspid regurgitation signal is inadequate for assessing PA pressure. Left Atrium: Left atrial size was normal in size. Right Atrium: Right atrial size was normal in size. Pericardium: There is no evidence of pericardial effusion. Mitral Valve: The mitral valve is normal in structure and function. Mild mitral annular calcification. No evidence of mitral valve regurgitation. No evidence of mitral valve stenosis. Tricuspid Valve: The tricuspid valve is normal in structure. Tricuspid valve regurgitation is not demonstrated. Aortic Valve: The aortic valve is tricuspid. Aortic valve regurgitation is not visualized. Mild aortic valve sclerosis is present, with no evidence of aortic valve stenosis. Pulmonic Valve: The pulmonic valve was not well visualized. Pulmonic valve regurgitation is not visualized. Aorta: The aortic root is normal in size and structure. Venous: The inferior vena cava is normal in size with greater than 50% respiratory variability, suggesting right atrial pressure of 3 mmHg. IAS/Shunts: No atrial level shunt detected by color flow Doppler.  LEFT  VENTRICLE PLAX 2D LVIDd:         4.20 cm LVIDs:         3.30 cm LV PW:         1.00 cm LV IVS:        0.80 cm LVOT diam:     1.80 cm LV SV:         44.28 ml LV SV Index:   19.30 LVOT Area:     2.54 cm  RIGHT VENTRICLE             IVC RV Basal diam:  2.30 cm     IVC diam: 1.30 cm RV S prime:     11.20 cm/s TAPSE (M-mode): 1.8 cm LEFT ATRIUM             Index       RIGHT ATRIUM           Index LA diam:        2.90 cm 1.70 cm/m  RA Area:     10.30 cm LA Vol (A2C):   23.2 ml 13.56 ml/m RA Volume:   20.20 ml  11.81 ml/m LA Vol (A4C):   26.0 ml 15.20 ml/m LA Biplane Vol: 26.0 ml 15.20 ml/m  AORTIC VALVE LVOT Vmax:   122.00 cm/s LVOT Vmean:  76.600 cm/s LVOT VTI:    0.174 m  AORTA Ao Root diam: 3.10 cm Ao Asc diam:  3.10 cm MITRAL VALVE MV Area (PHT): 5.14 cm     SHUNTS MV Decel Time: 148 msec     Systemic VTI:  0.17 m MV E velocity: 128.50 cm/s  Systemic Diam: 1.80 cm Marca Ancona MD Electronically signed by Marca Ancona MD Signature Date/Time: 10/29/2019/4:56:42 PM    Final    CT Angio Chest/Abd/Pel for Dissection W and/or W/WO  Result Date: 10/30/2019 CLINICAL DATA:  Chest and abdominal pain. EXAM: CT ANGIOGRAPHY CHEST, ABDOMEN AND PELVIS TECHNIQUE: Multidetector CT imaging through the chest, abdomen and pelvis was performed using the standard protocol during bolus administration of intravenous contrast. Multiplanar reconstructed images and MIPs were obtained and reviewed to evaluate the vascular anatomy. CONTRAST:  OMNIPAQUE IOHEXOL 350 MG/ML SOLN COMPARISON:  Chest x-ray 11/03/2019 FINDINGS: CTA CHEST FINDINGS Cardiovascular: The heart is normal in size. No pericardial effusion. There is fusiform aneurysmal dilatation of the descending thoracic aorta beginning at the posterior arch. Maximum transverse diameter at the posterior arches. Maximum diameter distally just above the aortic hiatus is 4.4 cm with significant mural  thrombus. No dissection or aortic rupture is identified. No intramural  hematoma. The aortic branch vessels are patent. Three-vessel coronary artery calcifications are noted. Mediastinum/Nodes: No mediastinal or hilar mass or adenopathy. The esophagus is grossly normal. Lungs/Pleura: Emphysematous changes and pulmonary scarring. No worrisome pulmonary lesions or acute pulmonary findings. Musculoskeletal: No chest wall mass, breast masses, supraclavicular or axillary adenopathy. There is a 17 mm right thyroid nodule which will require further evaluation with ultrasound and potentially need to be biopsied. Other smaller thyroid nodules are noted bilaterally. Review of the MIP images confirms the above findings. CTA ABDOMEN AND PELVIS FINDINGS VASCULAR Aorta: Tortuosity, ectasia and calcification of the abdominal aorta. Maximum diameter is 4.8 cm. There is extensive mural thrombus and areas of mural thrombus calcification. No dissection. Distally, just above the iliac artery bifurcation the aorta is occluded. Just above the occlusion there are large lumbar collaterals which help reconstitute both common iliac arteries. Celiac: Normal SMA: Normal Renals: Right renal artery calcifications but no significant stenosis. The left renal artery is patent. IMA: Patent. 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 Moderate atherosclerotic calcifications. Veins: Grossly normal. Review of the MIP images confirms the above findings. NON-VASCULAR Hepatobiliary: No focal hepatic lesions are identified. The liver contour is slightly irregular, the hepatic fissures are dilated and the caudate lobe is enlarged all suggesting changes of cirrhosis. Recanalized umbilical vein. Pancreas: No mass, inflammation or ductal dilatation. Spleen: Normal size. No focal lesions. Adrenals/Urinary Tract: The adrenal glands and kidneys are unremarkable. The bladder appears normal. Stomach/Bowel: The stomach, duodenum, small bowel  and colon are grossly normal without oral contrast. The terminal ileum is normal. The appendix is normal. Scattered colonic diverticulosis but no findings for acute diverticulitis. Lymphatic: No abdominal or pelvic lymphadenopathy. Reproductive: The uterus and ovaries are normal. Other: No pelvic mass or pelvic adenopathy. No free pelvic fluid collections. Musculoskeletal: No significant bony findings. Review of the MIP images confirms the above findings. IMPRESSION: 1. 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 No dissection or intramural hematoma. 2. 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 Recommend followup by abdomen and pelvis CTA in 6 months, and vascular surgery referral/consultation if not already obtained. This recommendation follows ACR consensus guidelines: White Paper of the ACR Incidental Findings Committee II on Vascular Findings. J Am Coll Radiol 2013; 10:789-794. Aortic aneurysm NOS (ICD10-I71.9) 3. 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 4. Cirrhotic changes involving the liver. No worrisome hepatic lesions. 5. Emphysematous changes and pulmonary scarring but no acute pulmonary findings. 6. 17 mm right thyroid nodule. Recommend thyroid US and potential biopsy (ref: J Am Coll Radiol. 2015 Feb;12(2): 143-50). Aortic Atherosclerosis (ICD10-I70.0) and Emphysema (ICD10-J43.9). Electronically Signed   By: Rudie Meyer M.D.   On: 2019-12-04 12:56    Review of Systems  Unable to perform ROS: Acuity of condition   Blood pressure (!) 167/99, pulse (!) 101, temperature 99.2 F (37.3 C), temperature source Oral, resp. rate (!) 21, SpO2 98 %. Physical Exam  Constitutional: She appears well-developed and well-nourished. She appears distressed.  HENT:  Head: Normocephalic and atraumatic.  Eyes: Pupils are equal,  round, and reactive to light.  Neurological: She is alert. GCS eye subscore is 4. GCS verbal subscore is 5. GCS motor subscore is 6.  Left facial droop, tongue deviated  to left Dense plegia on the left side Moving right side normally Able to hold up fingers reliably to questioning   speech is garbled, cannot understand dysarthria  Assessment/Plan: OR for emergent craniotomy for decompression right cerebral hemisphere I have spoken with her niece, and to the patient.   Coletta MemosKyle Mariselda Badalamenti 2020-03-11, 5:01 PM

## 2019-11-14 NOTE — ED Notes (Signed)
Pt gives consent for staff to speak to and update the pt's friend Lawerance Cruel at [phone].

## 2019-11-14 NOTE — ED Triage Notes (Signed)
Pt bib ems after friend called for a well check. Per ems pt talks to friend every morning around 0600. The friend could not get in touch with pt today. Ems found pt on floor by bed. Pt with expressive aphasia, facial droop and L sided weakness.  174/100 HR 112 RR 16 99.27F CBG 136

## 2019-11-14 NOTE — Transfer of Care (Signed)
Immediate Anesthesia Transfer of Care Note  Patient: Paula Kim  Procedure(s) Performed: Right HEMICRANIECTOMY for cerebral decompression (Right Head)  Patient Location: ICU  Anesthesia Type:General  Level of Consciousness: sedated and unresponsive  Airway & Oxygen Therapy: Patient remains intubated per anesthesia plan and Patient placed on Ventilator (see vital sign flow sheet for setting)  Post-op Assessment: Report given to RN and Post -op Vital signs reviewed and stable  Post vital signs: Reviewed and stable  Last Vitals:  Vitals Value Taken Time  BP 134/86 12-05-2019 2038  Temp    Pulse 71 12-05-19 2030  Resp 16 12/05/2019 2039  SpO2 98 % 2019-12-05 2030  Vitals shown include unvalidated device data.  Last Pain:  Vitals:   December 05, 2019 1031  TempSrc:   PainSc: 0-No pain         Complications: No apparent anesthesia complications

## 2019-11-14 NOTE — ED Notes (Signed)
Patient transported to CT 

## 2019-11-14 NOTE — ED Notes (Signed)
Echo at the bedside °

## 2019-11-14 NOTE — ED Provider Notes (Signed)
[facility] EMERGENCY DEPARTMENT Provider Note   CSN: [id number] Arrival date & time: 10/30/2019  1018     History Chief Complaint  Patient presents with  . Stroke Symptoms    Paula Kim is a 64 y.o. female with a history of prior stroke w/o documented residual deficits, anemia, hyperlipidemia, & depression who presents to the ED via EMS for stroke like sxs. History is provided by EMS- received call from patient's friend who she talks on the phone with each morning that this AM she had called the patient multiple times without response, she became concerned and asked that someone check on her. Last known normal was 0700 yesterday. Per EMS found patient down, aphasic, L sided weakness. No intervention PTA. History limited by patient's aphasia, Level 5 Caveat applies.   HPI     Past Medical History:  Diagnosis Date  . Anemia    AS TEENAGER  . Depression   . Gallstones   . Hyperlipidemia   . Nocturia   . PONV (postoperative nausea and vomiting)   . Shortness of breath    WITH EXERTION  . 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 Stroke [facility])    2009 - NO RESIDUAL PROBLEMS    Patient Active Problem List   Diagnosis Date Noted  . Stroke (cerebrum) (HCC) 11/19/2019    Past Surgical History:  Procedure Laterality Date  . CHOLECYSTECTOMY  03/16/2012   Procedure: LAPAROSCOPIC CHOLECYSTECTOMY WITH INTRAOPERATIVE CHOLANGIOGRAM;  Surgeon: Lodema Pilot, DO;  Location: [facility] ORS;  Service: General;  Laterality: N/A;  Laparoscopic Cholecystectomy,intraoperative Cholangiogram  . ERCP  03/16/2012   Procedure: ENDOSCOPIC RETROGRADE CHOLANGIOPANCREATOGRAPHY (ERCP);  Surgeon: Theda Belfast, MD;  Location: [facility];  Service: Endoscopy;  Laterality: N/A;  . TUBAL LIGATION       OB History   No obstetric history on file.     Family History  Problem Relation Age of Onset  . COPD Father   . Cancer Maternal Grandfather         colon    Social History   Tobacco Use  . Smoking status: Former Smoker    Quit date: 01/19/2006    Years since quitting: 13.8  Substance Use Topics  . Alcohol use: No  . Drug use: No    Home Medications Prior to Admission medications   Medication Sig Start Date End Date Taking? Authorizing Provider  aspirin EC 325 MG tablet Take 325 mg by mouth daily.    [provider]  famotidine (PEPCID) 20 MG tablet Take 20 mg by mouth 2 (two) times daily.    [provider]  fenofibrate micronized (LOFIBRA) 200 MG capsule Take 200 mg by mouth daily.    [provider]  fish oil-omega-3 fatty acids 1000 MG capsule Take 1 g by mouth 2 (two) times daily.    [provider]  methylcellulose (ARTIFICIAL TEARS) 1 % ophthalmic solution Place 1 drop into both eyes as needed. Dry eyes    [provider]  niacin (SLO-NIACIN) 500 MG tablet Take 500 mg by mouth 2 (two) times daily with a meal.    [provider]  pravastatin (PRAVACHOL) 40 MG tablet Take 40 mg by mouth daily.    [provider]  venlafaxine (EFFEXOR-XR) 150 MG 24 hr capsule Take 150 mg by mouth daily.    [provider]  vitamin B-12 (CYANOCOBALAMIN) 500 MCG tablet Take 500 mcg  by mouth 2 (two) times daily.    [provider]  zolpidem (AMBIEN) 10 MG tablet Take 10 mg by mouth at bedtime as needed. For sleep.    [provider]    Allergies    Erythromycin  Review of Systems   Review of Systems Unable to perform ROS: Acuity of condition  & aphasia   Physical Exam Updated Vital Signs BP (!) 144/96   Pulse 99   Temp 99.2 F (37.3 C) (Oral)   Resp (!) 21   SpO2 100%   Physical Exam Vitals and nursing note reviewed.  Constitutional:  Appearance: She is not toxic-appearing.  HENT:  Head: Normocephalic and atraumatic.  Comments: No racoon eyes or battle sign.  Mouth/Throat:  Mouth: Mucous membranes are dry.  Eyes:  Comments: Gaze  deviated to the right  Neck:  Comments: C-collar in place.  Cardiovascular:  Rate and Rhythm: Regular rhythm. Tachycardia present.  Pulmonary:  Effort: Pulmonary effort is normal.  Breath sounds: Normal breath sounds.  Chest:  Chest wall: No tenderness.  Abdominal:  General: There is no distension.  Palpations: Abdomen is soft.  Tenderness: There is no abdominal tenderness. There is no guarding or rebound.  Skin:  General: Skin is warm and dry.  Neurological:  Mental Status: She is alert.  Comments: Alert. Aphasic. Gaze deviated to the right. L sided facial droop. Significant L hand grip weakness compared to R. Only able to hold left UE/LE up very briefly. Good strength with plantar/dorsiflexion bilaterally. Patient following some commands.    ED Results / Procedures / Treatments   Labs (all labs ordered are listed, but only abnormal results are displayed) Labs Reviewed  APTT - Abnormal; Notable for the following components:      Result Value   aPTT 20 (*)    All other components within normal limits  COMPREHENSIVE METABOLIC PANEL - Abnormal; Notable for the following components:   Potassium 3.2 (*)    Glucose, Bld 127 (*)    All other components within normal limits  RAPID URINE DRUG SCREEN, HOSP PERFORMED - Abnormal; Notable for the following components:   Tetrahydrocannabinol POSITIVE (*)    All other components within normal limits  URINALYSIS, ROUTINE W REFLEX MICROSCOPIC - Abnormal; Notable for the following components:   Specific Gravity, Urine >1.046 (*)    Ketones, ur 5 (*)    All other components within normal limits  CBC WITH DIFFERENTIAL/PLATELET - Abnormal; Notable for the following components:   WBC 11.1 (*)    Neutro Abs 9.5 (*)    All other components within normal limits  CBG MONITORING, ED - Abnormal; Notable for the following components:   Glucose-Capillary 124 (*)    All other components within normal limits  I-STAT CHEM 8, ED - Abnormal; Notable for  the following components:   Potassium 3.0 (*)    Glucose, Bld 112 (*)    Calcium, Ion 1.09 (*)    All other components within normal limits  RESPIRATORY PANEL BY RT PCR (FLU A&B, COVID)  ETHANOL  PROTIME-INR  HIV ANTIBODY (ROUTINE TESTING W REFLEX)  SODIUM  SODIUM  SODIUM    EKG None  Radiology CT Code Stroke CTA Head W/WO contrast  Result Date: November 27, 2019 CLINICAL DATA:  Focal neuro deficit, greater than 6 hours, stroke suspected. Additional history provided: Patient brought in by ambulance following well check, patient found on floor by bed with expressive aphasia, facial droop and left-sided weakness. EXAM: CT ANGIOGRAPHY HEAD AND NECK CT  PERFUSION BRAIN TECHNIQUE: Multidetector CT imaging of the head and neck was performed using the standard protocol during bolus administration of intravenous contrast. Multiplanar CT image reconstructions and MIPs were obtained to evaluate the vascular anatomy. Carotid stenosis measurements (when applicable) are obtained utilizing NASCET criteria, using the distal internal carotid diameter as the denominator. Multiphase CT imaging of the brain was performed following IV bolus contrast injection. Subsequent parametric perfusion maps were calculated using RAPID software. CONTRAST:  150mL OMNIPAQUE IOHEXOL 350 MG/ML SOLN COMPARISON:  Brain MRI 07/08/2009 FINDINGS: CT HEAD FINDINGS Brain: There is extensive abnormal cortical/subcortical hypodensity throughout a large portion of the right MCA vascular territory, most notably involving the posterolateral right frontal lobe, right parietal lobe, right temporal lobe as well as right insular cortex and subinsular region. Findings are consistent with acute/early subacute infarct. No evidence of hemorrhagic conversion. No significant mass effect at this time. No midline shift or extra-axial fluid collection. 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 A known now chronic infarct within the left  aspect of the midbrain was better appreciated on prior MRI 07/08/2009. Additional ill-defined hypoattenuation within the cerebral white matter is nonspecific, but consistent with chronic small vessel ischemic disease. Multiple chronic lacunar infarcts within the bilateral basal ganglia and possibly within the right thalamus. Mild generalized parenchymal atrophy. Vascular: Reported separate Skull: Normal. Negative for fracture or focal lesion. Sinuses/Orbits: Visualized orbits demonstrate no acute abnormality. Air-fluid level and frothy secretions within the left maxillary sinus. 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 Please correlate with concurrently performed CTA chest/abdomen/pelvis for further description. Mixed plaque within the visualized aortic arch and proximal major branch vessels of the neck. No innominate or proximal subclavian artery stenosis. 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 Right carotid system: CCA and ICA patent within the neck. Mixed plaque within the carotid bifurcation and proximal ICA with possible ulcerated plaque. Somewhat lobular dilation of the carotid bulb. No significant stenosis of the proximal ICA as compared to the more distal vessel. Left carotid system: CCA and ICA patent within the neck. Mixed plaque within the distal common carotid artery and proximal ICA. No significant stenosis of the proximal ICA as compared to the more distal vessel within the neck. There is irregular lobular dilation of the carotid bulb. There is also prominent tortuosity of the proximal cervical ICA. Vertebral arteries: The vertebral arteries are patent within the neck bilaterally without stenosis. The right vertebral artery is slightly dominant. Skeleton: No acute bony abnormality. Cervical  spondylosis with multilevel posterior disc osteophytes, uncovertebral and facet hypertrophy. Other neck: No cervical lymphadenopathy. 18 mm nodule within the right thyroid lobe. Upper chest: Please refer to concurrently performed CTA of the chest for description of intrathoracic findings Review of the MIP images confirms the above findings CTA HEAD FINDINGS Anterior circulation: Intracranial internal carotid arteries are patent bilaterally with scattered mixed plaque. No significant stenosis within the right ICA mild stenosis within the cavernous left ICA. There is abrupt occlusion of the M1 right middle cerebral artery shortly beyond its origin. No significant reconstitution of the M1 right MCA more distally. There is minimal reconstitution of more distal right MCA arteries. The M1 left middle cerebral artery is patent without significant stenosis. No left M2 proximal branch occlusion or high-grade proximal stenosis is demonstrated. The bilateral anterior cerebral arteries are  patent without high-grade proximal stenosis. No intracranial aneurysm is identified. Posterior circulation: The intracranial right vertebral artery is patent with mild/moderate stenosis in the distal V4 segment. Marked paucity of enhancement within the distal intracranial left vertebral artery just proximal to the vertebrobasilar junction which may reflect high-grade, near occlusive stenosis or focal occlusion. The basilar artery is patent with mild atherosclerotic irregularity. Fetal origin of the left posterior cerebral artery. The bilateral posterior cerebral arteries are patent. Sites of moderate to moderately severe stenosis within the mid P2 posterior cerebral arteries bilaterally. A right posterior communicating artery is not definitively identified and may be hypoplastic or absent. Venous sinuses: Within limitations of contrast timing, no convincing thrombus. Anatomic variants: As described Review of the MIP images confirms the above  findings CT Brain Perfusion Findings: CBF (<30%) Volume: Perfusion (Tmax>6.0s) volume: Mismatch Volume: 48mL Infarction Location:Right MCA vascular territory Findings of M1 right MCA proximal occlusion and extensive ischemic infarction change throughout the right MCA vascular territory were called by telephone at the time of interpretation on 11/23/2019 at 12:38 pm to provider [facility] , who verbally acknowledged these results. IMPRESSION: CT head: 1. Extensive abnormal hypodensity throughout much of the right MCA vascular territory consistent with acute/early subacute ischemic infarction. No evidence of hemorrhagic conversion. No significant mass effect at this time. 2. Background chronic ischemic changes as detailed 3. Mild generalized parenchymal atrophy. 4. Air-fluid level and frothy secretions within the left maxillary sinus. Correlate for acute sinusitis. CTA neck: 1. The bilateral common carotid, internal carotid and vertebral arteries are patent within the neck without significant stenosis (50% or greater). 2. Mixed plaque within the carotid bifurcations and proximal internal carotid arteries bilaterally. There is possible ulcerated plaque within the right carotid bulb. There is lobular dilation of the bilateral carotid bulbs. Additionally, there is significant tortuosity of the proximal left ICA. 3. 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 Please correlate with findings on concurrently performed CTA chest/abdomen/pelvis. 4. 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 5. 1.8 cm nodule within the right thyroid lobe. Nonemergent dedicated thyroid ultrasound is recommended for further evaluation, as clinically appropriate. CTA head: 1. 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 No significant reconstitution of the right M1 MCA more distally. There is minimal reconstitution of right M2 and more distal right  MCA branches. 2. Posterior circulation atherosclerotic stenoses. Most notably, there is focal near occlusive stenosis versus focal occlusion of the distal left vertebral artery just proximal to the vertebrobasilar junction. Moderate to moderately severe stenoses within the mid P2 posterior cerebral arteries bilaterally. 3. Mild atherosclerotic plaque within the intracranial internal carotid arteries. CT perfusion head: The perfusion software identifies a 122 mL core infarct within the right MCA vascular territory. The perfusion software identifies a 139 mL region of critically hypoperfused parenchyma within the right MCA vascular territory. Reported mismatch volume 17 mL. Electronically Signed   By: Jackey Loge DO   On: 11/06/2019 13:05   CT Code Stroke CTA Neck W/WO contrast  Result Date: 11/23/2019 CLINICAL DATA:  Focal neuro deficit, greater than 6 hours, stroke suspected. Additional history provided: Patient brought in by ambulance following well check, patient found on floor by bed with expressive aphasia, facial droop and left-sided weakness. EXAM: CT ANGIOGRAPHY HEAD AND NECK CT PERFUSION BRAIN TECHNIQUE: Multidetector CT imaging of the head and neck was performed using the standard protocol during bolus administration of intravenous contrast. Multiplanar CT image reconstructions and MIPs  were obtained to evaluate the vascular anatomy. Carotid stenosis measurements (when applicable) are obtained utilizing NASCET criteria, using the distal internal carotid diameter as the denominator. Multiphase CT imaging of the brain was performed following IV bolus contrast injection. Subsequent parametric perfusion maps were calculated using RAPID software. CONTRAST:  OMNIPAQUE IOHEXOL 350 MG/ML SOLN COMPARISON:  Brain MRI 07/08/2009 FINDINGS: CT HEAD FINDINGS Brain: There is extensive abnormal cortical/subcortical hypodensity throughout a large portion of the right MCA vascular territory, most notably involving  the posterolateral right frontal lobe, right parietal lobe, right temporal lobe as well as right insular cortex and subinsular region. Findings are consistent with acute/early subacute infarct. No evidence of hemorrhagic conversion. No significant mass effect at this time. No midline shift or extra-axial fluid collection. 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 A known now chronic infarct within the left aspect of the midbrain was better appreciated on prior MRI 07/08/2009. Additional ill-defined hypoattenuation within the cerebral white matter is nonspecific, but consistent with chronic small vessel ischemic disease. Multiple chronic lacunar infarcts within the bilateral basal ganglia and possibly within the right thalamus. Mild generalized parenchymal atrophy. Vascular: Reported separate Skull: Normal. Negative for fracture or focal lesion. Sinuses/Orbits: Visualized orbits demonstrate no acute abnormality. Air-fluid level and frothy secretions within the left maxillary sinus. 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 Please correlate with concurrently performed CTA chest/abdomen/pelvis for further description. Mixed plaque within the visualized aortic arch and proximal major branch vessels of the neck. No innominate or proximal subclavian artery stenosis. 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 Right carotid system: CCA and ICA patent within the neck. Mixed plaque within the carotid bifurcation and proximal ICA with possible ulcerated plaque. Somewhat lobular dilation of the carotid bulb. No significant stenosis of the proximal ICA as compared to the more distal vessel. Left carotid system: CCA and ICA patent within the neck. Mixed plaque within the distal common carotid artery and  proximal ICA. No significant stenosis of the proximal ICA as compared to the more distal vessel within the neck. There is irregular lobular dilation of the carotid bulb. There is also prominent tortuosity of the proximal cervical ICA. Vertebral arteries: The vertebral arteries are patent within the neck bilaterally without stenosis. The right vertebral artery is slightly dominant. Skeleton: No acute bony abnormality. Cervical spondylosis with multilevel posterior disc osteophytes, uncovertebral and facet hypertrophy. Other neck: No cervical lymphadenopathy. 18 mm nodule within the right thyroid lobe. Upper chest: Please refer to concurrently performed CTA of the chest for description of intrathoracic findings Review of the MIP images confirms the above findings CTA HEAD FINDINGS Anterior circulation: Intracranial internal carotid arteries are patent bilaterally with scattered mixed plaque. No significant stenosis within the right ICA mild stenosis within the cavernous left ICA. There is abrupt occlusion of the M1 right middle cerebral artery shortly beyond its origin. No significant reconstitution of the M1 right MCA more distally. There is minimal reconstitution of more distal right MCA arteries. The M1 left middle cerebral artery is patent without significant stenosis. No left M2 proximal branch occlusion or high-grade proximal stenosis is demonstrated. The bilateral anterior cerebral arteries are patent without high-grade proximal stenosis. No intracranial aneurysm is identified. Posterior circulation: The intracranial right vertebral artery is patent with mild/moderate stenosis in the distal V4 segment. Marked paucity  of enhancement within the distal intracranial left vertebral artery just proximal to the vertebrobasilar junction which may reflect high-grade, near occlusive stenosis or focal occlusion. The basilar artery is patent with mild atherosclerotic irregularity. Fetal origin of the left posterior  cerebral artery. The bilateral posterior cerebral arteries are patent. Sites of moderate to moderately severe stenosis within the mid P2 posterior cerebral arteries bilaterally. A right posterior communicating artery is not definitively identified and may be hypoplastic or absent. Venous sinuses: Within limitations of contrast timing, no convincing thrombus. Anatomic variants: As described Review of the MIP images confirms the above findings CT Brain Perfusion Findings: CBF (<30%) Volume: Perfusion (Tmax>6.0s) volume: Mismatch Volume: 17mL Infarction Location:Right MCA vascular territory Findings of M1 right MCA proximal occlusion and extensive ischemic infarction change throughout the right MCA vascular territory were called by telephone at the time of interpretation on 11-16-2019 at 12:38 pm to provider [facility] , who verbally acknowledged these results. IMPRESSION: CT head: 1. Extensive abnormal hypodensity throughout much of the right MCA vascular territory consistent with acute/early subacute ischemic infarction. No evidence of hemorrhagic conversion. No significant mass effect at this time. 2. Background chronic ischemic changes as detailed 3. Mild generalized parenchymal atrophy. 4. Air-fluid level and frothy secretions within the left maxillary sinus. Correlate for acute sinusitis. CTA neck: 1. The bilateral common carotid, internal carotid and vertebral arteries are patent within the neck without significant stenosis (50% or greater). 2. Mixed plaque within the carotid bifurcations and proximal internal carotid arteries bilaterally. There is possible ulcerated plaque within the right carotid bulb. There is lobular dilation of the bilateral carotid bulbs. Additionally, there is significant tortuosity of the proximal left ICA. 3. 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 Please correlate with findings on concurrently performed CTA  chest/abdomen/pelvis. 4. 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 5. 1.8 cm nodule within the right thyroid lobe. Nonemergent dedicated thyroid ultrasound is recommended for further evaluation, as clinically appropriate. CTA head: 1. 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 No significant reconstitution of the right M1 MCA more distally. There is minimal reconstitution of right M2 and more distal right MCA branches. 2. Posterior circulation atherosclerotic stenoses. Most notably, there is focal near occlusive stenosis versus focal occlusion of the distal left vertebral artery just proximal to the vertebrobasilar junction. Moderate to moderately severe stenoses within the mid P2 posterior cerebral arteries bilaterally. 3. Mild atherosclerotic plaque within the intracranial internal carotid arteries. CT perfusion head: The perfusion software identifies a 122 mL core infarct within the right MCA vascular territory. The perfusion software identifies a 139 mL region of critically hypoperfused parenchyma within the right MCA vascular territory. Reported mismatch volume 17 mL. Electronically Signed   By: Jackey Loge DO   On: 11/16/2019 13:05   CT C-SPINE NO CHARGE  Result Date: 2019/11/16 CLINICAL DATA:  Found down EXAM: CT CERVICAL SPINE WITHOUT CONTRAST TECHNIQUE: Multidetector CT imaging of the cervical spine was performed without intravenous contrast. Multiplanar CT image reconstructions were also generated. COMPARISON:  None. FINDINGS: Alignment: Normal. Skull base and vertebrae: No acute fracture. No primary bone lesion or focal pathologic process. Soft tissues and spinal canal: No prevertebral fluid or swelling. No visible canal hematoma. Disc levels: Intervertebral disc height loss and degenerative endplate changes most pronounced at C6-7. Multilevel facet arthropathy with advanced bilateral foraminal narrowing at C6-7 and right foraminal narrowing at C5-6.  Upper chest: Centrilobular emphysema. Thoracic aortic aneurysm. Other:  1.8 cm right thyroid lobe nodule. IMPRESSION: 1. No acute fracture or dislocation of the cervical spine. 2. Multilevel degenerative disc disease and facet arthropathy. 3. Thoracic aortic aneurysm. Please refer to forthcoming CT angiogram report for full evaluation of the vascular findings. 4. 1.8 cm right thyroid lobe nodule. Dedicated thyroid ultrasound is recommended on a nonemergent basis. 5. Centrilobular emphysema. Emphysema (ICD10-J43.9). Electronically Signed   By: Duanne GuessNicholas  Plundo D.O.   On: 11/10/2019 12:40   CT Code Stroke Cerebral Perfusion with contrast  Result Date: 11/23/2019 CLINICAL DATA:  Focal neuro deficit, greater than 6 hours, stroke suspected. Additional history provided: Patient brought in by ambulance following well check, patient found on floor by bed with expressive aphasia, facial droop and left-sided weakness. EXAM: CT ANGIOGRAPHY HEAD AND NECK CT PERFUSION BRAIN TECHNIQUE: Multidetector CT imaging of the head and neck was performed using the standard protocol during bolus administration of intravenous contrast. Multiplanar CT image reconstructions and MIPs were obtained to evaluate the vascular anatomy. Carotid stenosis measurements (when applicable) are obtained utilizing NASCET criteria, using the distal internal carotid diameter as the denominator. Multiphase CT imaging of the brain was performed following IV bolus contrast injection. Subsequent parametric perfusion maps were calculated using RAPID software. CONTRAST:  150mL OMNIPAQUE IOHEXOL 350 MG/ML SOLN COMPARISON:  Brain MRI 07/08/2009 FINDINGS: CT HEAD FINDINGS Brain: There is extensive abnormal cortical/subcortical hypodensity throughout a large portion of the right MCA vascular territory, most notably involving the posterolateral right frontal lobe, right parietal lobe, right temporal lobe as well as right insular cortex and subinsular region. Findings  are consistent with acute/early subacute infarct. No evidence of hemorrhagic conversion. No significant mass effect at this time. No midline shift or extra-axial fluid collection. 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 A known now chronic infarct within the left aspect of the midbrain was better appreciated on prior MRI 07/08/2009. Additional ill-defined hypoattenuation within the cerebral white matter is nonspecific, but consistent with chronic small vessel ischemic disease. Multiple chronic lacunar infarcts within the bilateral basal ganglia and possibly within the right thalamus. Mild generalized parenchymal atrophy. Vascular: Reported separate Skull: Normal. Negative for fracture or focal lesion. Sinuses/Orbits: Visualized orbits demonstrate no acute abnormality. Air-fluid level and frothy secretions within the left maxillary sinus. 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 Please correlate with concurrently performed CTA chest/abdomen/pelvis for further description. Mixed plaque within the visualized aortic arch and proximal major branch vessels of the neck. No innominate or proximal subclavian artery stenosis. 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 Right carotid system: CCA and ICA patent within the neck. Mixed plaque within the carotid bifurcation and proximal ICA with possible ulcerated plaque. Somewhat lobular dilation of the carotid bulb. No significant stenosis of the proximal ICA as compared to the more distal vessel. Left carotid system: CCA and ICA patent within the neck. Mixed plaque within the distal common carotid artery and proximal ICA. No significant stenosis of the proximal ICA as compared to the more distal vessel within the neck. There is irregular lobular  dilation of the carotid bulb. There is also prominent tortuosity of the proximal cervical ICA. Vertebral arteries: The vertebral arteries are patent within the neck bilaterally without stenosis. The right vertebral artery is slightly dominant. Skeleton: No acute bony abnormality. Cervical spondylosis with multilevel posterior disc osteophytes, uncovertebral  and facet hypertrophy. Other neck: No cervical lymphadenopathy. 18 mm nodule within the right thyroid lobe. Upper chest: Please refer to concurrently performed CTA of the chest for description of intrathoracic findings Review of the MIP images confirms the above findings CTA HEAD FINDINGS Anterior circulation: Intracranial internal carotid arteries are patent bilaterally with scattered mixed plaque. No significant stenosis within the right ICA mild stenosis within the cavernous left ICA. There is abrupt occlusion of the M1 right middle cerebral artery shortly beyond its origin. No significant reconstitution of the M1 right MCA more distally. There is minimal reconstitution of more distal right MCA arteries. The M1 left middle cerebral artery is patent without significant stenosis. No left M2 proximal branch occlusion or high-grade proximal stenosis is demonstrated. The bilateral anterior cerebral arteries are patent without high-grade proximal stenosis. No intracranial aneurysm is identified. Posterior circulation: The intracranial right vertebral artery is patent with mild/moderate stenosis in the distal V4 segment. Marked paucity of enhancement within the distal intracranial left vertebral artery just proximal to the vertebrobasilar junction which may reflect high-grade, near occlusive stenosis or focal occlusion. The basilar artery is patent with mild atherosclerotic irregularity. Fetal origin of the left posterior cerebral artery. The bilateral posterior cerebral arteries are patent. Sites of moderate to moderately severe stenosis within the mid P2 posterior  cerebral arteries bilaterally. A right posterior communicating artery is not definitively identified and may be hypoplastic or absent. Venous sinuses: Within limitations of contrast timing, no convincing thrombus. Anatomic variants: As described Review of the MIP images confirms the above findings CT Brain Perfusion Findings: CBF (<30%) Volume: Perfusion (Tmax>6.0s) volume: Mismatch Volume: 22mL Infarction Location:Right MCA vascular territory Findings of M1 right MCA proximal occlusion and extensive ischemic infarction change throughout the right MCA vascular territory were called by telephone at the time of interpretation on 12/08/2019 at 12:38 pm to provider [facility] , who verbally acknowledged these results. IMPRESSION: CT head: 1. Extensive abnormal hypodensity throughout much of the right MCA vascular territory consistent with acute/early subacute ischemic infarction. No evidence of hemorrhagic conversion. No significant mass effect at this time. 2. Background chronic ischemic changes as detailed 3. Mild generalized parenchymal atrophy. 4. Air-fluid level and frothy secretions within the left maxillary sinus. Correlate for acute sinusitis. CTA neck: 1. The bilateral common carotid, internal carotid and vertebral arteries are patent within the neck without significant stenosis (50% or greater). 2. Mixed plaque within the carotid bifurcations and proximal internal carotid arteries bilaterally. There is possible ulcerated plaque within the right carotid bulb. There is lobular dilation of the bilateral carotid bulbs. Additionally, there is significant tortuosity of the proximal left ICA. 3. 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 Please correlate with findings on concurrently performed CTA chest/abdomen/pelvis. 4. 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 5. 1.8 cm nodule within the right thyroid lobe. Nonemergent  dedicated thyroid ultrasound is recommended for further evaluation, as clinically appropriate. CTA head: 1. 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 No significant reconstitution of the right M1 MCA more distally. There is minimal reconstitution of right M2 and more distal right MCA branches. 2. Posterior circulation atherosclerotic stenoses. Most notably, there is focal near occlusive stenosis versus focal occlusion of the distal left vertebral artery just proximal to the vertebrobasilar junction. Moderate to moderately severe stenoses within the mid P2 posterior cerebral arteries bilaterally. 3. Mild atherosclerotic plaque within the intracranial internal carotid arteries. CT perfusion head: The perfusion software  identifies a 122 mL core infarct within the right MCA vascular territory. The perfusion software identifies a 139 mL region of critically hypoperfused parenchyma within the right MCA vascular territory. Reported mismatch volume 17 mL. Electronically Signed   By: Jackey Loge DO   On: 11/03/2019 13:05   DG Chest Port 1 View  Result Date: 11/15/2019 CLINICAL DATA:  Altered mental status EXAM: PORTABLE CHEST 1 VIEW COMPARISON:  None. FINDINGS: Patient rotated. Lungs are clear. Heart size and pulmonary vascularity are normal. No adenopathy. There is prominence of the descending thoracic aorta with questionable aneurysmal dilatation in the mid to distal descending aorta. No adenopathy. IMPRESSION: 1.  Lungs clear. 2. Prominence of the descending aorta with questionable degree of aneurysmal dilatation distally. Electronically Signed   By: Bretta Bang III M.D.   On: 11/16/2019 10:44   CT Angio Chest/Abd/Pel for Dissection W and/or W/WO  Result Date: 11/12/2019 CLINICAL DATA:  Chest and abdominal pain. EXAM: CT ANGIOGRAPHY CHEST, ABDOMEN AND PELVIS TECHNIQUE: Multidetector CT imaging through the chest, abdomen and pelvis was performed using the standard protocol during bolus  administration of intravenous contrast. Multiplanar reconstructed images and MIPs were obtained and reviewed to evaluate the vascular anatomy. CONTRAST:  OMNIPAQUE IOHEXOL 350 MG/ML SOLN COMPARISON:  Chest x-ray 11/18/2019 FINDINGS: CTA CHEST FINDINGS Cardiovascular: The heart is normal in size. No pericardial effusion. There is fusiform aneurysmal dilatation of the descending thoracic aorta beginning at the posterior arch. Maximum transverse diameter at the posterior arches. Maximum diameter distally just above the aortic hiatus is 4.4 cm with significant mural thrombus. No dissection or aortic rupture is identified. No intramural hematoma. The aortic branch vessels are patent. Three-vessel coronary artery calcifications are noted. Mediastinum/Nodes: No mediastinal or hilar mass or adenopathy. The esophagus is grossly normal. Lungs/Pleura: Emphysematous changes and pulmonary scarring. No worrisome pulmonary lesions or acute pulmonary findings. Musculoskeletal: No chest wall mass, breast masses, supraclavicular or axillary adenopathy. There is a 17 mm right thyroid nodule which will require further evaluation with ultrasound and potentially need to be biopsied. Other smaller thyroid nodules are noted bilaterally. Review of the MIP images confirms the above findings. CTA ABDOMEN AND PELVIS FINDINGS VASCULAR Aorta: Tortuosity, ectasia and calcification of the abdominal aorta. Maximum diameter is 4.8 cm. There is extensive mural thrombus and areas of mural thrombus calcification. No dissection. Distally, just above the iliac artery bifurcation the aorta is occluded. Just above the occlusion there are large lumbar collaterals which help reconstitute both common iliac arteries. Celiac: Normal SMA: Normal Renals: Right renal artery calcifications but no significant stenosis. The left renal artery is patent. IMA: Patent. Inflow: Distal aortic and proximal common iliac artery occlusions with reconstitution of both  iliac arteries just above the bifurcations. Moderate atherosclerotic calcifications. Veins: Grossly normal. Review of the MIP images confirms the above findings. NON-VASCULAR Hepatobiliary: No focal hepatic lesions are identified. The liver contour is slightly irregular, the hepatic fissures are dilated and the caudate lobe is enlarged all suggesting changes of cirrhosis. Recanalized umbilical vein. Pancreas: No mass, inflammation or ductal dilatation. Spleen: Normal size. No focal lesions. Adrenals/Urinary Tract: The adrenal glands and kidneys are unremarkable. The bladder appears normal. Stomach/Bowel: The stomach, duodenum, small bowel and colon are grossly normal without oral contrast. The terminal ileum is normal. The appendix is normal. Scattered colonic diverticulosis but no findings for acute diverticulitis. Lymphatic: No abdominal or pelvic lymphadenopathy. Reproductive: The uterus and ovaries are normal. Other: No pelvic mass or pelvic adenopathy. No free pelvic fluid  collections. Musculoskeletal: No significant bony findings. Review of the MIP images confirms the above findings. IMPRESSION: 1. 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 No dissection or intramural hematoma. 2. 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 Recommend followup by abdomen and pelvis CTA in 6 months, and vascular surgery referral/consultation if not already obtained. This recommendation follows ACR consensus guidelines: White Paper of the ACR Incidental Findings Committee II on Vascular Findings. J Am Coll Radiol 2013; 10:789-794. Aortic aneurysm NOS (ICD10-I71.9) 3. 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 4. Cirrhotic changes involving the liver. No worrisome hepatic lesions. 5. Emphysematous changes and pulmonary scarring but no acute pulmonary  findings. 6. 17 mm right thyroid nodule. Recommend thyroid US and potential biopsy (ref: J Am Coll Radiol. 2015 Feb;12(2): 143-50). Aortic Atherosclerosis (ICD10-I70.0) and Emphysema (ICD10-J43.9). Electronically Signed   By: Rudie Meyer M.D.   On: 11/13/2019 12:56    Procedures Procedures   (including critical care time)  Medications Ordered in ED Medications   stroke: mapping our early stages of recovery book (has no administration in time range)  0.9 %  sodium chloride infusion (has no administration in time range)  acetaminophen (TYLENOL) tablet 650 mg (has no administration in time range)    Or  acetaminophen (TYLENOL) 160 MG/5ML solution 650 mg (has no administration in time range)    Or  acetaminophen (TYLENOL) suppository 650 mg (has no administration in time range)  aspirin suppository 300 mg (has no administration in time range)    Or  aspirin tablet 325 mg (has no administration in time range)  sodium chloride (hypertonic) 3 % solution (has no administration in time range)  venlafaxine (EFFEXOR) tablet 75 mg (has no administration in time range)  sodium chloride 0.9 % bolus 1,000 mL (1,000 mLs Intravenous New Bag/Given 11/05/2019 1211)  iohexol (OMNIPAQUE) 350 MG/ML injection 150 mL (150 mLs Intravenous Contrast Given 11/20/2019 1218)    ED Course  I have reviewed the triage vital signs and the nursing notes.  Pertinent labs & imaging results that were available during my care of the patient were reviewed by me and considered in my medical decision making (see chart for details).    MDM Rules/Calculators/A&P                      Patient presents to the ED via EMS with stroke like sxs last known normal 0700 yesterday. On arrival patient is aphasic, has L sided facial droop, & LUE/LLE weakness. < 24 hours from last known normal therefore no code stroke activated. Spoke with neurologist Dr. Amada Jupiter shortly after patient arrival who has ordered CTAs & CT perfusion study. CXR  came back with clear lungs & prominence of the descending aorta with questionable degree of aneurysmal dilatation distally--> will also add on CTA chest/abdomen/pelvis to evaluation for dissection.   Labs reviewed & compared to prior. Mild leukocytosis. Mild hypokalemia.  CT chest/abdomen/pelvis: aneurysms of the descending thoracic aorta & the abdominal aorta as detailed above. No dissection. Additional findings as above.  Head/neck imaging notable for R MCA territory stroke   13:00: CONSULT: Discussed with neurologist Dr. Amada Jupiter who will admit to ICU.   Called & spoke with patient's niece via telephone to update her on results & plan of care.  Final Clinical Impression(s) / ED Diagnoses Final diagnoses:  Stroke (HCC)  Stroke (cerebrum) [facility])    Rx / DC Orders ED Discharge Orders    None       Cherly Anderson, PA-C 11/20/2019 1518    Sabas Sous, MD 11/17/19 1558

## 2019-11-14 NOTE — ED Notes (Signed)
Pt transported to xray 

## 2019-11-15 ENCOUNTER — Inpatient Hospital Stay (HOSPITAL_COMMUNITY): Payer: Self-pay

## 2019-11-15 LAB — GLUCOSE, CAPILLARY
Glucose-Capillary: 144 mg/dL — ABNORMAL HIGH (ref 70–99)
Glucose-Capillary: 139 mg/dL — ABNORMAL HIGH (ref 70–99)
Glucose-Capillary: 152 mg/dL — ABNORMAL HIGH (ref 70–99)

## 2019-11-15 LAB — LIPID PANEL
Cholesterol: 125 mg/dL (ref 0–200)
HDL: 25 mg/dL — ABNORMAL LOW (ref >40)
LDL Cholesterol: 70 mg/dL (ref 0–99)
Total CHOL/HDL Ratio: 5 RATIO
Triglycerides: 151 mg/dL — ABNORMAL HIGH (ref <150)
VLDL: 30 mg/dL (ref 0–40)

## 2019-11-15 LAB — BASIC METABOLIC PANEL
Anion gap: 11 (ref 5–15)
BUN: 12 mg/dL (ref 8–23)
CO2: 20 mmol/L — ABNORMAL LOW (ref 22–32)
Calcium: 7.9 mg/dL — ABNORMAL LOW (ref 8.9–10.3)
Chloride: 116 mmol/L — ABNORMAL HIGH (ref 98–111)
Creatinine, Ser: 0.8 mg/dL (ref 0.44–1.00)
GFR calc Af Amer: >60 mL/min (ref >60)
GFR calc non Af Amer: >60 mL/min (ref >60)
Glucose, Bld: 204 mg/dL — ABNORMAL HIGH (ref 70–99)
Potassium: 3.6 mmol/L (ref 3.5–5.1)
Sodium: 147 mmol/L — ABNORMAL HIGH (ref 135–145)

## 2019-11-15 LAB — SODIUM
Sodium: 152 mmol/L — ABNORMAL HIGH (ref 135–145)
Sodium: 155 mmol/L — ABNORMAL HIGH (ref 135–145)

## 2019-11-15 LAB — PHOSPHORUS: Phosphorus: 2.9 mg/dL (ref 2.5–4.6)

## 2019-11-15 LAB — MAGNESIUM
Magnesium: 2 mg/dL (ref 1.7–2.4)
Magnesium: 2.3 mg/dL (ref 1.7–2.4)

## 2019-11-15 LAB — HEMOGLOBIN A1C
Hgb A1c MFr Bld: 5 % (ref 4.8–5.6)
Mean Plasma Glucose: 96.8 mg/dL

## 2019-11-15 MED ORDER — FENTANYL CITRATE (PF) 100 MCG/2ML IJ SOLN
50.0000 ug | INTRAMUSCULAR | Status: DC | PRN
  Administered 2019-11-15 – 2019-11-25 (×11): 50 ug via INTRAVENOUS
  Filled 2019-11-15 (×8): qty 2

## 2019-11-15 MED ORDER — VITAL AF 1.2 CAL PO LIQD
1000.0000 mL | ORAL | Status: DC
  Administered 2019-11-15 – 2019-11-20 (×4): 1000 mL
  Filled 2019-11-15: qty 1000

## 2019-11-15 MED ORDER — VENLAFAXINE HCL 50 MG PO TABS
75.0000 mg | ORAL_TABLET | Freq: Two times a day (BID) | ORAL | Status: DC
  Administered 2019-11-15 – 2019-11-25 (×20): 75 mg
  Filled 2019-11-15 (×19): qty 2

## 2019-11-15 MED ORDER — POTASSIUM CHLORIDE 10 MEQ/100ML IV SOLN
10.0000 meq | INTRAVENOUS | Status: DC
  Administered 2019-11-15: 10 meq via INTRAVENOUS
  Filled 2019-11-15 (×3): qty 100

## 2019-11-15 MED ORDER — POTASSIUM CHLORIDE 10 MEQ/100ML IV SOLN
10.0000 meq | INTRAVENOUS | Status: AC
  Administered 2019-11-15 (×6): 10 meq via INTRAVENOUS
  Filled 2019-11-15 (×4): qty 100

## 2019-11-15 MED ORDER — SODIUM CHLORIDE 3 % IV SOLN
50.0000 mL/h | INTRAVENOUS | Status: DC
  Administered 2019-11-15: 50 mL/h via INTRAVENOUS
  Filled 2019-11-15 (×2): qty 500

## 2019-11-15 MED ORDER — SODIUM CHLORIDE 0.9 % IV SOLN: 250.0000 mL | INTRAVENOUS | Status: DC

## 2019-11-15 MED ORDER — NOREPINEPHRINE 4 MG/250ML-% IV SOLN
2.0000 ug/min | INTRAVENOUS | Status: DC
  Administered 2019-11-15: 2 ug/min via INTRAVENOUS
  Administered 2019-11-16: 4 ug/min via INTRAVENOUS
  Filled 2019-11-15 (×3): qty 250

## 2019-11-15 MED ORDER — ASPIRIN EC 325 MG PO TBEC
325.0000 mg | DELAYED_RELEASE_TABLET | Freq: Every day | ORAL | Status: DC
  Administered 2019-11-19 – 2019-11-21 (×2): 325 mg via ORAL
  Filled 2019-11-15 (×6): qty 1

## 2019-11-15 MED ORDER — PRO-STAT SUGAR FREE PO LIQD
30.0000 mL | Freq: Three times a day (TID) | ORAL | Status: DC
  Administered 2019-11-15 – 2019-11-21 (×18): 30 mL
  Filled 2019-11-15 (×17): qty 30

## 2019-11-15 MED ORDER — ASPIRIN 300 MG RE SUPP
300.0000 mg | Freq: Every day | RECTAL | Status: DC
  Administered 2019-11-15 – 2019-11-24 (×8): 300 mg via RECTAL
  Filled 2019-11-15 (×6): qty 1

## 2019-11-15 MED ORDER — SODIUM CHLORIDE 3 % IV SOLN
INTRAVENOUS | Status: DC
  Administered 2019-11-15 – 2019-11-16 (×3): 40 mL/h via INTRAVENOUS
  Filled 2019-11-15 (×3): qty 500

## 2019-11-15 MED ORDER — SODIUM CHLORIDE 0.9% FLUSH: 10.0000 mL | INTRAVENOUS | Status: DC | PRN

## 2019-11-15 MED ORDER — SODIUM CHLORIDE 0.9% FLUSH
10.0000 mL | Freq: Two times a day (BID) | INTRAVENOUS | Status: DC
  Administered 2019-11-15 – 2019-11-24 (×18): 10 mL
  Administered 2019-11-25: 20 mL
  Administered 2019-11-26: 08:00:00 10 mL

## 2019-11-15 NOTE — Progress Notes (Signed)
PT Cancellation Note  Patient Details Name: Paula Kim MRN: [id number] DOB: 03-02-1956   Cancelled Treatment:    Reason Eval/Treat Not Completed: Active bedrest order Pt currently on bedrest. Will await increase in activity orders prior to PT evaluation. Will follow.   Blake Divine A Aubriee Szeto 11/15/2019, 6:47 AM [place], PT, DPT Acute Rehabilitation Services Pager [phone] Office [phone]

## 2019-11-15 NOTE — Op Note (Signed)
12-05-2019  4:17 PM  PATIENT:  Sheryle Vice  64 y.o. female with a Right middle cerebral artery infarct. The neurology service has recommended a hemicraniectomy prior to impending herniation. I have spoken with the patient and her niece. They would like to proceed with the procedure.   PRE-OPERATIVE DIAGNOSIS:  middle cerebral artery infarct right POST-OPERATIVE DIAGNOSIS:  middle cerebral artery infarct  PROCEDURE:  Procedure(s): Right HEMICRANIECTOMY for cerebral decompression  SURGEON: Surgeon(s): Coletta Memos, MD  ASSISTANTS:none  ANESTHESIA:   general  EBL:  Total I/O In: 2024 [I.V.:1163.7; IV Piggyback:860.3] Out: 425 [Urine:425]  BLOOD ADMINISTERED:none  CELL SAVER GIVEN:none  COUNT:per nursing  DRAINS: none   SPECIMEN:  No Specimen  DICTATION: Kmari Brian was taken to the operating room, intubated, and placed under a general anesthetic without difficulty. She  was positioned supine with her head turned towards the left and placed on a horseshe. Her head was shaved, was prepped and was draped in a sterile manner. I made a reverse question mark incision on the right side, and retracted the scalp flap rostrally. I then divided the temporalis muscle and reflected it with the scalp with skin hooks. I drilled burr holes to encompass a frontal, parietal, temporal craniotomy. I connected the burr holes with the craniotome and removed the bone flap. I opened the dura in a cruciate fashion. Along the medial margin of the cranial exposure I encountered bleeding, which was venous and controlled with surgifoam and pressure. I placed a large piece of duragen over the brain surface. I closed the scalp with vicryl sutures in the galea, and staples for the scalp edges.  I next went to the abdomen which had been prepared in a sterile fashion. I made a horizontal incision and developed a pocket in the abdominal wall to accommodate the bone flap. I placed the skull flap in the pocket. I  closed the incision with vicryl sutures and staples. I applied sterile dressing on both incisions.  Mrs. Culbreth was taken intubated directly to the Neuro ICU.  PLAN OF CARE: Admit to inpatient   PATIENT DISPOSITION:  ICU - intubated and critically ill.   Delay start of Pharmacological VTE agent (>24hrs) due to surgical blood loss or risk of bleeding:  yes

## 2019-11-15 NOTE — Progress Notes (Signed)
Advanced ogt to 54 cm from lip per recommendation of radiology report. Was previously 65 cm at lip

## 2019-11-15 NOTE — Progress Notes (Signed)
Initial Nutrition Assessment  DOCUMENTATION CODES:   Not applicable  INTERVENTION:   Initiate Vital AF 1.2 @ 40 ml/hr (960 ml/day) via OG tube 30 ml Prostat TID  Provides: 1452 kcal, 117 grams protein, and 778 ml free water.    NUTRITION DIAGNOSIS:   Inadequate oral intake related to inability to eat as evidenced by NPO status.  GOAL:   Patient will meet greater than or equal to 90% of their needs  MONITOR:   TF tolerance, Vent status  REASON FOR ASSESSMENT:   Ventilator, Rounds    ASSESSMENT:   Pt with PMH of CVA in 2009, HLD, SOB, and depression who is now admitted with large R MCA infarct s/p crani 2/18.   Pt discussed during ICU rounds and with RN.  Spoke with CCM who would like to start enteral nutrition. Swelling preventing extubation at this time.   Patient is currently intubated on ventilator support MV: 9.9 L/min Temp (24hrs), Avg:98.9 F (37.2 C), Min:98.8 F (37.1 C), Max:99 F (37.2 C)   Medications reviewed and include: hypertonic saline  Labs reviewed: Na 147 (H)    NUTRITION - FOCUSED PHYSICAL EXAM:    Most Recent Value  Orbital Region  No depletion  Upper Arm Region  No depletion  Thoracic and Lumbar Region  No depletion  Buccal Region  Unable to assess  Temple Region  No depletion  Clavicle Bone Region  No depletion  Clavicle and Acromion Bone Region  No depletion  Scapular Bone Region  No depletion  Dorsal Hand  No depletion  Patellar Region  Moderate depletion [L severe]  Anterior Thigh Region  Moderate depletion [L severe]  Posterior Calf Region  Moderate depletion [L severe]  Edema (RD Assessment)  -- [facial edema]  Hair  Reviewed  Eyes  Reviewed  Mouth  Unable to assess  Skin  Reviewed  Nails  Reviewed       Diet Order:   Diet Order            Diet NPO time specified  Diet effective now              EDUCATION NEEDS:   Not appropriate for education at this time  Skin:  Skin Assessment: Reviewed RN  Assessment  Last BM:  unknown  Height:   Ht Readings from Last 1 Encounters:  14-Dec-2019 5\' 1"  (1.549 m)    Weight:   Wt Readings from Last 1 Encounters:  2019-12-14 67 kg    Ideal Body Weight:  47.7 kg  BMI:  Body mass index is 27.91 kg/m.  Estimated Nutritional Needs:   Kcal:  1427  Protein:  100-115 grams  Fluid:  >1.5 L/day  11/16/19., RD, LDN, CNSC See AMiON for contact information

## 2019-11-15 NOTE — Progress Notes (Signed)
Pt transported to and from CT scan on the ventilator without incident. 

## 2019-11-15 NOTE — Progress Notes (Signed)
SLP Cancellation Note  Patient Details Name: Paula Kim MRN: [id number] DOB: Jan 30, 1956   Cancelled treatment:       Reason Eval/Treat Not Completed: Patient not medically ready (on vent). Will f/u as able.     Mahala Menghini., M.A. CCC-SLP Acute Rehabilitation Services Pager [phone] Office [phone]  11/15/2019, 7:56 AM

## 2019-11-15 NOTE — Progress Notes (Signed)
Patient ID: Paula Kim, female   DOB: 09-Mar-1956, 64 y.o.   MRN: [id number] BP 104/85   Pulse 87   Temp 98 F (36.7 C)   Resp (!) 24   Ht 5\' 1"  (1.549 m)   Wt 67 kg   SpO2 99%   BMI 27.91 kg/m  Lethargic, will follow commands Head CT looks good, basal cisterns widely patent, ventricles not effaced.  Bone flap placed in abdominal wall. Please notify me when discharge is pending. No new recommendations

## 2019-11-15 NOTE — Progress Notes (Signed)
STROKE TEAM PROGRESS NOTE   INTERVAL HISTORY I personally reviewed history of presenting illness, electronic medical records and imaging films in PACS. 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  She remains intubated for respiratory failure but is arousable and follows simple commands on the right side but remains plegic on the left. Vitals:   11/15/19 0600 11/15/19 0700 11/15/19 0752 11/15/19 0800  BP: (!) 115/53 112/60  112/61  Pulse:      Resp: 19 (!) 21  16  Temp:    98.8 F (37.1 C)  TempSrc:    Axillary  SpO2:   100%   Weight:      Height:        CBC:  Recent Labs  Lab 11/23/2019 1200 11/07/2019 2112  WBC 11.1*  --   NEUTROABS 9.5*  --   HGB 13.0 9.5*  HCT 38.0 28.0*  MCV 89.4  --   PLT 259  --     Basic Metabolic Panel:  Recent Labs  Lab 11/13/2019 1030 11/16/2019 1030 10/28/2019 1059 11/02/2019 1650 10/30/2019 2112 11/15/19 0345  NA 140   < > 140   < > 140 147*  K 3.2*   < > 3.0*  --  2.7* 3.6  CL 102   < > 104  --   --  116*  CO2 23  --   --   --   --  20*  GLUCOSE 127*   < > 112*  --   --  204*  BUN 12   < > 12  --   --  12  CREATININE 0.77   < > 0.50  --   --  0.80  CALCIUM 9.5  --   --   --   --  7.9*  MG  --   --   --   --   --  2.0   < > = values in this interval not displayed.   Lipid Panel:     Component Value Date/Time   CHOL 125 11/15/2019 0345   TRIG 151 (H) 11/15/2019 0345   HDL 25 (L) 11/15/2019 0345   CHOLHDL 5.0 11/15/2019 0345   VLDL 30 11/15/2019 0345   LDLCALC 70 11/15/2019 0345   HgbA1c:  Lab Results  Component Value Date   HGBA1C 5.0 11/15/2019   Urine Drug Screen:     Component Value Date/Time   LABOPIA NONE DETECTED 11/11/2019 1315   COCAINSCRNUR NONE DETECTED 10/29/2019 1315   LABBENZ NONE DETECTED 11/04/2019 1315   AMPHETMU NONE DETECTED 11/22/2019 1315   THCU POSITIVE (A) 11/05/2019 1315   LABBARB NONE  DETECTED 11/21/2019 1315    Alcohol Level     Component Value Date/Time   ETH <10 11/07/2019 1030    IMAGING past 48 hours CT Code Stroke CTA Head W/WO contrast  Addendum Date: 10/30/2019   ADDENDUM REPORT: 11/06/2019 18:07 ADDENDUM: The presence of a thoracic aortic aneurysm, lobular dilation of both carotid bulbs and 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 Addendum called by telephone on 02/09/2021at 6:06 pmto provider Dr. Wilford Corner, Who verbally acknowledged these results. Electronically Signed   By: Jackey Loge DO   On: 11/15/2019 18:07   Result Date: 11/13/2019 CLINICAL DATA:  Focal neuro deficit, greater than 6 hours, stroke suspected. Additional history provided: Patient brought in by ambulance following  well check, patient found on floor by bed with expressive aphasia, facial droop and left-sided weakness. EXAM: CT ANGIOGRAPHY HEAD AND NECK CT PERFUSION BRAIN TECHNIQUE: Multidetector CT imaging of the head and neck was performed using the standard protocol during bolus administration of intravenous contrast. Multiplanar CT image reconstructions and MIPs were obtained to evaluate the vascular anatomy. Carotid stenosis measurements (when applicable) are obtained utilizing NASCET criteria, using the distal internal carotid diameter as the denominator. Multiphase CT imaging of the brain was performed following IV bolus contrast injection. Subsequent parametric perfusion maps were calculated using RAPID software. CONTRAST:  150mL OMNIPAQUE IOHEXOL 350 MG/ML SOLN COMPARISON:  Brain MRI 07/08/2009 FINDINGS: CT HEAD FINDINGS Brain: There is extensive abnormal cortical/subcortical hypodensity throughout a large portion of the right MCA vascular territory, most notably involving the posterolateral right frontal lobe, right parietal lobe, right temporal lobe as well as right insular cortex and subinsular region. Findings are  consistent with acute/early subacute infarct. No evidence of hemorrhagic conversion. No significant mass effect at this time. No midline shift or extra-axial fluid collection. 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 A known now chronic infarct within the left aspect of the midbrain was better appreciated on prior MRI 07/08/2009. Additional ill-defined hypoattenuation within the cerebral white matter is nonspecific, but consistent with chronic small vessel ischemic disease. Multiple chronic lacunar infarcts within the bilateral basal ganglia and possibly within the right thalamus. Mild generalized parenchymal atrophy. Vascular: Reported separate Skull: Normal. Negative for fracture or focal lesion. Sinuses/Orbits: Visualized orbits demonstrate no acute abnormality. Air-fluid level and frothy secretions within the left maxillary sinus. 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 Please correlate with concurrently performed CTA chest/abdomen/pelvis for further description. Mixed plaque within the visualized aortic arch and proximal major branch vessels of the neck. No innominate or proximal subclavian artery stenosis. 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 Right carotid system: CCA and ICA patent within the neck. Mixed plaque within the carotid bifurcation and proximal ICA with possible ulcerated plaque. Somewhat lobular dilation of the carotid bulb. No significant stenosis of the proximal ICA as compared to the more distal vessel. Left carotid system: CCA and ICA patent within the neck. Mixed plaque within the distal common carotid artery and proximal ICA. No significant stenosis of the proximal ICA as compared to the more distal vessel within the neck. There is irregular lobular dilation  of the carotid bulb. There is also prominent tortuosity of the proximal cervical ICA. Vertebral arteries: The vertebral arteries are patent within the neck bilaterally without stenosis. The right vertebral artery is slightly dominant. Skeleton: No acute bony abnormality. Cervical spondylosis with multilevel posterior disc osteophytes, uncovertebral and facet hypertrophy. Other neck: No cervical lymphadenopathy. 18 mm nodule within the right thyroid lobe. Upper chest: Please refer to concurrently performed CTA of the chest for description of intrathoracic findings Review of the MIP images confirms the above findings CTA HEAD FINDINGS Anterior circulation: Intracranial internal carotid arteries are patent bilaterally with scattered mixed plaque. No significant stenosis within the right ICA mild stenosis within the cavernous left ICA. There is abrupt occlusion of the M1 right middle cerebral artery shortly beyond its origin. No significant reconstitution of the M1 right MCA more distally. There is minimal reconstitution of more distal right MCA arteries. The M1 left middle cerebral artery  is patent without significant stenosis. No left M2 proximal branch occlusion or high-grade proximal stenosis is demonstrated. The bilateral anterior cerebral arteries are patent without high-grade proximal stenosis. No intracranial aneurysm is identified. Posterior circulation: The intracranial right vertebral artery is patent with mild/moderate stenosis in the distal V4 segment. Marked paucity of enhancement within the distal intracranial left vertebral artery just proximal to the vertebrobasilar junction which may reflect high-grade, near occlusive stenosis or focal occlusion. The basilar artery is patent with mild atherosclerotic irregularity. Fetal origin of the left posterior cerebral artery. The bilateral posterior cerebral arteries are patent. Sites of moderate to moderately severe stenosis within the mid P2 posterior cerebral  arteries bilaterally. A right posterior communicating artery is not definitively identified and may be hypoplastic or absent. Venous sinuses: Within limitations of contrast timing, no convincing thrombus. Anatomic variants: As described Review of the MIP images confirms the above findings CT Brain Perfusion Findings: CBF (<30%) Volume: Perfusion (Tmax>6.0s) volume: Mismatch Volume: 17mL Infarction Location:Right MCA vascular territory Findings of M1 right MCA proximal occlusion and extensive ischemic infarction change throughout the right MCA vascular territory were called by telephone at the time of interpretation on 11/20/2019 at 12:38 pm to provider [facility] , who verbally acknowledged these results. IMPRESSION: CT head: 1. Extensive abnormal hypodensity throughout much of the right MCA vascular territory consistent with acute/early subacute ischemic infarction. No evidence of hemorrhagic conversion. No significant mass effect at this time. 2. Background chronic ischemic changes as detailed 3. Mild generalized parenchymal atrophy. 4. Air-fluid level and frothy secretions within the left maxillary sinus. Correlate for acute sinusitis. CTA neck: 1. The bilateral common carotid, internal carotid and vertebral arteries are patent within the neck without significant stenosis (50% or greater). 2. Mixed plaque within the carotid bifurcations and proximal internal carotid arteries bilaterally. There is possible ulcerated plaque within the right carotid bulb. There is lobular dilation of the bilateral carotid bulbs. Additionally, there is significant tortuosity of the proximal left ICA. 3. 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 Please correlate with findings on concurrently performed CTA chest/abdomen/pelvis. 4. 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 5. 1.8 cm nodule within the right thyroid lobe. Nonemergent dedicated thyroid  ultrasound is recommended for further evaluation, as clinically appropriate. CTA head: 1. 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 No significant reconstitution of the right M1 MCA more distally. There is minimal reconstitution of right M2 and more distal right MCA branches. 2. Posterior circulation atherosclerotic stenoses. Most notably, there is focal near occlusive stenosis versus focal occlusion of the distal left vertebral artery just proximal to the vertebrobasilar junction. Moderate to moderately severe stenoses within the mid P2 posterior cerebral arteries bilaterally. 3. Mild atherosclerotic plaque within the intracranial internal carotid arteries. CT perfusion head: The perfusion software identifies a 122 mL core infarct within the right MCA vascular territory. The perfusion software identifies a 139 mL region of critically hypoperfused parenchyma within the right MCA vascular territory. Reported mismatch volume 17 mL. Electronically Signed: By: Jackey Loge DO On: 11/07/2019 13:05   DG Chest 2 View  Result Date: 11/13/2019 CLINICAL DATA:  Preoperative exam for aortic stent. EXAM: CHEST - 2 VIEW COMPARISON:  11/06/2019 FINDINGS: Heart size is normal. Atherosclerosis, tortuosity and ectasia of the aorta. The pulmonary vascularity is normal. The lungs are clear. No effusions. Ordinary chronic degenerative changes affect the spine, with a partial compression fracture of a midthoracic vertebral body. IMPRESSION:  No active disease.  Aortic atherosclerosis, tortuosity and ectasia. Electronically Signed   By: Paulina FusiMark  Shogry M.D.   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 Addendum called by  telephone on Dec 10, 2021at 6:06 pmto provider Dr. Wilford CornerArora, Who verbally acknowledged these results. Electronically Signed   By: Jackey LogeKyle  Golden DO   On: 2019-12-11 18:07   Result Date: Sep 04, 2020 CLINICAL DATA:  Focal neuro deficit, greater than 6 hours, stroke suspected. Additional history provided: Patient brought in by ambulance following well check, patient found on floor by bed with expressive aphasia, facial droop and left-sided weakness. EXAM: CT ANGIOGRAPHY HEAD AND NECK CT PERFUSION BRAIN TECHNIQUE: Multidetector CT imaging of the head and neck was performed using the standard protocol during bolus administration of intravenous contrast. Multiplanar CT image reconstructions and MIPs were obtained to evaluate the vascular anatomy. Carotid stenosis measurements (when applicable) are obtained utilizing NASCET criteria, using the distal internal carotid diameter as the denominator. Multiphase CT imaging of the brain was performed following IV bolus contrast injection. Subsequent parametric perfusion maps were calculated using RAPID software. CONTRAST:  150mL OMNIPAQUE IOHEXOL 350 MG/ML SOLN COMPARISON:  Brain MRI 07/08/2009 FINDINGS: CT HEAD FINDINGS Brain: There is extensive abnormal cortical/subcortical hypodensity throughout a large portion of the right MCA vascular territory, most notably involving the posterolateral right frontal lobe, right parietal lobe, right temporal lobe as well as right insular cortex and subinsular region. Findings are consistent with acute/early subacute infarct. No evidence of hemorrhagic conversion. No significant mass effect at this time. No midline shift or extra-axial fluid collection. 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 A known now chronic infarct within the left aspect of the midbrain was better appreciated on prior MRI 07/08/2009. Additional ill-defined hypoattenuation within the cerebral white matter is nonspecific, but consistent with chronic  small vessel ischemic disease. Multiple chronic lacunar infarcts within the bilateral basal ganglia and possibly within the right thalamus. Mild generalized parenchymal atrophy. Vascular: Reported separate Skull: Normal. Negative for fracture or focal lesion. Sinuses/Orbits: Visualized orbits demonstrate no acute abnormality. Air-fluid level and frothy secretions within the left maxillary sinus. 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 Please correlate with concurrently performed CTA chest/abdomen/pelvis for further description. Mixed plaque within the visualized aortic arch and proximal major branch vessels of the neck. No innominate or proximal subclavian artery stenosis. 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 Right carotid system: CCA and ICA patent within the neck. Mixed plaque within the carotid bifurcation and proximal ICA with possible ulcerated plaque. Somewhat lobular dilation of the carotid bulb. No significant stenosis of the proximal ICA as compared to the more distal vessel. Left carotid system: CCA and ICA patent within the neck. Mixed plaque within the distal common carotid artery and proximal ICA. No significant stenosis of the proximal ICA as compared to the more distal vessel within the neck. There is irregular lobular dilation of the carotid bulb. There is also prominent tortuosity of the proximal cervical ICA. Vertebral arteries: The vertebral arteries are patent within the neck bilaterally without stenosis. The right  vertebral artery is slightly dominant. Skeleton: No acute bony abnormality. Cervical spondylosis with multilevel posterior disc osteophytes, uncovertebral and facet hypertrophy. Other neck: No cervical lymphadenopathy. 18 mm nodule within the right thyroid lobe. Upper chest:  Please refer to concurrently performed CTA of the chest for description of intrathoracic findings Review of the MIP images confirms the above findings CTA HEAD FINDINGS Anterior circulation: Intracranial internal carotid arteries are patent bilaterally with scattered mixed plaque. No significant stenosis within the right ICA mild stenosis within the cavernous left ICA. There is abrupt occlusion of the M1 right middle cerebral artery shortly beyond its origin. No significant reconstitution of the M1 right MCA more distally. There is minimal reconstitution of more distal right MCA arteries. The M1 left middle cerebral artery is patent without significant stenosis. No left M2 proximal branch occlusion or high-grade proximal stenosis is demonstrated. The bilateral anterior cerebral arteries are patent without high-grade proximal stenosis. No intracranial aneurysm is identified. Posterior circulation: The intracranial right vertebral artery is patent with mild/moderate stenosis in the distal V4 segment. Marked paucity of enhancement within the distal intracranial left vertebral artery just proximal to the vertebrobasilar junction which may reflect high-grade, near occlusive stenosis or focal occlusion. The basilar artery is patent with mild atherosclerotic irregularity. Fetal origin of the left posterior cerebral artery. The bilateral posterior cerebral arteries are patent. Sites of moderate to moderately severe stenosis within the mid P2 posterior cerebral arteries bilaterally. A right posterior communicating artery is not definitively identified and may be hypoplastic or absent. Venous sinuses: Within limitations of contrast timing, no convincing thrombus. Anatomic variants: As described Review of the MIP images confirms the above findings CT Brain Perfusion Findings: CBF (<30%) Volume: Perfusion (Tmax>6.0s) volume: Mismatch Volume: 73mL Infarction Location:Right MCA vascular territory Findings of M1 right  MCA proximal occlusion and extensive ischemic infarction change throughout the right MCA vascular territory were called by telephone at the time of interpretation on 2019/12/10 at 12:38 pm to provider [facility]ILL [facility] , who verbally acknowledged these results. IMPRESSION: CT head: 1. Extensive abnormal hypodensity throughout much of the right MCA vascular territory consistent with acute/early subacute ischemic infarction. No evidence of hemorrhagic conversion. No significant mass effect at this time. 2. Background chronic ischemic changes as detailed 3. Mild generalized parenchymal atrophy. 4. Air-fluid level and frothy secretions within the left maxillary sinus. Correlate for acute sinusitis. CTA neck: 1. The bilateral common carotid, internal carotid and vertebral arteries are patent within the neck without significant stenosis (50% or greater). 2. Mixed plaque within the carotid bifurcations and proximal internal carotid arteries bilaterally. There is possible ulcerated plaque within the right carotid bulb. There is lobular dilation of the bilateral carotid bulbs. Additionally, there is significant tortuosity of the proximal left ICA. 3. 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 Please correlate with findings on concurrently performed CTA chest/abdomen/pelvis. 4. 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 5. 1.8 cm nodule within the right thyroid lobe. Nonemergent dedicated thyroid ultrasound is recommended for further evaluation, as clinically appropriate. CTA head: 1. 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 No significant reconstitution of the right M1 MCA more distally. There is minimal reconstitution of right M2 and more distal right MCA branches. 2. Posterior circulation atherosclerotic stenoses. Most notably, there is focal near occlusive stenosis versus focal occlusion of the distal left vertebral artery just proximal  to the vertebrobasilar junction. Moderate to moderately severe stenoses within the mid P2 posterior  cerebral arteries bilaterally. 3. Mild atherosclerotic plaque within the intracranial internal carotid arteries. CT perfusion head: The perfusion software identifies a 122 mL core infarct within the right MCA vascular territory. The perfusion software identifies a 139 mL region of critically hypoperfused parenchyma within the right MCA vascular territory. Reported mismatch volume 17 mL. Electronically Signed: By: Jackey Loge DO On: 2019-12-03 13:05   MR BRAIN WO CONTRAST  Result Date: 03-Dec-2019 CLINICAL DATA:  Stroke; stroke, follow-up. EXAM: MRI HEAD WITHOUT CONTRAST TECHNIQUE: Multiplanar, multiecho pulse sequences of the brain and surrounding structures were obtained without intravenous contrast. COMPARISON:  Noncontrast CT head, CT angiogram head/neck and CT perfusion performed earlier the same day 2019-12-03. FINDINGS: Brain: Intermittent motion degradation. Most notably, there is moderate motion degradation of the axial T2 FLAIR sequence and of the axial T1 weighted sequence. There is extensive restricted diffusion consistent with acute infarction throughout much of the right MCA vascular territory. Most notably, there is involvement of the right frontal, parietal and temporal lobes as well as right insula and subinsular region. Corresponding T2/FLAIR hyperintensity. There is mild associated mass effect. No significant effacement of the ventricular system. No midline shift. There is a small amount of T1 hyperintensity within the infarction territory in the right parietal lobe likely reflecting petechial hemorrhage. Redemonstrated small chronic cortically based infarcts within the left parietooccipital lobe. Background moderate/advanced chronic small vessel ischemic disease, progressed since prior MRI 07/08/2009. Multiple chronic lacunar infarcts within the cerebral white matter, basal ganglia, right  thalamus, midbrain and pons. This includes chronic lacunar infarcts within the corona radiata, basal ganglia, right thalamus and pons which were not present on prior MRI. Mild generalized parenchymal atrophy. There is no evidence of intracranial mass. No midline shift or extra-axial fluid collection. No chronic intracranial blood products. Vascular: There is T2* signal loss within the M1 right middle cerebral artery and adjacent proximal M2 branch vessels likely reflecting endoluminal thrombus. Skull and upper cervical spine: No focal marrow lesion. Sinuses/Orbits: Visualized orbits demonstrate no acute abnormality. Air-fluid level and frothy secretions within the left maxillary sinus. Trace fluid within right mastoid air cells. IMPRESSION: 1. Motion degraded exam. 2. Acute ischemic infarction involving much of the right MCA vascular territory. Only mild mass effect at this time. Mild petechial hemorrhage within portions of the right parietal infarction territory. 3. Redemonstrated small chronic cortically based infarcts within the left parietooccipital lobes. 4. Moderate/advanced chronic small vessel ischemic disease with multiple chronic lacunar infarcts, progressed from MRI 07/08/2009. 5. Mild generalized parenchymal atrophy. 6. Air-fluid level and frothy secretions within the left maxillary sinus. Correlate for acute sinusitis. Electronically Signed   By: Jackey Loge DO   On: 12-03-2019 17:27   CT C-SPINE NO CHARGE  Result Date: December 03, 2019 CLINICAL DATA:  Found down EXAM: CT CERVICAL SPINE WITHOUT CONTRAST TECHNIQUE: Multidetector CT imaging of the cervical spine was performed without intravenous contrast. Multiplanar CT image reconstructions were also generated. COMPARISON:  None. FINDINGS: Alignment: Normal. Skull base and vertebrae: No acute fracture. No primary bone lesion or focal pathologic process. Soft tissues and spinal canal: No prevertebral fluid or swelling. No visible canal hematoma. Disc  levels: Intervertebral disc height loss and degenerative endplate changes most pronounced at C6-7. Multilevel facet arthropathy with advanced bilateral foraminal narrowing at C6-7 and right foraminal narrowing at C5-6. Upper chest: Centrilobular emphysema. Thoracic aortic aneurysm. Other: 1.8 cm right thyroid lobe nodule. IMPRESSION: 1. No acute fracture or dislocation of the cervical spine. 2. Multilevel degenerative disc disease and facet arthropathy. 3.  Thoracic aortic aneurysm. Please refer to forthcoming CT angiogram report for full evaluation of the vascular findings. 4. 1.8 cm right thyroid lobe nodule. Dedicated thyroid ultrasound is recommended on a nonemergent basis. 5. Centrilobular emphysema. Emphysema (ICD10-J43.9). Electronically Signed   By: Duanne Guess D.O.   On: 11-19-2019 12:40   CT Code Stroke Cerebral Perfusion with contrast  Addendum Date: Nov 19, 2019   ADDENDUM REPORT: 2019/11/19 18:07 ADDENDUM: The presence of a thoracic aortic aneurysm, lobular dilation of both carotid bulbs and 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 Addendum called by telephone on February 23, 2021at 6:06 pmto provider Dr. Wilford Corner, Who verbally acknowledged these results. Electronically Signed   By: Jackey Loge DO   On: 11/19/19 18:07   Result Date: 2019/11/19 CLINICAL DATA:  Focal neuro deficit, greater than 6 hours, stroke suspected. Additional history provided: Patient brought in by ambulance following well check, patient found on floor by bed with expressive aphasia, facial droop and left-sided weakness. EXAM: CT ANGIOGRAPHY HEAD AND NECK CT PERFUSION BRAIN TECHNIQUE: Multidetector CT imaging of the head and neck was performed using the standard protocol during bolus administration of intravenous contrast. Multiplanar CT image reconstructions and MIPs were obtained to evaluate the vascular anatomy. Carotid stenosis measurements (when  applicable) are obtained utilizing NASCET criteria, using the distal internal carotid diameter as the denominator. Multiphase CT imaging of the brain was performed following IV bolus contrast injection. Subsequent parametric perfusion maps were calculated using RAPID software. CONTRAST:  OMNIPAQUE IOHEXOL 350 MG/ML SOLN COMPARISON:  Brain MRI 07/08/2009 FINDINGS: CT HEAD FINDINGS Brain: There is extensive abnormal cortical/subcortical hypodensity throughout a large portion of the right MCA vascular territory, most notably involving the posterolateral right frontal lobe, right parietal lobe, right temporal lobe as well as right insular cortex and subinsular region. Findings are consistent with acute/early subacute infarct. No evidence of hemorrhagic conversion. No significant mass effect at this time. No midline shift or extra-axial fluid collection. 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 A known now chronic infarct within the left aspect of the midbrain was better appreciated on prior MRI 07/08/2009. Additional ill-defined hypoattenuation within the cerebral white matter is nonspecific, but consistent with chronic small vessel ischemic disease. Multiple chronic lacunar infarcts within the bilateral basal ganglia and possibly within the right thalamus. Mild generalized parenchymal atrophy. Vascular: Reported separate Skull: Normal. Negative for fracture or focal lesion. Sinuses/Orbits: Visualized orbits demonstrate no acute abnormality. Air-fluid level and frothy secretions within the left maxillary sinus. 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 Please correlate with concurrently performed CTA chest/abdomen/pelvis for further description. Mixed plaque within the visualized aortic arch and proximal major branch vessels of the neck. No innominate or proximal  subclavian artery stenosis. 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 Right carotid system: CCA and ICA patent within the neck. Mixed plaque within the carotid bifurcation and proximal ICA with possible ulcerated plaque. Somewhat lobular dilation of the carotid bulb. No significant stenosis of the proximal ICA as compared to the more distal vessel. Left carotid system: CCA and ICA patent within the neck. Mixed plaque within the distal common carotid artery and proximal ICA. No significant stenosis of the proximal ICA as compared to the more distal vessel within the neck. There is irregular lobular dilation  of the carotid bulb. There is also prominent tortuosity of the proximal cervical ICA. Vertebral arteries: The vertebral arteries are patent within the neck bilaterally without stenosis. The right vertebral artery is slightly dominant. Skeleton: No acute bony abnormality. Cervical spondylosis with multilevel posterior disc osteophytes, uncovertebral and facet hypertrophy. Other neck: No cervical lymphadenopathy. 18 mm nodule within the right thyroid lobe. Upper chest: Please refer to concurrently performed CTA of the chest for description of intrathoracic findings Review of the MIP images confirms the above findings CTA HEAD FINDINGS Anterior circulation: Intracranial internal carotid arteries are patent bilaterally with scattered mixed plaque. No significant stenosis within the right ICA mild stenosis within the cavernous left ICA. There is abrupt occlusion of the M1 right middle cerebral artery shortly beyond its origin. No significant reconstitution of the M1 right MCA more distally. There is minimal reconstitution of more distal right MCA arteries. The M1 left middle cerebral artery is patent without significant stenosis. No left M2 proximal branch occlusion or high-grade proximal stenosis is demonstrated. The bilateral anterior  cerebral arteries are patent without high-grade proximal stenosis. No intracranial aneurysm is identified. Posterior circulation: The intracranial right vertebral artery is patent with mild/moderate stenosis in the distal V4 segment. Marked paucity of enhancement within the distal intracranial left vertebral artery just proximal to the vertebrobasilar junction which may reflect high-grade, near occlusive stenosis or focal occlusion. The basilar artery is patent with mild atherosclerotic irregularity. Fetal origin of the left posterior cerebral artery. The bilateral posterior cerebral arteries are patent. Sites of moderate to moderately severe stenosis within the mid P2 posterior cerebral arteries bilaterally. A right posterior communicating artery is not definitively identified and may be hypoplastic or absent. Venous sinuses: Within limitations of contrast timing, no convincing thrombus. Anatomic variants: As described Review of the MIP images confirms the above findings CT Brain Perfusion Findings: CBF (<30%) Volume: Perfusion (Tmax>6.0s) volume: Mismatch Volume: 17mL Infarction Location:Right MCA vascular territory Findings of M1 right MCA proximal occlusion and extensive ischemic infarction change throughout the right MCA vascular territory were called by telephone at the time of interpretation on 11/13/2019 at 12:38 pm to provider [facility] , who verbally acknowledged these results. IMPRESSION: CT head: 1. Extensive abnormal hypodensity throughout much of the right MCA vascular territory consistent with acute/early subacute ischemic infarction. No evidence of hemorrhagic conversion. No significant mass effect at this time. 2. Background chronic ischemic changes as detailed 3. Mild generalized parenchymal atrophy. 4. Air-fluid level and frothy secretions within the left maxillary sinus. Correlate for acute sinusitis. CTA neck: 1. The bilateral common carotid, internal carotid and vertebral  arteries are patent within the neck without significant stenosis (50% or greater). 2. Mixed plaque within the carotid bifurcations and proximal internal carotid arteries bilaterally. There is possible ulcerated plaque within the right carotid bulb. There is lobular dilation of the bilateral carotid bulbs. Additionally, there is significant tortuosity of the proximal left ICA. 3. 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 Please correlate with findings on concurrently performed CTA chest/abdomen/pelvis. 4. 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 5. 1.8 cm nodule within the right thyroid lobe. Nonemergent dedicated thyroid ultrasound is recommended for further evaluation, as clinically appropriate. CTA head: 1. 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 No significant reconstitution of the right M1 MCA more distally. There is minimal reconstitution of right M2 and more distal right MCA branches. 2. Posterior circulation atherosclerotic stenoses. Most notably, there is focal  near occlusive stenosis versus focal occlusion of the distal left vertebral artery just proximal to the vertebrobasilar junction. Moderate to moderately severe stenoses within the mid P2 posterior cerebral arteries bilaterally. 3. Mild atherosclerotic plaque within the intracranial internal carotid arteries. CT perfusion head: The perfusion software identifies a 122 mL core infarct within the right MCA vascular territory. The perfusion software identifies a 139 mL region of critically hypoperfused parenchyma within the right MCA vascular territory. Reported mismatch volume 17 mL. Electronically Signed: By: Jackey Loge DO On: 12/05/2019 13:05   DG Chest Port 1 View  Result Date: 2019/12/05 CLINICAL DATA:  Altered mental status EXAM: PORTABLE CHEST 1 VIEW COMPARISON:  None. FINDINGS: Patient rotated. Lungs are clear. Heart size and pulmonary vascularity are  normal. No adenopathy. There is prominence of the descending thoracic aorta with questionable aneurysmal dilatation in the mid to distal descending aorta. No adenopathy. IMPRESSION: 1.  Lungs clear. 2. Prominence of the descending aorta with questionable degree of aneurysmal dilatation distally. Electronically Signed   By: Bretta Bang III M.D.   On: December 05, 2019 10:44   ECHOCARDIOGRAM COMPLETE  Result Date: 12-05-19    ECHOCARDIOGRAM REPORT   Patient Name:   Paula Kim Date of Exam: Dec 05, 2019 Medical Rec #:  [id number]       Height:       61.0 in Accession #:    [id number]      Weight:       158.4 lb Date of Birth:  Nov 03, 1955       BSA:          1.71 m Patient Age:    63 years        BP:           144/96 mmHg Patient Gender: F               HR:           120 bpm. Exam Location:  Inpatient Procedure: 2D Echo, Cardiac Doppler and Color Doppler Indications:    Stroke 434.91 / I163.9  History:        Patient has no prior history of Echocardiogram examinations.                 Stroke, Signs/Symptoms:Dyspnea; Risk Factors:Dyslipidemia.  Sonographer:    Elmarie Shiley Dance Referring Phys: Tara.Kingdom MCNEILL P KIRKPATRICK IMPRESSIONS  1. Left ventricular ejection fraction, by estimation, is 60 to 65%. The left ventricle has normal function. The left ventricle has no regional wall motion abnormalities. Indeterminate diastolic filling due to E-A fusion.  2. Right ventricular systolic function is normal. The right ventricular size is normal. Tricuspid regurgitation signal is inadequate for assessing PA pressure.  3. The mitral valve is normal in structure and function. No evidence of mitral valve regurgitation. No evidence of mitral stenosis.  4. The aortic valve is tricuspid. Aortic valve regurgitation is not visualized. Mild aortic valve sclerosis is present, with no evidence of aortic valve stenosis.  5. The inferior vena cava is normal in size with greater than 50% respiratory variability, suggesting right atrial  pressure of 3 mmHg.  6. Technically difficult study with poor acoustic windows. FINDINGS  Left Ventricle: Left ventricular ejection fraction, by estimation, is 60 to 65%. The left ventricle has normal function. The left ventricle has no regional wall motion abnormalities. The left ventricular internal cavity size was normal in size. There is  no left ventricular hypertrophy. Indeterminate diastolic filling due to E-A fusion. Right Ventricle: The right ventricular size  is normal. No increase in right ventricular wall thickness. Right ventricular systolic function is normal. Tricuspid regurgitation signal is inadequate for assessing PA pressure. Left Atrium: Left atrial size was normal in size. Right Atrium: Right atrial size was normal in size. Pericardium: There is no evidence of pericardial effusion. Mitral Valve: The mitral valve is normal in structure and function. Mild mitral annular calcification. No evidence of mitral valve regurgitation. No evidence of mitral valve stenosis. Tricuspid Valve: The tricuspid valve is normal in structure. Tricuspid valve regurgitation is not demonstrated. Aortic Valve: The aortic valve is tricuspid. Aortic valve regurgitation is not visualized. Mild aortic valve sclerosis is present, with no evidence of aortic valve stenosis. Pulmonic Valve: The pulmonic valve was not well visualized. Pulmonic valve regurgitation is not visualized. Aorta: The aortic root is normal in size and structure. Venous: The inferior vena cava is normal in size with greater than 50% respiratory variability, suggesting right atrial pressure of 3 mmHg. IAS/Shunts: No atrial level shunt detected by color flow Doppler.  LEFT VENTRICLE PLAX 2D LVIDd:         4.20 cm LVIDs:         3.30 cm LV PW:         1.00 cm LV IVS:        0.80 cm LVOT diam:     1.80 cm LV SV:         44.28 ml LV SV Index:   19.30 LVOT Area:     2.54 cm  RIGHT VENTRICLE             IVC RV Basal diam:  2.30 cm     IVC diam: 1.30 cm RV S  prime:     11.20 cm/s TAPSE (M-mode): 1.8 cm LEFT ATRIUM             Index       RIGHT ATRIUM           Index LA diam:        2.90 cm 1.70 cm/m  RA Area:     10.30 cm LA Vol (A2C):   23.2 ml 13.56 ml/m RA Volume:   20.20 ml  11.81 ml/m LA Vol (A4C):   26.0 ml 15.20 ml/m LA Biplane Vol: 26.0 ml 15.20 ml/m  AORTIC VALVE LVOT Vmax:   122.00 cm/s LVOT Vmean:  76.600 cm/s LVOT VTI:    0.174 m  AORTA Ao Root diam: 3.10 cm Ao Asc diam:  3.10 cm MITRAL VALVE MV Area (PHT): 5.14 cm     SHUNTS MV Decel Time: 148 msec     Systemic VTI:  0.17 m MV E velocity: 128.50 cm/s  Systemic Diam: 1.80 cm Marca Ancona MD Electronically signed by Marca Ancona MD Signature Date/Time: 11/21/2019/4:56:42 PM    Final    CT Angio Chest/Abd/Pel for Dissection W and/or W/WO  Result Date: 11/04/2019 CLINICAL DATA:  Chest and abdominal pain. EXAM: CT ANGIOGRAPHY CHEST, ABDOMEN AND PELVIS TECHNIQUE: Multidetector CT imaging through the chest, abdomen and pelvis was performed using the standard protocol during bolus administration of intravenous contrast. Multiplanar reconstructed images and MIPs were obtained and reviewed to evaluate the vascular anatomy. CONTRAST:  OMNIPAQUE IOHEXOL 350 MG/ML SOLN COMPARISON:  Chest x-ray 11/10/2019 FINDINGS: CTA CHEST FINDINGS Cardiovascular: The heart is normal in size. No pericardial effusion. There is fusiform aneurysmal dilatation of the descending thoracic aorta beginning at the posterior arch. Maximum transverse diameter at the posterior arches. Maximum diameter distally just above the  aortic hiatus is 4.4 cm with significant mural thrombus. No dissection or aortic rupture is identified. No intramural hematoma. The aortic branch vessels are patent. Three-vessel coronary artery calcifications are noted. Mediastinum/Nodes: No mediastinal or hilar mass or adenopathy. The esophagus is grossly normal. Lungs/Pleura: Emphysematous changes and pulmonary scarring. No worrisome pulmonary lesions or  acute pulmonary findings. Musculoskeletal: No chest wall mass, breast masses, supraclavicular or axillary adenopathy. There is a 17 mm right thyroid nodule which will require further evaluation with ultrasound and potentially need to be biopsied. Other smaller thyroid nodules are noted bilaterally. Review of the MIP images confirms the above findings. CTA ABDOMEN AND PELVIS FINDINGS VASCULAR Aorta: Tortuosity, ectasia and calcification of the abdominal aorta. Maximum diameter is 4.8 cm. There is extensive mural thrombus and areas of mural thrombus calcification. No dissection. Distally, just above the iliac artery bifurcation the aorta is occluded. Just above the occlusion there are large lumbar collaterals which help reconstitute both common iliac arteries. Celiac: Normal SMA: Normal Renals: Right renal artery calcifications but no significant stenosis. The left renal artery is patent. IMA: Patent. 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 Moderate atherosclerotic calcifications. Veins: Grossly normal. Review of the MIP images confirms the above findings. NON-VASCULAR Hepatobiliary: No focal hepatic lesions are identified. The liver contour is slightly irregular, the hepatic fissures are dilated and the caudate lobe is enlarged all suggesting changes of cirrhosis. Recanalized umbilical vein. Pancreas: No mass, inflammation or ductal dilatation. Spleen: Normal size. No focal lesions. Adrenals/Urinary Tract: The adrenal glands and kidneys are unremarkable. The bladder appears normal. Stomach/Bowel: The stomach, duodenum, small bowel and colon are grossly normal without oral contrast. The terminal ileum is normal. The appendix is normal. Scattered colonic diverticulosis but no findings for acute diverticulitis. Lymphatic: No abdominal or pelvic lymphadenopathy. Reproductive: The uterus and ovaries are normal. Other: No  pelvic mass or pelvic adenopathy. No free pelvic fluid collections. Musculoskeletal: No significant bony findings. Review of the MIP images confirms the above findings. IMPRESSION: 1. 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 No dissection or intramural hematoma. 2. 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 Recommend followup by abdomen and pelvis CTA in 6 months, and vascular surgery referral/consultation if not already obtained. This recommendation follows ACR consensus guidelines: White Paper of the ACR Incidental Findings Committee II on Vascular Findings. J Am Coll Radiol 2013; 10:789-794. Aortic aneurysm NOS (ICD10-I71.9) 3. 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 4. Cirrhotic changes involving the liver. No worrisome hepatic lesions. 5. Emphysematous changes and pulmonary scarring but no acute pulmonary findings. 6. 17 mm right thyroid nodule. Recommend thyroid US and potential biopsy (ref: J Am Coll Radiol. 2015 Feb;12(2): 143-50). Aortic Atherosclerosis (ICD10-I70.0) and Emphysema (ICD10-J43.9). Electronically Signed   By: Marijo Sanes M.D.   On: 12/09/2019 12:56   [place] EKG SITE RITE  Result Date: December 09, 2019 If Site Rite image not attached, placement could not be confirmed due to current cardiac rhythm.   PHYSICAL EXAM Middle-aged Caucasian lady who is intubated.  She has dressing on her head from her recent right hemicraniectomy.  The craniotomy flap is placed in the abdomen. . Afebrile. Head is nontraumatic. Neck is supple without bruit.    Cardiac exam no murmur or gallop. Lungs are clear to auscultation. Distal pulses are well felt.  Neurological Exam :  Patient is drowsy but can be aroused.  Eyes cannot be open due to swelling of the eyelids from surgery.  Pupils difficult to visualize.  Mild left lower facial  weakness.  Tongue midline.  She follows commands on the right side moving right upper and lower extremities spontaneously and also to commands.  She has trace withdrawal in the left lower extremity to pain but none in the upper extremity.  There is decreased tone and flaccidity on the left.  ASSESSMENT/PLAN Paula Kim is a 64 y.o. female with history of HLD found down presenting with L sided weakness and dysarthria.  Stroke:   Large R MCA infarct w/ petechial hemorrhage R parietal s/p crani - likely thromboembolic from large vessel disease source  CT head extensive hypodensity R MCA territory w/ infarct. Small vessel disease. Atrophy. Sinus dz, ? Acute.   CT CS no gx. Multilevel disc disease. Thoracic aortic aneurysm. R thyroid nodule. centrilobar emphysema.   CTA head proximal R M1 occlusion. Posterior circulation atherosclerosis - L VA stenosis vs focal occlusion. B mid P2 PCA moderate to severe stenoses.  CTA neck mixed plaque bifurcations and proximal B ICAs. Possible ulcerated plaque R ICA bulb. Lobar dilation B ICA bulbs. L ICA tortuous. 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 R thyroid nodule. Possible large vessel vasculopathy  CT perfusion R MCA core infarct and penumbra. 17mL mismatch  MRI  R MCA infarct w/ R parietal petechial hemorrhage. Chronic L parietooccipital infarcts. Moderate to advanced small vessel disease. Mild atrophy. L maxillary sinus dz, ? Acute.  2D Echo EF 60-65%. No source of embolus   LDL 70  HgbA1c 5.0  Heparin 5000 units sq tid for VTE prophylaxis  aspirin 325 mg daily prior to admission, now on aspirin 325 mg daily. Ok to continue as petechial hemorrhage small.  Therapy recommendations:  pending   Disposition:  pending   Acute Respiratory Failure Acute sinusitis R maxillary  Intubated  CCM onboard  Cytotoxic Cerebral Edema  S/p Crani by Cabbell on 11/19/2019  On  Keppra  On 3% protocol  Gtt via PIV   Na 140 at onset->147->152  Given accelerated increase, rate decreased from 75->50  Blood Pressure  Home meds:  None, no hx HTN  Stable . SBP goal < 140 . Long-term BP goal normotensive  Hyperlipidemia  Home meds:  pravachol 40, fish oil, fenofibrate  LDL 70, goal < 70  Hold meds at present given petechial hemorrhage   Consider resuming statin and meds at discharge  Dysphagia . Secondary to stroke . NPO . Starting TF today . 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 F/u in 6 mos. 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 Liver cirrhotic changes.  Emphysema. Pulmonary scarring. R thyroid nodule 17mm.  Repeat in 6 mos  Other Stroke Risk Factors  Former Cigarette smoker, quit 13 yrs ago  Substance abuse - UDS:  THC POSITIVE, Will advise to stop using due to stroke risk.  Overweight, Body mass index is 27.91 kg/m., recommend weight loss, diet and exercise as appropriate   Hx stroke/TIA  06/2009 - midbrain and L PCA territory infarcts. Small vessel disease.    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   Proximal L subclavian pseudoaneurysm.   R thyroid nodule. Nonemergent [place] recommended  Depression on effexor  Hospital day # 1  I have personally obtained history,examined this patient, reviewed notes, independently viewed imaging studies, participated in medical decision making and plan of care.ROS completed by me personally and pertinent positives fully documented  I have made any additions or clarifications directly to the above note.  Continue close neurological monitoring and blood  pressure control.  Continue induced hyponatremia with serum sodium goal 150/155.  Repeat CT scan of the head today to image cytotoxic edema and any shift.  Start aspirin after CT scan if no bleed keep intubated today and consider extubation and weaning tomorrow if clinical exam continues to improve.  Long discussion with Dr. Warren Lacy critical care medicine and answered questions. This patient is critically ill and at significant risk of neurological worsening, death and care requires constant monitoring of vital signs, hemodynamics,respiratory and cardiac monitoring, extensive review of multiple databases, frequent neurological assessment, discussion with family, other specialists and medical decision making of high complexity.I have made any additions or clarifications directly to the above note.This critical care time does not reflect procedure time, or teaching time or supervisory time of PA/NP/Med Resident etc but could involve care discussion time.  I spent 30 minutes of neurocritical care time  in the care of  this patient.     Delia Heady, MD Medical Director [facility] Pager: [phone] 11/15/2019 4:30 PM   To contact Stroke Continuity provider, please refer to [email or web address]. After hours, contact General Neurology

## 2019-11-15 NOTE — Progress Notes (Signed)
OT Cancellation Note  Patient Details Name: Paula Kim MRN: [id number] DOB: 1956/04/25   Cancelled Treatment:    Reason Eval/Treat Not Completed: Active bedrest order(Intubated.) Will return as schedule allows and pt medically ready.  Wanetta Funderburke M Brandi Armato Terrisha Lopata MSOT, OTR/L Acute Rehab Pager: [phone] Office: [phone] 11/15/2019, 7:27 AM

## 2019-11-15 NOTE — Progress Notes (Signed)
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  On imaging she already had a large right MCA territory infarct.  She underwent a decompressive craniectomy on the evening of 2/18.   History of present illness        Unfortunately we have very little history on this 64 year old.  A friend tried to call her on the morning of admission, as she usually does at 6 AM, and when she was unable to awaken the patient went to visit and found her unresponsive.  She was brought to our department of emergency medicine where a CT and CTA were performed.  She has a large right MCA infarct and an M1 occlusion.  She is not a candidate for either thrombolytics or thrombectomy and she has been seen by neurosurgery who is anticipating taking her to the operating room this evening.  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 Past surgical history is remarkable for a laparoscopic cholecystectomy.  Significant Hospital Events   Admission November 30, 2019.  Decompressive craniectomy 2/18  Consults:    Procedures:  Decompressive craniectomy 2/18  Significant Diagnostic Tests:  CT and CTA showing a large right MCA infarct and M1 occlusion  Micro Data:  None  Antimicrobials:  None   Interim history/subjective:    Objective   Blood pressure 112/60, pulse 77, temperature 98.9 F (37.2 C), temperature source Oral, resp. rate (!) 21, height 5\' 1"  (1.549 m), weight 67 kg, SpO2 100 %.    Vent Mode: CPAP;PSV FiO2 (%):  [40 %] 40 % Set Rate:  [16 bmp] 16 bmp Vt Set:  [380 mL] 380 mL PEEP:  [5 cmH20] 5 cmH20 Pressure Support:  [10 cmH20] 10 cmH20 Plateau  Pressure:  [8 cmH20-13 cmH20] 10 cmH20   Intake/Output Summary (Last 24 hours) at 11/15/2019 0758 Last data filed at 11/15/2019 11/17/2019 Gross per 24 hour  Intake 1309.46 ml  Output 750 ml  Net 559.46 ml   Filed Weights   November 30, 2019 2034  Weight: 67 kg    Examination: General: The patient appears to be her stated age.  She is orally intubated with a bulky dressing on her head.   HENT: She has a fair amount of facial edema and an overtly class IV airway with a tongue protruding from the mouth.   Lungs: She is orally intubated, respirations are unlabored on CPAP, there is symmetric air movement no wheezes.   Cardiovascular: There is no JVD or edema.  S1 and S2 are regular without murmur rub or gallop.   Abdomen: The abdomen is soft without organomegaly masses tenderness guarding or rebound.  She is anicteric.  The abdomen is soft loading organomegaly masses tenderness guarding or rebound Extremities: The extremities are warm.   Neuro: She is a little lethargic but when awakened follow simple instructions.  She moves the right side very vigorously she does not move the left at all.  Pupils are equal.     Resolved Hospital Problem list     Assessment & Plan:  This is a 64 year old who has suffered from a large right MCA territory infarct.  She is status post decompressive craniectomy on 2/18.  She remains intubated and mechanically ventilated.  Although her mental status per se might not preclude extubation, her facial edema and the status of her airway does.  I am anticipating keeping her intubated until some of her facial edema resolves and until it is clear that her neurological status will be stable.   Best practice:  Diet: N.p.o. pending surgery Pain/Anxiety/Delirium protocol (if indicated): Not indicated VAP protocol (if indicated): In place DVT prophylaxis: SCDs only due to impending neurosurgery GI prophylaxis: Pepcid Glucose control: None required Mobility: Bed rest Code Status:  Full Family Communication: Family is aware of the plan of care Disposition:   Labs   CBC: Recent Labs  Lab 11/19/2019 1059 11/13/2019 1200 11/05/2019 2112  WBC  --  11.1*  --   NEUTROABS  --  9.5*  --   HGB 13.9 13.0 9.5*  HCT 41.0 38.0 28.0*  MCV  --  89.4  --   PLT  --  259  --     Basic Metabolic Panel: Recent Labs  Lab 11/11/2019 1030 11/13/2019 1030 11/21/2019 1059 10/29/2019 1650 11/06/2019 2041 11/11/2019 2112 11/15/19 0345  NA 140   < > 140 140 138 140 147*  K 3.2*  --  3.0*  --   --  2.7* 3.6  CL 102  --  104  --   --   --  116*  CO2 23  --   --   --   --   --  20*  GLUCOSE 127*  --  112*  --   --   --  204*  BUN 12  --  12  --   --   --  12  CREATININE 0.77  --  0.50  --   --   --  0.80  CALCIUM 9.5  --   --   --   --   --  7.9*  MG  --   --   --   --   --   --  2.0   < > = values in this interval not displayed.   GFR: Estimated Creatinine Clearance: 63.1 mL/min (by C-G formula based on SCr of 0.8 mg/dL). Recent Labs  Lab 11/15/2019 1200  WBC 11.1*    Liver Function Tests: Recent Labs  Lab 11/19/2019 1030  AST 29  ALT 18  ALKPHOS 101  BILITOT 1.0  PROT 8.1  ALBUMIN 4.2   No results for input(s): LIPASE, AMYLASE in the last 168 hours. No results for input(s): AMMONIA in the last 168 hours.  ABG    Component Value Date/Time   PHART 7.374 11/20/2019 2112   PCO2ART 41.0 11/03/2019 2112   PO2ART 114.0 (H) 11/22/2019 2112   HCO3 23.9 11/02/2019 2112   TCO2 25 11/20/2019 2112   ACIDBASEDEF 1.0 11/22/2019 2112   O2SAT 98.0 10/28/2019 2112     Coagulation Profile: Recent Labs  Lab 11/04/2019 1030  INR 1.0    Cardiac Enzymes: No results for input(s): CKTOTAL, CKMB, CKMBINDEX, TROPONINI in the last 168 hours.  HbA1C: Hgb A1c MFr Bld  Date/Time Value Ref Range Status  11/15/2019 03:45 AM 5.0 4.8 - 5.6 % Final    Comment:    (NOTE) Pre diabetes:          5.7%-6.4% Diabetes:              >6.4% Glycemic control for   <7.0% adults with diabetes      CBG: Recent Labs  Lab 11/24/2019 1026  GLUCAP 124*    Review of Systems:   Not obtainable  Past Medical History  She,  has a past medical history of Anemia, Depression, Gallstones, Hyperlipidemia, Nocturia, PONV (postoperative nausea and vomiting), Shortness of breath, Skew deviation of eye, left, and Stroke ([facility]).   Surgical History    Past Surgical History:  Procedure Laterality Date  . CHOLECYSTECTOMY  03/16/2012   Procedure: LAPAROSCOPIC CHOLECYSTECTOMY WITH INTRAOPERATIVE CHOLANGIOGRAM;  Surgeon: Lodema Pilot, DO;  Location: [facility];  Service: General;  Laterality: N/A;  Laparoscopic Cholecystectomy,intraoperative Cholangiogram  . ERCP  03/16/2012   Procedure: ENDOSCOPIC RETROGRADE CHOLANGIOPANCREATOGRAPHY (ERCP);  Surgeon: Theda Belfast, MD;  Location: [facility];  Service: Endoscopy;  Laterality: N/A;  . TUBAL LIGATION       Social History   reports that she quit smoking about 13 years ago. She does not have any smokeless tobacco history on file. She reports that she does not drink alcohol or use drugs.   Family History   Her family history includes COPD in her father; Cancer in her maternal grandfather.   Allergies Allergies  Allergen Reactions  . Erythromycin Swelling and Rash     Home Medications  Prior to Admission medications   Medication Sig Start Date End Date Taking? Authorizing Provider  aspirin EC 325 MG tablet Take 325 mg by mouth daily.    [provider]  famotidine (PEPCID) 20 MG tablet Take 20 mg by mouth 2 (two) times daily.    [provider]  fenofibrate micronized (LOFIBRA) 200 MG capsule Take 200 mg by mouth daily.    [provider]  fish oil-omega-3 fatty acids 1000 MG capsule Take 1 g by mouth 2 (two) times daily.    [provider]  methylcellulose (ARTIFICIAL TEARS) 1 % ophthalmic solution Place 1 drop into both eyes as needed. Dry eyes    [provider]  niacin (SLO-NIACIN) 500 MG tablet  Take 500 mg by mouth 2 (two) times daily with a meal.    [provider]  pravastatin (PRAVACHOL) 40 MG tablet Take 40 mg by mouth daily.    [provider]  venlafaxine (EFFEXOR-XR) 150 MG 24 hr capsule Take 150 mg by mouth daily.    [provider]  vitamin B-12 (CYANOCOBALAMIN) 500 MCG tablet Take 500 mcg by mouth 2 (two) times daily.    [provider]  zolpidem (AMBIEN) 10 MG tablet Take 10 mg by mouth at bedtime as needed. For sleep.    [provider]     Critical care time: Greater than 32 minutes     Penny Pia, MD Critical Care Medicine

## 2019-11-15 NOTE — Progress Notes (Signed)
Peripherally Inserted Central Catheter/Midline Placement  The IV Nurse has discussed with the patient and/or persons authorized to consent for the patient, the purpose of this procedure and the potential benefits and risks involved with this procedure.  The benefits include less needle sticks, lab draws from the catheter, and the patient may be discharged home with the catheter. Risks include, but not limited to, infection, bleeding, blood clot (thrombus formation), and puncture of an artery; nerve damage and irregular heartbeat and possibility to perform a PICC exchange if needed/ordered by physician.  Alternatives to this procedure were also discussed.  Bard Power PICC patient education guide, fact sheet on infection prevention and patient information card has been provided to patient /or left at bedside.    PICC/Midline Placement Documentation  PICC Double Lumen 11/15/19 PICC Right Basilic 38 cm 0 cm (Active)  Indication for Insertion or Continuance of Line Vasoactive infusions 11/15/19 1653  Exposed Catheter (cm) 0 cm 11/15/19 1653  Site Assessment Clean;Dry;Intact 11/15/19 1653  Lumen #1 Status Flushed;Blood return noted;Saline locked 11/15/19 1653  Lumen #2 Status Flushed;Blood return noted;Saline locked 11/15/19 1653  Dressing Type Transparent 11/15/19 1653  Dressing Status Clean;Dry;Intact;Antimicrobial disc in place 11/15/19 1653  Dressing Change Due 11/22/19 11/15/19 1653       Audrie Gallus 11/15/2019, 4:55 PM

## 2019-11-15 NOTE — Progress Notes (Signed)
Unable to get consent from niece at this time. Floor RN aware.

## 2019-11-16 ENCOUNTER — Inpatient Hospital Stay (HOSPITAL_COMMUNITY): Payer: Self-pay

## 2019-11-16 DIAGNOSIS — Z9889 Other specified postprocedural states: Secondary | ICD-10-CM

## 2019-11-16 DIAGNOSIS — I9589 Other hypotension: Secondary | ICD-10-CM

## 2019-11-16 DIAGNOSIS — I639 Cerebral infarction, unspecified: Secondary | ICD-10-CM

## 2019-11-16 DIAGNOSIS — G936 Cerebral edema: Secondary | ICD-10-CM

## 2019-11-16 DIAGNOSIS — I712 Thoracic aortic aneurysm, without rupture: Secondary | ICD-10-CM

## 2019-11-16 LAB — GLUCOSE, CAPILLARY
Glucose-Capillary: 137 mg/dL — ABNORMAL HIGH (ref 70–99)
Glucose-Capillary: 131 mg/dL — ABNORMAL HIGH (ref 70–99)
Glucose-Capillary: 140 mg/dL — ABNORMAL HIGH (ref 70–99)
Glucose-Capillary: 130 mg/dL — ABNORMAL HIGH (ref 70–99)
Glucose-Capillary: 128 mg/dL — ABNORMAL HIGH (ref 70–99)
Glucose-Capillary: 119 mg/dL — ABNORMAL HIGH (ref 70–99)

## 2019-11-16 LAB — SODIUM
Sodium: 157 mmol/L — ABNORMAL HIGH (ref 135–145)
Sodium: 160 mmol/L — ABNORMAL HIGH (ref 135–145)
Sodium: 159 mmol/L — ABNORMAL HIGH (ref 135–145)
Sodium: 159 mmol/L — ABNORMAL HIGH (ref 135–145)

## 2019-11-16 LAB — CBC
HCT: 24.1 % — ABNORMAL LOW (ref 36.0–46.0)
Hemoglobin: 7.6 g/dL — ABNORMAL LOW (ref 12.0–15.0)
MCH: 30.9 pg (ref 26.0–34.0)
MCHC: 31.5 g/dL (ref 30.0–36.0)
MCV: 98 fL (ref 80.0–100.0)
Platelets: 152 10*3/uL (ref 150–400)
RBC: 2.46 MIL/uL — ABNORMAL LOW (ref 3.87–5.11)
RDW: 13.2 % (ref 11.5–15.5)
WBC: 11.4 10*3/uL — ABNORMAL HIGH (ref 4.0–10.5)
nRBC: 0 % (ref 0.0–0.2)

## 2019-11-16 LAB — BASIC METABOLIC PANEL
Anion gap: 7 (ref 5–15)
BUN: 22 mg/dL (ref 8–23)
CO2: 25 mmol/L (ref 22–32)
Calcium: 8.1 mg/dL — ABNORMAL LOW (ref 8.9–10.3)
Chloride: 125 mmol/L — ABNORMAL HIGH (ref 98–111)
Creatinine, Ser: 0.77 mg/dL (ref 0.44–1.00)
GFR calc Af Amer: >60 mL/min (ref >60)
GFR calc non Af Amer: >60 mL/min (ref >60)
Glucose, Bld: 152 mg/dL — ABNORMAL HIGH (ref 70–99)
Potassium: 3.5 mmol/L (ref 3.5–5.1)
Sodium: 157 mmol/L — ABNORMAL HIGH (ref 135–145)

## 2019-11-16 LAB — PHOSPHORUS: Phosphorus: 1.7 mg/dL — ABNORMAL LOW (ref 2.5–4.6)

## 2019-11-16 LAB — MAGNESIUM: Magnesium: 2.4 mg/dL (ref 1.7–2.4)

## 2019-11-16 MED ORDER — ROSUVASTATIN CALCIUM 20 MG PO TABS
20.0000 mg | ORAL_TABLET | Freq: Every day | ORAL | Status: DC
  Administered 2019-11-16 – 2019-11-24 (×9): 20 mg
  Filled 2019-11-16 (×9): qty 1

## 2019-11-16 MED ORDER — FAMOTIDINE 40 MG/5ML PO SUSR
20.0000 mg | Freq: Two times a day (BID) | ORAL | Status: DC
  Administered 2019-11-16 – 2019-11-25 (×19): 20 mg
  Filled 2019-11-16 (×19): qty 2.5

## 2019-11-16 MED ORDER — POTASSIUM PHOSPHATES 15 MMOLE/5ML IV SOLN
25.0000 mmol | Freq: Once | INTRAVENOUS | Status: AC
  Administered 2019-11-16: 15:00:00 25 mmol via INTRAVENOUS
  Filled 2019-11-16: qty 8.33

## 2019-11-16 MED ORDER — ROSUVASTATIN CALCIUM 20 MG PO TABS: 20.0000 mg | ORAL_TABLET | Freq: Every day | ORAL | Status: DC

## 2019-11-16 MED ORDER — SODIUM CHLORIDE 0.9 % IV SOLN
250.0000 mL | INTRAVENOUS | Status: DC
  Administered 2019-11-16 – 2019-11-17 (×2): 250 mL via INTRAVENOUS

## 2019-11-16 MED ORDER — LABETALOL HCL 5 MG/ML IV SOLN: 10.0000 mg | INTRAVENOUS | Status: DC | PRN

## 2019-11-16 MED ORDER — FAMOTIDINE IN NACL 20-0.9 MG/50ML-% IV SOLN
20.0000 mg | Freq: Two times a day (BID) | INTRAVENOUS | Status: DC
  Filled 2019-11-16: qty 50

## 2019-11-16 NOTE — Progress Notes (Signed)
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  On imaging she already had a large right MCA territory infarct.  She underwent a decompressive craniectomy on the evening of 2/18.   History of present illness        Unfortunately we have very little history on this 64 year old.  A friend tried to call her on the morning of admission, as she usually does at 6 AM, and when she was unable to awaken the patient went to visit and found her unresponsive.  She was brought to our department of emergency medicine where a CT and CTA were performed.  She has a large right MCA infarct and an M1 occlusion.  She was not a candidate for either thrombolytics or thrombectomy and she was taken to the operating room for craniectomy on the evening of 2/18.    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 Past surgical history is remarkable for a laparoscopic cholecystectomy.  Significant Hospital Events   Admission 11/19/2019.  Decompressive craniectomy 2/18  Consults:    Procedures:  Decompressive craniectomy 2/18  Significant Diagnostic Tests:  CT and CTA showing a large right MCA infarct and M1 occlusion  Micro Data:  None  Antimicrobials:  None   Interim history/subjective:  She has had an uneventful night.  She is intermittently following instructions.  She remains intubated and mechanically ventilated.  She is sedated on 0.5 mcg of Precedex during my examination.  Objective   Blood pressure 117/68, pulse 94, temperature 97.9 F (36.6 C), temperature source Oral, resp. rate (!) 22, height 5\' 1"  (1.549 m), weight 67 kg, SpO2 100 %.    Vent  Mode: PRVC FiO2 (%):  [40 %] 40 % Set Rate:  [15 bmp-16 bmp] 15 bmp Vt Set:  [380 mL] 380 mL PEEP:  [5 cmH20] 5 cmH20 Pressure Support:  [10 cmH20] 10 cmH20 Plateau Pressure:  [6 cmH20-13 cmH20] 12 cmH20   Intake/Output Summary (Last 24 hours) at 11/16/2019 0707 Last data filed at 11/16/2019 0604 Gross per 24 hour  Intake 2107.62 ml  Output 1160 ml  Net 947.62 ml   Filed Weights   11/10/2019 2034  Weight: 67 kg    Examination: General: The patient is intubated resting in bed and in no acute distress.     HENT: Wound margins are well opposed without drainage or erythema.  Facial edema has decreased substantially she has less edema around the mouth but the tongue still protrudes.     Lungs: She is breathing above the set ventilator rate this morning.  Respirations are unlabored, there is symmetric air movement, no wheezes.     Cardiovascular: There is no JVD or edema.  S1 and S2 are regular without murmur rub or gallop.     Abdomen: The abdomen is soft without organomegaly masses tenderness guarding or rebound.  She is anicteric.  The abdomen is soft without organomegaly masses tenderness guarding or rebound.  She is anicteric. Extremities: The extremities are warm.   Neuro: She is a little lethargic on 0.5 of Precedex but to follow simple instructions when awakened.  She moves the right side spontaneously.  Pupils appear  to be equal.  She does not withdraw the left upper extremity from noxious stimuli at all and there is only slight withdrawal of the left foot.       Resolved Hospital Problem list     Assessment & Plan:  This is a 64 year old who is suffered from a large right MCA territory infarct.  She is status post decompressive craniotomy on 2/18.  Pertinent to the etiology of the stroke echo was not revealing.  She is currently on aspirin to which I have added a statin.   Respiratory mechanics look good however she continues to have some perioral edema and protruding tongue I will  not proceed to extubation until her upper airway edema improves further.    Best practice:  Diet: N.p.o. pending surgery Pain/Anxiety/Delirium protocol (if indicated): Not indicated VAP protocol (if indicated): In place DVT prophylaxis: SCDs only due to impending neurosurgery GI prophylaxis: Pepcid Glucose control: None required Mobility: Bed rest Code Status: Full Family Communication: Family is aware of the plan of care Disposition:   Labs   CBC: Recent Labs  Lab 07-Dec-2019 1059 December 07, 2019 1200 12-07-2019 2112  WBC  --  11.1*  --   NEUTROABS  --  9.5*  --   HGB 13.9 13.0 9.5*  HCT 41.0 38.0 28.0*  MCV  --  89.4  --   PLT  --  259  --     Basic Metabolic Panel: Recent Labs  Lab 12-07-19 1030 12-07-19 1030 12/07/2019 1059 December 07, 2019 1650 Dec 07, 2019 2112 11/15/19 0345 11/15/19 1407 11/15/19 1840 11/16/19 0048  NA 140   < > 140   < > 140 147* 152* 155* 157*  K 3.2*  --  3.0*  --  2.7* 3.6  --   --   --   CL 102  --  104  --   --  116*  --   --   --   CO2 23  --   --   --   --  20*  --   --   --   GLUCOSE 127*  --  112*  --   --  204*  --   --   --   BUN 12  --  12  --   --  12  --   --   --   CREATININE 0.77  --  0.50  --   --  0.80  --   --   --   CALCIUM 9.5  --   --   --   --  7.9*  --   --   --   MG  --   --   --   --   --  2.0  --  2.3  --   PHOS  --   --   --   --   --   --   --  2.9  --    < > = values in this interval not displayed.   GFR: Estimated Creatinine Clearance: 63.1 mL/min (by C-G formula based on SCr of 0.8 mg/dL). Recent Labs  Lab 12-07-2019 1200  WBC 11.1*    Liver Function Tests: Recent Labs  Lab 2019/12/07 1030  AST 29  ALT 18  ALKPHOS 101  BILITOT 1.0  PROT 8.1  ALBUMIN 4.2   No results for input(s): LIPASE, AMYLASE in the last 168 hours. No results for input(s): AMMONIA in the last 168 hours.  ABG    Component Value Date/Time  PHART 7.374 11/19/2019 2112   PCO2ART 41.0 11/09/2019 2112   PO2ART 114.0 (H) 11/12/2019 2112   HCO3  23.9 10/28/2019 2112   TCO2 25 10/30/2019 2112   ACIDBASEDEF 1.0 11/20/2019 2112   O2SAT 98.0 11/23/2019 2112     Coagulation Profile: Recent Labs  Lab 11/06/2019 1030  INR 1.0    Cardiac Enzymes: No results for input(s): CKTOTAL, CKMB, CKMBINDEX, TROPONINI in the last 168 hours.  HbA1C: Hgb A1c MFr Bld  Date/Time Value Ref Range Status  11/15/2019 03:45 AM 5.0 4.8 - 5.6 % Final    Comment:    (NOTE) Pre diabetes:          5.7%-6.4% Diabetes:              >6.4% Glycemic control for   <7.0% adults with diabetes     CBG: Recent Labs  Lab 11/04/2019 1026 11/15/19 1735 11/15/19 1956 11/15/19 2248 11/16/19 0259  GLUCAP 124* 144* 139* 152* 137*    Review of Systems:   Not obtainable  Past Medical History  She,  has a past medical history of Anemia, Depression, Gallstones, Hyperlipidemia, Nocturia, PONV (postoperative nausea and vomiting), Shortness of breath, Skew deviation of eye, left, and Stroke ([place]).   Surgical History    Past Surgical History:  Procedure Laterality Date  . CHOLECYSTECTOMY  03/16/2012   Procedure: LAPAROSCOPIC CHOLECYSTECTOMY WITH INTRAOPERATIVE CHOLANGIOGRAM;  Surgeon: Madilyn Hook, DO;  Location: [facility];  Service: General;  Laterality: N/A;  Laparoscopic Cholecystectomy,intraoperative Cholangiogram  . ERCP  03/16/2012   Procedure: ENDOSCOPIC RETROGRADE CHOLANGIOPANCREATOGRAPHY (ERCP);  Surgeon: Beryle Beams, MD;  Location: [facility];  Service: Endoscopy;  Laterality: N/A;  . TUBAL LIGATION       Social History   reports that she quit smoking about 13 years ago. She does not have any smokeless tobacco history on file. She reports that she does not drink alcohol or use drugs.   Family History   Her family history includes COPD in her father; Cancer in her maternal grandfather.   Allergies Allergies  Allergen Reactions  . Erythromycin Swelling and Rash     Home Medications  Prior to Admission medications   Medication Sig Start Date  End Date Taking? Authorizing Provider  aspirin EC 325 MG tablet Take 325 mg by mouth daily.    [provider]  famotidine (PEPCID) 20 MG tablet Take 20 mg by mouth 2 (two) times daily.    [provider]  fenofibrate micronized (LOFIBRA) 200 MG capsule Take 200 mg by mouth daily.    [provider]  fish oil-omega-3 fatty acids 1000 MG capsule Take 1 g by mouth 2 (two) times daily.    [provider]  methylcellulose (ARTIFICIAL TEARS) 1 % ophthalmic solution Place 1 drop into both eyes as needed. Dry eyes    [provider]  niacin (SLO-NIACIN) 500 MG tablet Take 500 mg by mouth 2 (two) times daily with a meal.    [provider]  pravastatin (PRAVACHOL) 40 MG tablet Take 40 mg by mouth daily.    [provider]  venlafaxine (EFFEXOR-XR) 150 MG 24 hr capsule Take 150 mg by mouth daily.    [provider]  vitamin B-12 (CYANOCOBALAMIN) 500 MCG tablet Take 500 mcg by mouth 2 (two) times daily.    [provider]  zolpidem (AMBIEN) 10 MG tablet Take 10 mg by mouth at bedtime as needed. For sleep.    [provider]  Penny Pia, MD Critical Care Medicine

## 2019-11-16 NOTE — Progress Notes (Signed)
VASCULAR LAB PRELIMINARY  PRELIMINARY  PRELIMINARY  PRELIMINARY  Bilateral lower extremity venous duplex completed.    Preliminary report:  See CV proc for preliminary results.   Kyrsten Deleeuw, RVT 11/16/2019, 1:02 PM

## 2019-11-16 NOTE — Progress Notes (Signed)
STROKE TEAM PROGRESS NOTE   INTERVAL HISTORY RN at bedside. Pt still intubated on vent but weaning. Very lethargic still on precedex. Na 157. Spontaneous movement on the right but not on the left. Right gaze preference. CT head yesterday post crani showed stable infarct and MLS. On levophed overnight, BP stable now, will taper off.   Vitals:   11/16/19 0530 11/16/19 0545 11/16/19 0600 11/16/19 0615  BP: (!) 124/53 134/73 (!) 151/76 117/68  Pulse:  86 94   Resp: (!) 23 (!) 24 (!) 32 (!) 22  Temp:      TempSrc:      SpO2:  100% 100%   Weight:      Height:        CBC:  Recent Labs  Lab 11/01/2019 1200 11/22/2019 2112  WBC 11.1*  --   NEUTROABS 9.5*  --   HGB 13.0 9.5*  HCT 38.0 28.0*  MCV 89.4  --   PLT 259  --     Basic Metabolic Panel:  Recent Labs  Lab 11/15/2019 1030 11/02/2019 1030 11/01/2019 1059 11/18/2019 1650 11/06/2019 2112 10/28/2019 2112 11/15/19 0345 11/15/19 1407 11/15/19 1840 11/16/19 0048  NA 140   < > 140   < > 140   < > 147*   < > 155* 157*  K 3.2*   < > 3.0*  --  2.7*  --  3.6  --   --   --   CL 102   < > 104  --   --   --  116*  --   --   --   CO2 23  --   --   --   --   --  20*  --   --   --   GLUCOSE 127*   < > 112*  --   --   --  204*  --   --   --   BUN 12   < > 12  --   --   --  12  --   --   --   CREATININE 0.77   < > 0.50  --   --   --  0.80  --   --   --   CALCIUM 9.5  --   --   --   --   --  7.9*  --   --   --   MG  --   --   --   --   --   --  2.0  --  2.3  --   PHOS  --   --   --   --   --   --   --   --  2.9  --    < > = values in this interval not displayed.   Lipid Panel:     Component Value Date/Time   CHOL 125 11/15/2019 0345   TRIG 151 (H) 11/15/2019 0345   HDL 25 (L) 11/15/2019 0345   CHOLHDL 5.0 11/15/2019 0345   VLDL 30 11/15/2019 0345   LDLCALC 70 11/15/2019 0345   HgbA1c:  Lab Results  Component Value Date   HGBA1C 5.0 11/15/2019   Urine Drug Screen:     Component Value Date/Time   LABOPIA NONE DETECTED 11/15/2019 1315    COCAINSCRNUR NONE DETECTED 11/06/2019 1315   LABBENZ NONE DETECTED 11/13/2019 1315   AMPHETMU NONE DETECTED 11/22/2019 1315   THCU POSITIVE (A) 11/22/2019 1315   LABBARB NONE DETECTED 11/02/2019  1315    Alcohol Level     Component Value Date/Time   ETH <10 2019/11/24 1030    IMAGING  CT Code Stroke CTA Head W/WO contrast 2019/11/24   ADDENDUM: The presence of a thoracic aortic aneurysm, lobular dilation of both carotid bulbs and 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 Addendum called by telephone on 02-28-2021at 6:06 pmto provider Dr. Wilford Corner, Who verbally acknowledged these results. Electronically Signed   By: Jackey Loge DO   On: 24-Nov-2019 18:07  IMPRESSION:   CT head:  1. Extensive abnormal hypodensity throughout much of the right MCA vascular territory consistent with acute/early subacute ischemic infarction. No evidence of hemorrhagic conversion. No significant mass effect at this time.  2. Background chronic ischemic changes as detailed  3. Mild generalized parenchymal atrophy.  4. Air-fluid level and frothy secretions within the left maxillary sinus. Correlate for acute sinusitis.   CTA neck:  1. The bilateral common carotid, internal carotid and vertebral arteries are patent within the neck without significant stenosis (50% or greater).  2. Mixed plaque within the carotid bifurcations and proximal internal carotid arteries bilaterally. There is possible ulcerated plaque within the right carotid bulb. There is lobular dilation of the bilateral carotid bulbs. Additionally, there is significant tortuosity of the proximal left ICA.  3. 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 Please correlate with findings on concurrently performed CTA chest/abdomen/pelvis.  4. 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  5. 1.8 cm nodule within the right  thyroid lobe. Nonemergent dedicated thyroid ultrasound is recommended for further evaluation, as clinically appropriate.   CTA head:  1. 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 No significant reconstitution of the right M1 MCA more distally. There is minimal reconstitution of right M2 and more distal right MCA branches.  2. Posterior circulation atherosclerotic stenoses. Most notably, there is focal near occlusive stenosis versus focal occlusion of the distal left vertebral artery just proximal to the vertebrobasilar junction. Moderate to moderately severe stenoses within the mid P2 posterior cerebral arteries bilaterally.  3. Mild atherosclerotic plaque within the intracranial internal carotid arteries.  CT perfusion head: The perfusion software identifies a 122 mL core infarct within the right MCA vascular territory. The perfusion software identifies a 139 mL region of critically hypoperfused parenchyma within the right MCA vascular territory. Reported mismatch volume 17 mL.   DG Chest 2 View 2019/11/24 IMPRESSION:  No active disease.  Aortic atherosclerosis, tortuosity and ectasia.   CT HEAD WO CONTRAST 11/15/2019 IMPRESSION:  1. Large right MCA infarct with no hemorrhagic transformation.  2. Status post right hemi-craniectomy with mild intracranial mass effect. 5 mm of leftward midline shift. Patent basilar cisterns.  3. Underlying chronic small vessel disease.  MR BRAIN WO CONTRAST 24-Nov-2019 IMPRESSION:  1. Motion degraded exam.  2. Acute ischemic infarction involving much of the right MCA vascular territory. Only mild mass effect at this time. Mild petechial hemorrhage within portions of the right parietal infarction territory.  3. Redemonstrated small chronic cortically based infarcts within the left parietooccipital lobes.  4. Moderate/advanced chronic small vessel ischemic disease with multiple chronic lacunar infarcts, progressed from MRI 07/08/2009.  5. Mild  generalized parenchymal atrophy.  6. Air-fluid level and frothy secretions within the left maxillary sinus. Correlate for acute sinusitis.  CT Angio Chest/Abd/Pel for Dissection W and/or W/WO 11-24-2019 IMPRESSION:  1. Fusiform aneurysmal dilatation of the descending  thoracic aorta measuring up to 4.4 cm. No dissection or intramural hematoma.  2. 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 Recommend followup by abdomen and pelvis CTA in 6 months, and vascular surgery referral/consultation if not already obtained. This recommendation follows ACR consensus guidelines: White Paper of the ACR Incidental Findings Committee II on Vascular Findings. J Am Coll Radiol 2013; 10:789-794. Aortic aneurysm NOS (ICD10-I71.9)  3. 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  4. Cirrhotic changes involving the liver. No worrisome hepatic lesions.  5. Emphysematous changes and pulmonary scarring but no acute pulmonary findings.  6. 17 mm right thyroid nodule. Recommend thyroid US and potential biopsy (ref: J Am Coll Radiol. 2015 Feb;12(2): 143-50).  7. Aortic Atherosclerosis (ICD10-I70.0) and Emphysema (ICD10-J43.9).   CT C-SPINE NO CHARGE 11/08/2019 IMPRESSION:  1. No acute fracture or dislocation of the cervical spine.  2. Multilevel degenerative disc disease and facet arthropathy.  3. Thoracic aortic aneurysm. Please refer to forthcoming CT angiogram report for full evaluation of the vascular findings.  4. 1.8 cm right thyroid lobe nodule. Dedicated thyroid ultrasound is recommended on a nonemergent basis.  5. Centrilobular emphysema. Emphysema (ICD10-J43.9).   DG Chest Port 1 View 11/19/2019 IMPRESSION:  1.  Lungs clear.  2. Prominence of the descending aorta with questionable degree of aneurysmal dilatation distally.   DG Abd Portable 1V 11/15/2019 IMPRESSION:  Enteric  tube in the proximal stomach.  DG Abd Portable 1V 11/15/2019 IMPRESSION:  NG tube noted with its tip over the upper stomach. Side hole is at the gastroesophageal junction. Advancement of the NG tube by approximately 10 cm should be considered.     ECHOCARDIOGRAM COMPLETE 11/21/2019 IMPRESSIONS   1. Left ventricular ejection fraction, by estimation, is 60 to 65%. The left ventricle has normal function. The left ventricle has no regional wall motion abnormalities. Indeterminate diastolic filling due to E-A fusion.   2. Right ventricular systolic function is normal. The right ventricular size is normal. Tricuspid regurgitation signal is inadequate for assessing PA pressure.   3. The mitral valve is normal in structure and function. No evidence of mitral valve regurgitation. No evidence of mitral stenosis.   4. The aortic valve is tricuspid. Aortic valve regurgitation is not visualized. Mild aortic valve sclerosis is present, with no evidence of aortic valve stenosis.   5. The inferior vena cava is normal in size with greater than 50% respiratory variability, suggesting right atrial pressure of 3 mmHg.   6. Technically difficult study with poor acoustic windows.    [place] EKG SITE RITE Result Date: 11/06/2019 If Site Rite image not attached, placement could not be confirmed due to current cardiac rhythm.  ECG - ST rate 109 BPM. (See cardiology reading for complete details)   PHYSICAL EXAM  Temp:  [97.9 F (36.6 C)-100.2 F (37.9 C)] 97.9 F (36.6 C) (02/20 0400) Pulse Rate:  [65-120] 76 (02/20 0721) Resp:  [16-32] 18 (02/20 0721) BP: (77-151)/(50-88) 130/67 (02/20 0900) SpO2:  [85 %-100 %] 100 % (02/20 0721) FiO2 (%):  [40 %] 40 % (02/20 0721)  General - Well nourished, well developed, intubated on sedation.  Ophthalmologic - fundi not visualized due to noncooperation.  Cardiovascular - Regular rate and rhythm.  Neuro - intubated on precedex, eyes closed but open on voice, following  limited commands with wiggle toes and showing fingers but limited on efforts. With eye opening, eyes right  gaze preference, not cross midline, with right gaze sustained nystagmus noted, direction to the right. Blinking to visual threat on the right but not on the left, not tracking, PERRL. Corneal reflex present, gag and cough present. Breathing over the vent.  Facial symmetry not able to test due to ET tube.  Tongue protrusion not cooperative. RUE and RLE spontaneous movement, barely against gravity due to lethargy. On pain stimulation, LUE 0/5 and LLE mild withdraw. DTR 1+ and positive babinski on the left. Sensation, coordination and gait not tested.     ASSESSMENT/PLAN Ms. Paula Kim is a 64 y.o. female with history of HLD found down presenting with L sided weakness and dysarthria.  Stroke:   Large R MCA infarct w/ petechial hemorrhage R parietal s/p decompressive hemicrani - source unclear, ? large vessel vasculopathy  CT head extensive hypodensity R MCA territory w/ infarct.  CTA head proximal R M1 occlusion. Posterior circulation atherosclerosis - L VA stenosis vs focal occlusion. B mid P2 PCA moderate to severe stenoses.  CTA neck mixed plaque bifurcations and proximal B ICAs. Possible ulcerated plaque R ICA bulb. Lobar dilation B ICA bulbs. L ICA tortuous. 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   CT perfusion positive for penumbra  MRI  R MCA infarct w/ R parietal petechial hemorrhage. Chronic L parietooccipital infarcts.   2D Echo EF 60-65%. No source of embolus   Consider 30 day cardiac event monitoring as outpt  LDL 70  HgbA1c 5.0  Heparin 5000 units sq tid for VTE prophylaxis  aspirin 325 mg daily prior to admission, now on aspirin 325 mg daily. Ok to continue as petechial hemorrhage small.  Therapy recommendations:  pending   Disposition:  pending   Acute Respiratory Failure  Intubated  On vent  CCM  onboard  Not candidate for extubation at this time  Cerebral Edema  S/p Right decompression hemicrani by Cabbell on Nov 26, 2019  On Keppra for seizure prevention  On 3% saline -> off -> NS @ 40  Na 140->147->152->157->159  Sodium goal 150-160  Sodium monitoring every 6 hours  Hypotension  On low dose Levophed  Stable now . SBP goal < 110-180 . Taper off Levophed as able . Long-term BP goal normotensive  Hyperlipidemia  Home meds:  pravachol 40, fish oil, fenofibrate  LDL 70, goal < 70  On Crestor 20  Continue statin on discharge  Dysphagia . Secondary to stroke . NPO . on TF @ 40 . 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 F/u in 6 mos. 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   Repeat in 6 months  Other Stroke Risk Factors  Former Cigarette smoker, quit 13 yrs ago  Substance abuse - UDS:  THC POSITIVE, Will advise to stop using due to stroke risk.  Hx stroke/TIA  06/2009 - midbrain and L PCA territory infarcts. Small vessel disease.    Other Active Problems  Hx skew deviation since childhood, worsened post stroke  Hypokalemia 2.7-supplement-3.6  R thyroid nodule. Nonemergent [place] recommended  Depression on Effexor  Mild Leukocytosis - 11.1 (afebrile - temp - 97.9 )  Hospital day # 2  This patient is critically ill and at significant risk of neurological worsening, death and care requires constant monitoring of vital signs, hemodynamics,respiratory and cardiac monitoring, extensive review of multiple databases, frequent neurological assessment,  discussion with family, other specialists and medical decision making of high complexity. I spent 35 minutes of neurocritical care time  in the care of  this patient.  I have discussed with Dr. Wallace Cullens.   Marvel Plan,  MD PhD Stroke Neurology 11/16/2019 4:52 PM  To contact Stroke Continuity provider, please refer to [email or web address]. After hours, contact General Neurology

## 2019-11-16 NOTE — Progress Notes (Signed)
Overall stable.  Cranial wound clean and dry.  Scalp flap soft.  Continue supportive efforts.  No new recommendations.

## 2019-11-16 NOTE — Progress Notes (Signed)
PT Cancellation Note  Patient Details Name: Paula Kim MRN: [id number] DOB: 12-20-1955   Cancelled Treatment:    Reason Eval/Treat Not Completed: Active bedrest order, pt intubated and sedated.   Lyanne Co, PT  Acute Rehab Services  Pager [phone] Office [phone]    [name] 11/16/2019, 1:40 PM

## 2019-11-16 NOTE — Progress Notes (Signed)
Patient more awake and attempting to self extubate despite gloves and precidex. Right wrist soft restraint applied, order obtained.

## 2019-11-17 ENCOUNTER — Inpatient Hospital Stay (HOSPITAL_COMMUNITY): Payer: Self-pay

## 2019-11-17 DIAGNOSIS — J9601 Acute respiratory failure with hypoxia: Secondary | ICD-10-CM

## 2019-11-17 DIAGNOSIS — E876 Hypokalemia: Secondary | ICD-10-CM

## 2019-11-17 DIAGNOSIS — I63511 Cerebral infarction due to unspecified occlusion or stenosis of right middle cerebral artery: Principal | ICD-10-CM

## 2019-11-17 DIAGNOSIS — G9340 Encephalopathy, unspecified: Secondary | ICD-10-CM

## 2019-11-17 DIAGNOSIS — D72829 Elevated white blood cell count, unspecified: Secondary | ICD-10-CM

## 2019-11-17 DIAGNOSIS — J9621 Acute and chronic respiratory failure with hypoxia: Secondary | ICD-10-CM

## 2019-11-17 LAB — BASIC METABOLIC PANEL
Anion gap: 10 (ref 5–15)
BUN: 18 mg/dL (ref 8–23)
CO2: 24 mmol/L (ref 22–32)
Calcium: 8.3 mg/dL — ABNORMAL LOW (ref 8.9–10.3)
Chloride: 126 mmol/L — ABNORMAL HIGH (ref 98–111)
Creatinine, Ser: 0.58 mg/dL (ref 0.44–1.00)
GFR calc Af Amer: >60 mL/min (ref >60)
GFR calc non Af Amer: >60 mL/min (ref >60)
Glucose, Bld: 141 mg/dL — ABNORMAL HIGH (ref 70–99)
Potassium: 3.4 mmol/L — ABNORMAL LOW (ref 3.5–5.1)
Sodium: 160 mmol/L — ABNORMAL HIGH (ref 135–145)

## 2019-11-17 LAB — GLUCOSE, CAPILLARY
Glucose-Capillary: 136 mg/dL — ABNORMAL HIGH (ref 70–99)
Glucose-Capillary: 136 mg/dL — ABNORMAL HIGH (ref 70–99)
Glucose-Capillary: 112 mg/dL — ABNORMAL HIGH (ref 70–99)
Glucose-Capillary: 124 mg/dL — ABNORMAL HIGH (ref 70–99)
Glucose-Capillary: 107 mg/dL — ABNORMAL HIGH (ref 70–99)
Glucose-Capillary: 141 mg/dL — ABNORMAL HIGH (ref 70–99)

## 2019-11-17 LAB — PHOSPHORUS: Phosphorus: 3 mg/dL (ref 2.5–4.6)

## 2019-11-17 LAB — SODIUM
Sodium: 158 mmol/L — ABNORMAL HIGH (ref 135–145)
Sodium: 158 mmol/L — ABNORMAL HIGH (ref 135–145)
Sodium: 157 mmol/L — ABNORMAL HIGH (ref 135–145)

## 2019-11-17 LAB — CBC
HCT: 26.9 % — ABNORMAL LOW (ref 36.0–46.0)
Hemoglobin: 8.2 g/dL — ABNORMAL LOW (ref 12.0–15.0)
MCH: 30.1 pg (ref 26.0–34.0)
MCHC: 30.5 g/dL (ref 30.0–36.0)
MCV: 98.9 fL (ref 80.0–100.0)
Platelets: 201 10*3/uL (ref 150–400)
RBC: 2.72 MIL/uL — ABNORMAL LOW (ref 3.87–5.11)
RDW: 13.4 % (ref 11.5–15.5)
WBC: 14.8 10*3/uL — ABNORMAL HIGH (ref 4.0–10.5)
nRBC: 0 % (ref 0.0–0.2)

## 2019-11-17 MED ORDER — LEVETIRACETAM 100 MG/ML PO SOLN
500.0000 mg | Freq: Two times a day (BID) | ORAL | Status: DC
  Administered 2019-11-17 – 2019-11-18 (×3): 500 mg
  Filled 2019-11-17 (×2): qty 5

## 2019-11-17 MED ORDER — LEVETIRACETAM 500 MG PO TABS
500.0000 mg | ORAL_TABLET | Freq: Two times a day (BID) | ORAL | Status: DC
  Filled 2019-11-17: qty 1

## 2019-11-17 MED ORDER — POTASSIUM CHLORIDE 20 MEQ/15ML (10%) PO SOLN
20.0000 meq | Freq: Three times a day (TID) | ORAL | Status: AC
  Administered 2019-11-17 (×3): 20 meq
  Filled 2019-11-17 (×3): qty 15

## 2019-11-17 NOTE — Progress Notes (Signed)
Transported to CT scan and back without complications.  

## 2019-11-17 NOTE — Progress Notes (Signed)
PT Cancellation Note  Patient Details Name: Paula Kim MRN: [id number] DOB: 1955/11/20   Cancelled Treatment:    Reason Eval/Treat Not Completed: Active bedrest order   Van Clines, PT  Acute Rehabilitation Services Pager [phone] Office [phone]    Levi Aland 11/17/2019, 7:37 AM

## 2019-11-17 NOTE — Progress Notes (Signed)
STROKE TEAM PROGRESS NOTE   INTERVAL HISTORY RN and pt niece are at bedside. Pt still intubated on vent. More awake alert than yesterday and following all simple commands on the right, still has left hemiplegia. Na 160. Repeat CT head pending. Off levophed. However, still on precedex.   Vitals:   11/17/19 0515 11/17/19 0530 11/17/19 0545 11/17/19 0600  BP: 132/75 129/69 134/75 130/70  Pulse: 84 86 85 81  Resp: 19 20 19 20   Temp:      TempSrc:      SpO2: 98% 98% 98% 98%  Weight:      Height:        CBC:  Recent Labs  Lab 10/29/2019 1200 11/18/2019 2112 11/16/19 2020 11/17/19 0349  WBC 11.1*  --  11.4* 14.8*  NEUTROABS 9.5*  --   --   --   HGB 13.0   < > 7.6* 8.2*  HCT 38.0   < > 24.1* 26.9*  MCV 89.4  --  98.0 98.9  PLT 259  --  152 201   < > = values in this interval not displayed.    Basic Metabolic Panel:  Recent Labs  Lab 11/15/19 1840 11/16/19 0048 11/16/19 0608 11/16/19 0915 11/16/19 2227 11/17/19 0349  NA 155*   < > 157*   < > 159* 160*  K  --   --  3.5  --   --  3.4*  CL  --   --  125*  --   --  126*  CO2  --   --  25  --   --  24  GLUCOSE  --   --  152*  --   --  141*  BUN  --   --  22  --   --  18  CREATININE  --   --  0.77  --   --  0.58  CALCIUM  --   --  8.1*  --   --  8.3*  MG 2.3  --  2.4  --   --   --   PHOS 2.9  --  1.7*  --   --  3.0   < > = values in this interval not displayed.   Lipid Panel:     Component Value Date/Time   CHOL 125 11/15/2019 0345   TRIG 151 (H) 11/15/2019 0345   HDL 25 (L) 11/15/2019 0345   CHOLHDL 5.0 11/15/2019 0345   VLDL 30 11/15/2019 0345   LDLCALC 70 11/15/2019 0345   HgbA1c:  Lab Results  Component Value Date   HGBA1C 5.0 11/15/2019   Urine Drug Screen:     Component Value Date/Time   LABOPIA NONE DETECTED 11/07/2019 1315   COCAINSCRNUR NONE DETECTED 11/01/2019 1315   LABBENZ NONE DETECTED 11/16/2019 1315   AMPHETMU NONE DETECTED 11/16/2019 1315   THCU POSITIVE (A) 11/04/2019 1315   LABBARB NONE  DETECTED 11/19/2019 1315    Alcohol Level     Component Value Date/Time   ETH <10 11/12/2019 1030    IMAGING  CT Code Stroke CTA Head W/WO contrast 11/09/2019   ADDENDUM: The presence of a thoracic aortic aneurysm, lobular dilation of both carotid bulbs and 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 Addendum called by telephone on 02/26/2021at 6:06 pmto provider Dr. 11/16/2019, Who verbally acknowledged these results. Electronically Signed   By: Wilford Corner DO   On: 11/23/2019 18:07  IMPRESSION:  CT head:  1. Extensive abnormal hypodensity throughout much of the right MCA vascular territory consistent with acute/early subacute ischemic infarction. No evidence of hemorrhagic conversion. No significant mass effect at this time.  2. Background chronic ischemic changes as detailed  3. Mild generalized parenchymal atrophy.  4. Air-fluid level and frothy secretions within the left maxillary sinus. Correlate for acute sinusitis.   CTA neck:  1. The bilateral common carotid, internal carotid and vertebral arteries are patent within the neck without significant stenosis (50% or greater).  2. Mixed plaque within the carotid bifurcations and proximal internal carotid arteries bilaterally. There is possible ulcerated plaque within the right carotid bulb. There is lobular dilation of the bilateral carotid bulbs. Additionally, there is significant tortuosity of the proximal left ICA.  3. 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 Please correlate with findings on concurrently performed CTA chest/abdomen/pelvis.  4. 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  5. 1.8 cm nodule within the right thyroid lobe. Nonemergent dedicated thyroid ultrasound is recommended for further evaluation, as clinically appropriate.   CTA head:  1. Proximal occlusion of the M1 right  middle cerebral artery. No significant reconstitution of the right M1 MCA more distally. There is minimal reconstitution of right M2 and more distal right MCA branches.  2. Posterior circulation atherosclerotic stenoses. Most notably, there is focal near occlusive stenosis versus focal occlusion of the distal left vertebral artery just proximal to the vertebrobasilar junction. Moderate to moderately severe stenoses within the mid P2 posterior cerebral arteries bilaterally.  3. Mild atherosclerotic plaque within the intracranial internal carotid arteries.  CT perfusion head: The perfusion software identifies a 122 mL core infarct within the right MCA vascular territory. The perfusion software identifies a 139 mL region of critically hypoperfused parenchyma within the right MCA vascular territory. Reported mismatch volume 17 mL.   DG Chest 2 View 11/11/2019 IMPRESSION:  No active disease.  Aortic atherosclerosis, tortuosity and ectasia.   CT HEAD WO CONTRAST 11/15/2019 IMPRESSION:  1. Large right MCA infarct with no hemorrhagic transformation.  2. Status post right hemi-craniectomy with mild intracranial mass effect. 5 mm of leftward midline shift. Patent basilar cisterns.  3. Underlying chronic small vessel disease.  MR BRAIN WO CONTRAST 11/21/2019 IMPRESSION:  1. Motion degraded exam.  2. Acute ischemic infarction involving much of the right MCA vascular territory. Only mild mass effect at this time. Mild petechial hemorrhage within portions of the right parietal infarction territory.  3. Redemonstrated small chronic cortically based infarcts within the left parietooccipital lobes.  4. Moderate/advanced chronic small vessel ischemic disease with multiple chronic lacunar infarcts, progressed from MRI 07/08/2009.  5. Mild generalized parenchymal atrophy.  6. Air-fluid level and frothy secretions within the left maxillary sinus. Correlate for acute sinusitis.  CT Angio Chest/Abd/Pel for  Dissection W and/or W/WO 11/02/2019 IMPRESSION:  1. 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 No dissection or intramural hematoma.  2. 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 Recommend followup by abdomen and pelvis CTA in 6 months, and vascular surgery referral/consultation if not already obtained. This recommendation follows ACR consensus guidelines: White Paper of the ACR Incidental Findings Committee II on Vascular Findings. J Am Coll Radiol 2013; 10:789-794. Aortic aneurysm NOS (ICD10-I71.9)  3. 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  4. Cirrhotic changes involving the  liver. No worrisome hepatic lesions.  5. Emphysematous changes and pulmonary scarring but no acute pulmonary findings.  6. 17 mm right thyroid nodule. Recommend thyroid US and potential biopsy (ref: J Am Coll Radiol. 2015 Feb;12(2): 143-50).  7. Aortic Atherosclerosis (ICD10-I70.0) and Emphysema (ICD10-J43.9).   CT C-SPINE NO CHARGE 11/25/2019 IMPRESSION:  1. No acute fracture or dislocation of the cervical spine.  2. Multilevel degenerative disc disease and facet arthropathy.  3. Thoracic aortic aneurysm. Please refer to forthcoming CT angiogram report for full evaluation of the vascular findings.  4. 1.8 cm right thyroid lobe nodule. Dedicated thyroid ultrasound is recommended on a nonemergent basis.  5. Centrilobular emphysema. Emphysema (ICD10-J43.9).   DG Chest Port 1 View 11/25/19 IMPRESSION:  1.  Lungs clear.  2. Prominence of the descending aorta with questionable degree of aneurysmal dilatation distally.   DG Abd Portable 1V 11/15/2019 IMPRESSION:  Enteric tube in the proximal stomach.  DG Abd Portable 1V 11/15/2019 IMPRESSION:  NG tube noted with its tip over the upper stomach. Side hole is at the gastroesophageal  junction. Advancement of the NG tube by approximately 10 cm should be considered.     ECHOCARDIOGRAM COMPLETE 11-25-19 IMPRESSIONS   1. Left ventricular ejection fraction, by estimation, is 60 to 65%. The left ventricle has normal function. The left ventricle has no regional wall motion abnormalities. Indeterminate diastolic filling due to E-A fusion.   2. Right ventricular systolic function is normal. The right ventricular size is normal. Tricuspid regurgitation signal is inadequate for assessing PA pressure.   3. The mitral valve is normal in structure and function. No evidence of mitral valve regurgitation. No evidence of mitral stenosis.   4. The aortic valve is tricuspid. Aortic valve regurgitation is not visualized. Mild aortic valve sclerosis is present, with no evidence of aortic valve stenosis.   5. The inferior vena cava is normal in size with greater than 50% respiratory variability, suggesting right atrial pressure of 3 mmHg.   6. Technically difficult study with poor acoustic windows.    [place] EKG SITE RITE Result Date: 11/25/19 If Site Rite image not attached, placement could not be confirmed due to current cardiac rhythm.  ECG - ST rate 109 BPM. (See cardiology reading for complete details)   PHYSICAL EXAM  Temp:  [98.8 F (37.1 C)-99.7 F (37.6 C)] 98.9 F (37.2 C) (02/21 0400) Pulse Rate:  [69-160] 81 (02/21 0600) Resp:  [12-47] 20 (02/21 0600) BP: (91-169)/(54-107) 130/70 (02/21 0600) SpO2:  [91 %-100 %] 98 % (02/21 0600) FiO2 (%):  [40 %] 40 % (02/21 0338)  General - Well nourished, well developed, intubated on precedex.  Ophthalmologic - fundi not visualized due to noncooperation.  Cardiovascular - Regular rate and rhythm.  Neuro - intubated on precedex, eyes closed but easily open on voice, following simple commands on the right hand and foot. With eye opening, eyes right gaze preference, not cross midline, with right gaze sustained nystagmus noted but  amplitude smaller than yesterday. Blinking to visual threat on the right but not on the left, tracking on the right, PERRL. Corneal reflex present, gag and cough present. Breathing over the vent.  Facial symmetry not able to test due to ET tube.  Tongue protrusion not cooperative. RUE and RLE spontaneous movement, against gravity. On pain stimulation, LUE 0/5 and LLE slight withdraw. DTR 1+ and positive babinski on the left. Sensation, coordination and gait not tested.   ASSESSMENT/PLAN Ms. Paula Kim is a 64 y.o. female with history  of HLD found down presenting with L sided weakness and dysarthria.  Stroke:   Large R MCA infarct w/ petechial hemorrhage R parietal s/p decompressive hemicrani - source unclear, ? large vessel vasculopathy  CT head extensive hypodensity R MCA territory w/ infarct.  CTA head proximal R M1 occlusion. Posterior circulation atherosclerosis - L VA stenosis vs focal occlusion. B mid P2 PCA moderate to severe stenoses.  CTA neck mixed plaque bifurcations and proximal B ICAs. Possible ulcerated plaque R ICA bulb. Lobar dilation B ICA bulbs. L ICA tortuous. 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   CT perfusion positive for penumbra  MRI  R MCA infarct w/ R parietal petechial hemorrhage. Chronic L parietooccipital infarcts.   CT repeat 2/21 - stable 60mm MLS  LE venous doppler - negative for DVT bilaterally  2D Echo EF 60-65%. No source of embolus   Consider 30 day cardiac event monitoring as outpt  LDL 70  HgbA1c 5.0  Heparin 5000 units sq tid for VTE prophylaxis  aspirin 325 mg daily prior to admission, now on aspirin 325 mg daily.   Therapy recommendations:  pending   Disposition:  pending   Acute Respiratory Failure  Intubated  On vent  CCM onboard  Not candidate for extubation at this time  Cerebral Edema  S/p Right decompression hemicrani by [facility] on 11/06/2019  CT 2/21 stable 73mm  MLS  On Keppra for seizure prevention  On 3% saline -> off -> NS @ 40  Na 140->147->152->157->159->160->158  Sodium goal 150-160  Sodium monitoring every 6 hours  Hypotension  On low dose Levophed  Stable now . SBP goal < 110-180 . Taper off Levophed as able . Long-term BP goal normotensive  Hyperlipidemia  Home meds:  pravachol 40, fish oil, fenofibrate  LDL 70, goal < 70  On Crestor 20  Continue statin on discharge  Dysphagia . Secondary to stroke . NPO . on TF @ 40 . 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 F/u in 6 mos. 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   Repeat in 6 months  Other Stroke Risk Factors  Former Cigarette smoker, quit 13 yrs ago  Substance abuse - UDS:  THC POSITIVE, Will advise to stop using due to stroke risk.  Hx stroke/TIA  06/2009 - midbrain and L PCA territory infarcts. Small vessel disease.    Other Active Problems  Hx skew deviation since childhood, worsened post stroke  Hypokalemia 2.7-3.6->3.4->supplement  R thyroid nodule. Nonemergent [place] recommended  Depression on Effexor  Mild Leukocytosis - 11.1->14.8  Acute blood loss anemia - Hb - 13->7.6->8.2   Hospital day # 3  This patient is critically ill and at significant risk of neurological worsening, death and care requires constant monitoring of vital signs, hemodynamics,respiratory and cardiac monitoring, extensive review of multiple databases, frequent neurological assessment, discussion with family, other specialists and medical decision making of high complexity. I spent 35 minutes of neurocritical care time  in the care of  this patient. I had long discussion with Niece, who is the next of kin, at bedside, updated pt current condition, treatment plan and potential  prognosis, and answered all the questions. She expressed understanding and appreciation.   Marvel Plan, MD PhD Stroke Neurology 11/17/2019 7:20 PM  To contact Stroke Continuity provider, please refer to [email or web address]. After hours,  contact General Neurology

## 2019-11-17 NOTE — Progress Notes (Signed)
NAME:  Paula Kim, MRN:  [id number], DOB:  [birth year], LOS: 3 ADMISSION DATE:  11/01/2019, CONSULTATION DATE: 11/21/2019 REFERRING MD:  Roland Rack, CHIEF COMPLAINT:  hemiplegia   Brief History   This is a 64 year old female who was found hemiplegic on the morning of 2/18.  On imaging she already had a large right MCA territory infarct.  She underwent a decompressive craniectomy on the evening of 2/18.   History of present illness   Unfortunately we have very little history on this 31 year old.  A friend tried to call her on the morning of admission, as she usually does at 6 AM, and when she was unable to awaken the patient went to visit and found her unresponsive.  She was brought to our department of emergency medicine where a CT and CTA were performed.  She has a large right MCA infarct and an M1 occlusion.  She was not a candidate for either thrombolytics or thrombectomy and she was taken to the operating room for craniectomy on the evening of 2/18.    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 Past surgical history is remarkable for a laparoscopic cholecystectomy.  Significant Hospital Events   Admission 10/31/2019.  Decompressive craniectomy 2/18  Consults:  PCCM  Procedures:  Decompressive craniectomy 2/18  Significant Diagnostic Tests:  CT and CTA showing a large right MCA infarct and M1 occlusion  Micro Data:  None  Antimicrobials:  None   Interim history/subjective:  Overnight no issues. She is sedated but follows commands. Not consistently opening her eyes or maintaining alertness. On SBT this morning PS trial 10/5. Minimal secretions. Did PS trial all day yesterday.   Objective   Blood pressure 131/70, pulse 81, temperature 98.9 F (37.2 C), temperature source Oral, resp. rate 17, height 5\' 1"  (1.549 m), weight 67 kg, SpO2 100 %.      Vent Mode: PSV;CPAP FiO2 (%):  [40 %] 40 % Set Rate:  [15 bmp-16 bmp] 16 bmp Vt Set:  [380 mL] 380 mL PEEP:  [5 cmH20] 5 cmH20 Pressure Support:  [10 cmH20] 10 cmH20 Plateau Pressure:  [12 cmH20-16 cmH20] 15 cmH20   Intake/Output Summary (Last 24 hours) at 11/17/2019 0904 Last data filed at 11/17/2019 0600 Gross per 24 hour  Intake 1886.43 ml  Output 1500 ml  Net 386.43 ml   Filed Weights   11/17/2019 2034  Weight: 67 kg    Examination: General: The patient is intubated resting in bed and in no acute distress.     HENT:  Facial droop is present   Lungs: clear to auscultation bilaterally, no wheezes or crackles Cardiovascular: RRR, no mrg    Abdomen: soft, nontender, nondistended Extremities: The extremities are warm. Left upper extremity edema, good pulses throughout Neuro: left sided hemiparesis. Left babinski sign is upgoing. Follows commands on right side.   Assessment & Plan:  This is a 64 year old who is suffered from a large right MCA territory infarct and underwent decompressive craniotomy on 11/07/2019.  Acute Hypoxemic Respiratory Failure - intubated secondary to coma from stroke - She is tolerating a pressure support trial. I'd like to see her fly on PS 5/5 with more alertness later today. Potentially she could be extubated today pending her improved mental status.   - Needs to have a good cuff leak as well as there was concern about facial/oral edema.  - if we do  not end up extubating I would like to put her back on PRVC.   Circulatory Shock - on low dose norepinephrine. Will titrate down as tolerated - suspect from sedation, possibly some vasoplegia.   Acute Encephalopathy Suspect multifactorial from delirium and acute stroke.  Maintain low dose precedex Would like to see her maintain level of alertness enough to participate with PT.     The patient is critically ill with multiple organ systems failure and requires high complexity decision making for  assessment and support, frequent evaluation and titration of therapies, application of advanced monitoring technologies and extensive interpretation of multiple databases.   Critical Care Time devoted to patient care services described in this note is 36 minutes. This time reflects time of care of this signee Charlott Holler . This critical care time does not reflect separately billable procedures or procedure time, teaching time or supervisory time of PA/NP/Med student/Med Resident etc but could involve care discussion time.  Mickel Baas Pulmonary and Critical Care Medicine 11/17/2019 9:05 AM  Pager: [phone] After hours pager: [phone]   Best practice:  Diet: consider tube feeds if not extubating.  Pain/Anxiety/Delirium protocol (if indicated): precedex VAP protocol (if indicated): In place DVT prophylaxis: SCDs only due to impending neurosurgery GI prophylaxis: Pepcid Glucose control: None required Mobility: Bed rest Code Status: Full Family Communication: per primary Disposition: maintain ICU  Labs   CBC: Recent Labs  Lab 10/30/2019 1059 11/15/2019 1200 10/29/2019 2112 11/16/19 2020 11/17/19 0349  WBC  --  11.1*  --  11.4* 14.8*  NEUTROABS  --  9.5*  --   --   --   HGB 13.9 13.0 9.5* 7.6* 8.2*  HCT 41.0 38.0 28.0* 24.1* 26.9*  MCV  --  89.4  --  98.0 98.9  PLT  --  259  --  152 201    Basic Metabolic Panel: Recent Labs  Lab 11/02/2019 1030 11/16/2019 1030 11/19/2019 1059 11/04/2019 1650 10/28/2019 2112 11/04/2019 2112 11/15/19 0345 11/15/19 1407 11/15/19 1840 11/16/19 0048 11/16/19 0608 11/16/19 0915 11/16/19 1500 11/16/19 2227 11/17/19 0349  NA 140   < > 140   < > 140   < > 147*   < > 155*   < > 157* 160* 159* 159* 160*  K 3.2*   < > 3.0*  --  2.7*  --  3.6  --   --   --  3.5  --   --   --  3.4*  CL 102  --  104  --   --   --  116*  --   --   --  125*  --   --   --  126*  CO2 23  --   --   --   --   --  20*  --   --   --  25  --   --   --  24  GLUCOSE  127*  --  112*  --   --   --  204*  --   --   --  152*  --   --   --  141*  BUN 12  --  12  --   --   --  12  --   --   --  22  --   --   --  18  CREATININE 0.77  --  0.50  --   --   --  0.80  --   --   --  0.77  --   --   --  0.58  CALCIUM 9.5  --   --   --   --   --  7.9*  --   --   --  8.1*  --   --   --  8.3*  MG  --   --   --   --   --   --  2.0  --  2.3  --  2.4  --   --   --   --   PHOS  --   --   --   --   --   --   --   --  2.9  --  1.7*  --   --   --  3.0   < > = values in this interval not displayed.   GFR: Estimated Creatinine Clearance: 63.1 mL/min (by C-G formula based on SCr of 0.58 mg/dL). Recent Labs  Lab 12/04/19 1200 11/16/19 2020 11/17/19 0349  WBC 11.1* 11.4* 14.8*    Liver Function Tests: Recent Labs  Lab 12/04/2019 1030  AST 29  ALT 18  ALKPHOS 101  BILITOT 1.0  PROT 8.1  ALBUMIN 4.2   No results for input(s): LIPASE, AMYLASE in the last 168 hours. No results for input(s): AMMONIA in the last 168 hours.  ABG    Component Value Date/Time   PHART 7.374 12/04/2019 2112   PCO2ART 41.0 2019/12/04 2112   PO2ART 114.0 (H) 12/04/2019 2112   HCO3 23.9 12/04/19 2112   TCO2 25 2019/12/04 2112   ACIDBASEDEF 1.0 12-04-2019 2112   O2SAT 98.0 12-04-19 2112     Coagulation Profile: Recent Labs  Lab December 04, 2019 1030  INR 1.0    Cardiac Enzymes: No results for input(s): CKTOTAL, CKMB, CKMBINDEX, TROPONINI in the last 168 hours.  HbA1C: Hgb A1c MFr Bld  Date/Time Value Ref Range Status  11/15/2019 03:45 AM 5.0 4.8 - 5.6 % Final    Comment:    (NOTE) Pre diabetes:          5.7%-6.4% Diabetes:              >6.4% Glycemic control for   <7.0% adults with diabetes     CBG: Recent Labs  Lab 11/16/19 1521 11/16/19 1908 11/16/19 2310 11/17/19 0315 11/17/19 0718  GLUCAP 130* 128* 119* 136* 136*

## 2019-11-17 NOTE — Progress Notes (Signed)
No new issues or problems.  Patient showing some early responsiveness and progression towards consciousness.  She will follow some simple commands on her right side.  Remains plegic on the left.  Scalp flap soft.  Wound healing well.  No new recommendations from our standpoint.  Continue supportive efforts.

## 2019-11-18 ENCOUNTER — Inpatient Hospital Stay (HOSPITAL_COMMUNITY): Payer: Self-pay

## 2019-11-18 ENCOUNTER — Encounter: Payer: Self-pay | Admitting: *Deleted

## 2019-11-18 DIAGNOSIS — J9601 Acute respiratory failure with hypoxia: Secondary | ICD-10-CM

## 2019-11-18 DIAGNOSIS — Z4659 Encounter for fitting and adjustment of other gastrointestinal appliance and device: Secondary | ICD-10-CM

## 2019-11-18 DIAGNOSIS — D62 Acute posthemorrhagic anemia: Secondary | ICD-10-CM

## 2019-11-18 DIAGNOSIS — R509 Fever, unspecified: Secondary | ICD-10-CM

## 2019-11-18 LAB — URINALYSIS, COMPLETE (UACMP) WITH MICROSCOPIC
Bacteria, UA: NONE SEEN
Bilirubin Urine: NEGATIVE
Glucose, UA: NEGATIVE mg/dL
Hgb urine dipstick: NEGATIVE
Ketones, ur: NEGATIVE mg/dL
Leukocytes,Ua: NEGATIVE
Nitrite: NEGATIVE
Protein, ur: NEGATIVE mg/dL
Specific Gravity, Urine: 1.018 (ref 1.005–1.030)
pH: 7 (ref 5.0–8.0)

## 2019-11-18 LAB — GLUCOSE, CAPILLARY
Glucose-Capillary: 111 mg/dL — ABNORMAL HIGH (ref 70–99)
Glucose-Capillary: 113 mg/dL — ABNORMAL HIGH (ref 70–99)
Glucose-Capillary: 127 mg/dL — ABNORMAL HIGH (ref 70–99)
Glucose-Capillary: 131 mg/dL — ABNORMAL HIGH (ref 70–99)
Glucose-Capillary: 138 mg/dL — ABNORMAL HIGH (ref 70–99)
Glucose-Capillary: 93 mg/dL (ref 70–99)

## 2019-11-18 LAB — BASIC METABOLIC PANEL
Anion gap: 9 (ref 5–15)
BUN: 25 mg/dL — ABNORMAL HIGH (ref 8–23)
CO2: 26 mmol/L (ref 22–32)
Calcium: 7.9 mg/dL — ABNORMAL LOW (ref 8.9–10.3)
Chloride: 123 mmol/L — ABNORMAL HIGH (ref 98–111)
Creatinine, Ser: 0.7 mg/dL (ref 0.44–1.00)
GFR calc Af Amer: >60 mL/min (ref >60)
GFR calc non Af Amer: >60 mL/min (ref >60)
Glucose, Bld: 106 mg/dL — ABNORMAL HIGH (ref 70–99)
Potassium: 3.8 mmol/L (ref 3.5–5.1)
Sodium: 158 mmol/L — ABNORMAL HIGH (ref 135–145)

## 2019-11-18 LAB — SODIUM
Sodium: 160 mmol/L — ABNORMAL HIGH (ref 135–145)
Sodium: 160 mmol/L — ABNORMAL HIGH (ref 135–145)
Sodium: 154 mmol/L — ABNORMAL HIGH (ref 135–145)

## 2019-11-18 LAB — CBC
HCT: 23.5 % — ABNORMAL LOW (ref 36.0–46.0)
Hemoglobin: 7.1 g/dL — ABNORMAL LOW (ref 12.0–15.0)
MCH: 29.8 pg (ref 26.0–34.0)
MCHC: 30.2 g/dL (ref 30.0–36.0)
MCV: 98.7 fL (ref 80.0–100.0)
Platelets: 172 10*3/uL (ref 150–400)
RBC: 2.38 MIL/uL — ABNORMAL LOW (ref 3.87–5.11)
RDW: 13.5 % (ref 11.5–15.5)
WBC: 7.6 10*3/uL (ref 4.0–10.5)
nRBC: 0.3 % — ABNORMAL HIGH (ref 0.0–0.2)

## 2019-11-18 MED ORDER — MIDAZOLAM HCL 2 MG/2ML IJ SOLN
1.0000 mg | INTRAMUSCULAR | Status: DC | PRN
  Administered 2019-11-18 – 2019-11-22 (×6): 1 mg via INTRAVENOUS
  Filled 2019-11-18 (×9): qty 2

## 2019-11-18 MED ORDER — SENNOSIDES-DOCUSATE SODIUM 8.6-50 MG PO TABS
1.0000 | ORAL_TABLET | Freq: Every day | ORAL | Status: DC
  Administered 2019-11-18 – 2019-11-19 (×2): 1 via ORAL
  Filled 2019-11-18 (×3): qty 1

## 2019-11-18 MED ORDER — FENTANYL 2500MCG IN NS 250ML (10MCG/ML) PREMIX INFUSION
50.0000 ug/h | INTRAVENOUS | Status: DC
  Administered 2019-11-18: 100 ug/h via INTRAVENOUS
  Administered 2019-11-20 – 2019-11-24 (×3): 50 ug/h via INTRAVENOUS
  Filled 2019-11-18 (×5): qty 250

## 2019-11-18 MED ORDER — LEVETIRACETAM 100 MG/ML PO SOLN
1000.0000 mg | Freq: Two times a day (BID) | ORAL | Status: DC
  Administered 2019-11-18 – 2019-11-25 (×14): 1000 mg
  Filled 2019-11-18 (×14): qty 10

## 2019-11-18 MED ORDER — SODIUM CHLORIDE 0.45 % IV SOLN: INTRAVENOUS | Status: DC

## 2019-11-18 MED ORDER — BISACODYL 10 MG RE SUPP
10.0000 mg | Freq: Once | RECTAL | Status: AC
  Administered 2019-11-18: 10 mg via RECTAL
  Filled 2019-11-18: qty 1

## 2019-11-18 NOTE — Progress Notes (Signed)
MD Roda Shutters with neurology made aware of increase in temperature from 100.2 to 101.73F despite tylenol given. More tylenol given and ice packs placed. New orders placed by MD Xu. All other vital signs within normal limits. Will continue to monitor.

## 2019-11-18 NOTE — Progress Notes (Addendum)
STROKE TEAM PROGRESS NOTE   INTERVAL HISTORY RN at bedside. Pt initially quite with eyes closed on precedex off fentanyl, vital stable except Temp 100.2. however, with exam and painful stimulation, pt eyes open, barely following limited commands, however, became agitated, flail on the right UE and LE, tachycardia and tachypnea, desynchronized with vent. Had to give fentanyl bolus. EEG showed left frontal sharp waves, no seizure. She is on keppra, will increase dose.  Vitals:   11/18/19 0715 11/18/19 0730 11/18/19 0735 11/18/19 0800  BP: 117/65 (!) 149/68 (!) 149/68 (!) 143/79  Pulse: 81 (!) 119  (!) 115  Resp: 15 (!) 25  (!) 22  Temp:   98.5 F (36.9 C)   TempSrc:      SpO2: 100% 98% 99% 98%  Weight:      Height:        CBC:  Recent Labs  Lab 10/28/2019 1200 11/09/2019 2112 11/17/19 0349 11/18/19 0306  WBC 11.1*   < > 14.8* 7.6  NEUTROABS 9.5*  --   --   --   HGB 13.0   < > 8.2* 7.1*  HCT 38.0   < > 26.9* 23.5*  MCV 89.4   < > 98.9 98.7  PLT 259   < > 201 172   < > = values in this interval not displayed.    Basic Metabolic Panel:  Recent Labs  Lab 11/15/19 1840 11/16/19 0048 11/16/19 1856 11/16/19 0915 11/17/19 0349 11/17/19 0950 11/17/19 2042 11/18/19 0306  NA 155*   < > 157*   < > 160*   < > 157* 158*  K  --   --  3.5   < > 3.4*  --   --  3.8  CL  --   --  125*   < > 126*  --   --  123*  CO2  --   --  25   < > 24  --   --  26  GLUCOSE  --   --  152*   < > 141*  --   --  106*  BUN  --   --  22   < > 18  --   --  25*  CREATININE  --   --  0.77   < > 0.58  --   --  0.70  CALCIUM  --   --  8.1*   < > 8.3*  --   --  7.9*  MG 2.3  --  2.4  --   --   --   --   --   PHOS 2.9  --  1.7*  --  3.0  --   --   --    < > = values in this interval not displayed.   Lipid Panel:     Component Value Date/Time   CHOL 125 11/15/2019 0345   TRIG 151 (H) 11/15/2019 0345   HDL 25 (L) 11/15/2019 0345   CHOLHDL 5.0 11/15/2019 0345   VLDL 30 11/15/2019 0345   LDLCALC 70  11/15/2019 0345   HgbA1c:  Lab Results  Component Value Date   HGBA1C 5.0 11/15/2019   Urine Drug Screen:     Component Value Date/Time   LABOPIA NONE DETECTED 11/04/2019 1315   COCAINSCRNUR NONE DETECTED 10/31/2019 1315   LABBENZ NONE DETECTED 11/17/2019 1315   AMPHETMU NONE DETECTED 11/07/2019 1315   THCU POSITIVE (A) 11/19/2019 1315   LABBARB NONE DETECTED 11/22/2019 1315    Alcohol Level  Component Value Date/Time   ETH <10 November 15, 2019 1030    IMAGING  CT Code Stroke CTA Head W/WO contrast November 15, 2019   ADDENDUM: The presence of a thoracic aortic aneurysm, lobular dilation of both carotid bulbs and 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 Addendum called by telephone on February 19, 2021at 6:06 pmto provider Dr. Wilford Corner, Who verbally acknowledged these results. Electronically Signed   By: Jackey Loge DO   On: 11/15/2019 18:07  IMPRESSION:   CT head:  1. Extensive abnormal hypodensity throughout much of the right MCA vascular territory consistent with acute/early subacute ischemic infarction. No evidence of hemorrhagic conversion. No significant mass effect at this time.  2. Background chronic ischemic changes as detailed  3. Mild generalized parenchymal atrophy.  4. Air-fluid level and frothy secretions within the left maxillary sinus. Correlate for acute sinusitis.   CTA neck:  1. The bilateral common carotid, internal carotid and vertebral arteries are patent within the neck without significant stenosis (50% or greater).  2. Mixed plaque within the carotid bifurcations and proximal internal carotid arteries bilaterally. There is possible ulcerated plaque within the right carotid bulb. There is lobular dilation of the bilateral carotid bulbs. Additionally, there is significant tortuosity of the proximal left ICA.  3. 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 Please  correlate with findings on concurrently performed CTA chest/abdomen/pelvis.  4. 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  5. 1.8 cm nodule within the right thyroid lobe. Nonemergent dedicated thyroid ultrasound is recommended for further evaluation, as clinically appropriate.   CTA head:  1. 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 No significant reconstitution of the right M1 MCA more distally. There is minimal reconstitution of right M2 and more distal right MCA branches.  2. Posterior circulation atherosclerotic stenoses. Most notably, there is focal near occlusive stenosis versus focal occlusion of the distal left vertebral artery just proximal to the vertebrobasilar junction. Moderate to moderately severe stenoses within the mid P2 posterior cerebral arteries bilaterally.  3. Mild atherosclerotic plaque within the intracranial internal carotid arteries.  CT perfusion head: The perfusion software identifies a 122 mL core infarct within the right MCA vascular territory. The perfusion software identifies a 139 mL region of critically hypoperfused parenchyma within the right MCA vascular territory. Reported mismatch volume 17 mL.   DG Chest 2 View 15-Nov-2019 IMPRESSION:  No active disease.  Aortic atherosclerosis, tortuosity and ectasia.   CT HEAD WO CONTRAST 11/15/2019 IMPRESSION:  1. Large right MCA infarct with no hemorrhagic transformation.  2. Status post right hemi-craniectomy with mild intracranial mass effect. 5 mm of leftward midline shift. Patent basilar cisterns.  3. Underlying chronic small vessel disease.  MR BRAIN WO CONTRAST 11-15-19 IMPRESSION:  1. Motion degraded exam.  2. Acute ischemic infarction involving much of the right MCA vascular territory. Only mild mass effect at this time. Mild petechial hemorrhage within portions of the right parietal infarction territory.  3. Redemonstrated small chronic cortically based  infarcts within the left parietooccipital lobes.  4. Moderate/advanced chronic small vessel ischemic disease with multiple chronic lacunar infarcts, progressed from MRI 07/08/2009.  5. Mild generalized parenchymal atrophy.  6. Air-fluid level and frothy secretions within the left maxillary sinus. Correlate for acute sinusitis.  CT Angio Chest/Abd/Pel for Dissection W and/or W/WO 2019/11/15 IMPRESSION:  1. 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 No dissection or  intramural hematoma.  2. 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 Recommend followup by abdomen and pelvis CTA in 6 months, and vascular surgery referral/consultation if not already obtained. This recommendation follows ACR consensus guidelines: White Paper of the ACR Incidental Findings Committee II on Vascular Findings. J Am Coll Radiol 2013; 10:789-794. Aortic aneurysm NOS (ICD10-I71.9)  3. 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  4. Cirrhotic changes involving the liver. No worrisome hepatic lesions.  5. Emphysematous changes and pulmonary scarring but no acute pulmonary findings.  6. 17 mm right thyroid nodule. Recommend thyroid US and potential biopsy (ref: J Am Coll Radiol. 2015 Feb;12(2): 143-50).  7. Aortic Atherosclerosis (ICD10-I70.0) and Emphysema (ICD10-J43.9).   CT C-SPINE NO CHARGE 11/03/2019 IMPRESSION:  1. No acute fracture or dislocation of the cervical spine.  2. Multilevel degenerative disc disease and facet arthropathy.  3. Thoracic aortic aneurysm. Please refer to forthcoming CT angiogram report for full evaluation of the vascular findings.  4. 1.8 cm right thyroid lobe nodule. Dedicated thyroid ultrasound is recommended on a nonemergent basis.  5. Centrilobular emphysema. Emphysema (ICD10-J43.9).   DG Chest Port 1  View 11/08/2019 IMPRESSION:  1.  Lungs clear.  2. Prominence of the descending aorta with questionable degree of aneurysmal dilatation distally.   DG Abd Portable 1V 11/15/2019 IMPRESSION:  Enteric tube in the proximal stomach.  DG Abd Portable 1V 11/15/2019 IMPRESSION:  NG tube noted with its tip over the upper stomach. Side hole is at the gastroesophageal junction. Advancement of the NG tube by approximately 10 cm should be considered.    ECHOCARDIOGRAM COMPLETE 11/16/2019 IMPRESSIONS   1. Left ventricular ejection fraction, by estimation, is 60 to 65%. The left ventricle has normal function. The left ventricle has no regional wall motion abnormalities. Indeterminate diastolic filling due to E-A fusion.   2. Right ventricular systolic function is normal. The right ventricular size is normal. Tricuspid regurgitation signal is inadequate for assessing PA pressure.   3. The mitral valve is normal in structure and function. No evidence of mitral valve regurgitation. No evidence of mitral stenosis.   4. The aortic valve is tricuspid. Aortic valve regurgitation is not visualized. Mild aortic valve sclerosis is present, with no evidence of aortic valve stenosis.   5. The inferior vena cava is normal in size with greater than 50% respiratory variability, suggesting right atrial pressure of 3 mmHg.   6. Technically difficult study with poor acoustic windows.   [place] EKG SITE RITE Result Date: 11/06/2019 If Site Rite image not attached, placement could not be confirmed due to current cardiac rhythm.  ECG - ST rate 109 BPM. (See cardiology reading for complete details)   PHYSICAL EXAM  Temp:  [98.5 F (36.9 C)-100.2 F (37.9 C)] 98.5 F (36.9 C) (02/22 0735) Pulse Rate:  [76-137] 115 (02/22 0800) Resp:  [13-38] 22 (02/22 0800) BP: (76-149)/(50-92) 143/79 (02/22 0800) SpO2:  [94 %-100 %] 98 % (02/22 0800) FiO2 (%):  [40 %] 40 % (02/22 0735)  General - Well nourished, well developed,  intubated on precedex.  Ophthalmologic - fundi not visualized due to noncooperation.  Cardiovascular - Regular rate and rhythm.  Neuro - intubated on precedex, eyes closed but open with painful stimulation, barely following limited simple commands on the right hand and foot. With eye opening, eyes right gaze preference, not cross midline, no significant nystagmus seen today. Inconsistent blinking to  visual threat, PERRL. Corneal reflex present, gag and cough present. Breathing over the vent.  Facial symmetry not able to test due to ET tube.  Tongue protrusion not cooperative. RUE and RLE spontaneous movement, against gravity. On pain stimulation, LUE 0/5 and LLE slight withdraw. DTR 1+ and positive babinski on the left. increased muscle tone on the LUE. Sensation, coordination and gait not tested.   ASSESSMENT/PLAN Ms. Paula Kim is a 64 y.o. female with history of HLD found down presenting with L sided weakness and dysarthria.  Stroke:   Large R MCA infarct w/ petechial hemorrhage R parietal s/p decompressive hemicrani - source unclear, ? large vessel vasculopathy  CT head extensive hypodensity R MCA territory w/ infarct.  CTA head proximal R M1 occlusion. Posterior circulation atherosclerosis - L VA stenosis vs focal occlusion. B mid P2 PCA moderate to severe stenoses.  CTA neck mixed plaque bifurcations and proximal B ICAs. Possible ulcerated plaque R ICA bulb. Lobar dilation B ICA bulbs. L ICA tortuous. 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   CT perfusion positive for penumbra  MRI  R MCA infarct w/ R parietal petechial hemorrhage. Chronic L parietooccipital infarcts.   CT repeat 2/21 - stable 72mm MLS  LE venous doppler - negative for DVT bilaterally  2D Echo EF 60-65%. No source of embolus   Consider 30 day cardiac event monitoring as outpt  LDL 70  HgbA1c 5.0  Heparin 5000 units sq tid for VTE prophylaxis  aspirin  325 mg daily prior to admission, now on aspirin 325 mg daily.   Therapy recommendations:  pending   Disposition:  pending   Acute Respiratory Failure  Intubated  On vent  Sedated with fentanyl and precedex  CCM onboard  Not candidate for extubation at this time  Cerebral Edema  S/p Right decompression hemicrani by [place] on 11-20-19  CT 2/21 stable 78mm MLS  On Keppra for seizure prevention  On 3% saline -> off -> NS @ 40 -> 1/2 NS @ 40  Na 140->147->152->157->159->160->158->157->158  Sodium goal 150-160  Sodium monitoring every 6 hours  ? Seizure activity  Stimulation induced increased muscle tone at LUE and tonic posturing of RUE and RLE.   EEG left frontal sharp waves  On keppra 500 -> increase to 1000 bid  Consider LTM if recurrent  Fever   Tmax 101.2  On ice pack and cooling blanket  Mild Leukocytosis - 11.1->14.8->7.6  Blood culture and urine culture pending  UA pending  CXR pending  Surgical wound clear dry, no pus  May consider empiric Abx if needed  Acute blood loss anemia   Hb - 13->7.6->8.2->7.1   Stool guaiac pending  Close monitoring CBC  Iron panel pending  Consider transfusion if Hb < 7.0  Hypotension  On low dose Levophed  Stable now . SBP goal 110-180 . Taper off Levophed as able . Long-term BP goal normotensive  Hyperlipidemia  Home meds:  pravachol 40, fish oil, fenofibrate  LDL 70, goal < 70  On Crestor 20  Continue statin on discharge  Dysphagia . Secondary to stroke . NPO . on TF @ 40 . IVF @ 40 . 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 F/u in 6 mos. 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   Repeat in 6 months  Other Stroke Risk Factors  Former Cigarette smoker, quit 13 yrs ago  Substance  abuse - UDS:  THC POSITIVE, Will advise to stop using due to stroke risk.  Hx stroke/TIA  06/2009 - midbrain and L PCA territory infarcts. Small vessel disease.    Other Active Problems  Hx skew deviation since childhood, worsened post stroke  Hypokalemia 2.7-3.6->3.4->3.8  R thyroid nodule. Nonemergent [place] recommended  Depression on Effexor  Hospital day # 4  This patient is critically ill and at significant risk of neurological worsening, death and care requires constant monitoring of vital signs, hemodynamics,respiratory and cardiac monitoring, extensive review of multiple databases, frequent neurological assessment, discussion with family, other specialists and medical decision making of high complexity. I spent 40 minutes of neurocritical care time  in the care of  this patient.  I discussed with Dr. Delton Coombes CCM.  Marvel Plan, MD PhD Stroke Neurology 11/18/2019 8:20 AM  To contact Stroke Continuity provider, please refer to [email or web address]. After hours, contact General Neurology

## 2019-11-18 NOTE — Progress Notes (Signed)
OT Cancellation Note  Patient Details Name: Rosibel Giacobbe MRN: [id number] DOB: Nov 07, 1955   Cancelled Treatment:    Reason Eval/Treat Not Completed: Active bedrest order pt attempting to wean and RN requesting to hold in addition to BR at this time.  Wynona Neat, OTR/L  Acute Rehabilitation Services Pager: [phone] Office: [phone] .  11/18/2019, 8:33 AM

## 2019-11-18 NOTE — Progress Notes (Signed)
eLink Physician-Brief Progress Note Patient Name: Paula Kim DOB: Jun 12, 1956 MRN: [id number]   Date of Service  11/18/2019  HPI/Events of Note  Patient continues to have these episodes of tachypnea and tachycardia transiently resolves with Fentanyl pushes. Midazolam push was not effective.  eICU Interventions  Ordered to start Fentanyl drip Sedation holiday as tolerated in am     Intervention Category Minor Interventions: Agitation / anxiety - evaluation and management  Rosalie Gums Dacoda Finlay 11/18/2019, 1:04 AM

## 2019-11-18 NOTE — Progress Notes (Signed)
eLink Physician-Brief Progress Note Patient Name: Paula Kim DOB: [birth year] MRN: [id number]   Date of Service  11/18/2019  HPI/Events of Note  Notified of episodes of agitation despite Precedex and prn Fentanyl  eICU Interventions  Ordered midazolam prn     Intervention Category Minor Interventions: Agitation / anxiety - evaluation and management  Darl Pikes 11/18/2019, 12:24 AM

## 2019-11-18 NOTE — Procedures (Signed)
Patient Name: Paula Kim  MRN: [id number]  Epilepsy Attending: Charlsie Quest  Referring Physician/Provider: Dr Marvel Plan Date: 11/18/2019 Duration: 24.08 mins  Patient history: 63yo F with acute large right MCA infarct. EEG to evaluate for seizures.   Level of alertness: lethargic  AEDs during EEG study: LEV  Technical aspects: This EEG study was done with scalp electrodes positioned according to the 10-20 International system of electrode placement. Electrical activity was acquired at a sampling rate of 500Hz  and reviewed with a high frequency filter of 70Hz  and a low frequency filter of 1Hz . EEG data were recorded continuously and digitally stored.   DESCRIPTION: No clear posterior dominant rhythm was seen. EEG showed continuous generalized 3-6hz  theta-delta slowing. Sharp waves were seen frequently in left anterior temporal region, maxima F7/T7. EEG is technically difficult due to significant edema and sutures on right side making electrode placement difficult and subsequent artifact. Hyperventilation and photic stimulation were not performed.  ABNORMALITY - Continuous slow, generalized - Sharp waves, left anterior temporal region  IMPRESSION: This technically difficult study showed evidence of epileptogenicity in left anterior temporal region as well as moderate to severe diffuse encephalopathy, non specific to etiology.  No seizures  were seen throughout the recording.  If concern for ictal and interictal activity persists, please consider repeat prolonged study.   Mareon Robinette 

## 2019-11-18 NOTE — Progress Notes (Signed)
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  On imaging she already had a large right MCA territory infarct.  She underwent a decompressive craniectomy on the evening of 2/18.   History of present illness   Unfortunately we have very little history on this 85 year old.  A friend tried to call her on the morning of admission, as she usually does at 6 AM, and when she was unable to awaken the patient went to visit and found her unresponsive.  She was brought to our department of emergency medicine where a CT and CTA were performed.  She has a large right MCA infarct and an M1 occlusion.  She was not a candidate for either thrombolytics or thrombectomy and she was taken to the operating room for craniectomy on the evening of 2/18.    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 Past surgical history is remarkable for a laparoscopic cholecystectomy.  Significant Hospital Events   Admission 11/29/19.  Decompressive craniectomy 2/18  Consults:  Neurosurgery   Procedures:  Decompressive craniectomy 2/18  Significant Diagnostic Tests:  CT and CTA head 2/19 >> showing a large right MCA infarct and M1 occlusion  CT head 2/21 >> 1. Stable no new intracranial abnormality.  Micro Data:  COVID 2/18 > Negative MRSA PCR 2/18 > Negative  Antimicrobials:  None   Interim history/subjective:  RN reports increased agitation overnight resolved with addition of fentanyl drip. Currently on SBT trial, fentanyl drip currently on hold. 2L positive since admission   Objective   Blood pressure (!) 149/68, pulse (!) 119, temperature 99.7 F (37.6  C), temperature source Axillary, resp. rate (!) 25, height 5\' 1"  (1.549 m), weight 67 kg, SpO2 99 %.    Vent Mode: PSV;CPAP FiO2 (%):  [40 %] 40 % Set Rate:  [16 bmp] 16 bmp Vt Set:  [380 mL] 380 mL PEEP:  [5 cmH20] 5 cmH20 Pressure Support:  [5 cmH20] 5 cmH20 Plateau Pressure:  [10 cmH20-16 cmH20] 13 cmH20   Intake/Output Summary (Last 24 hours) at 11/18/2019 0759 Last data filed at 11/18/2019 0543 Gross per 24 hour  Intake 1355.7 ml  Output 900 ml  Net 455.7 ml   Filed Weights   2019/11/29 2034  Weight: 67 kg    Examination: General: Chronically ill appearing elderly female on mechanical ventilation, in NAD HEENT: ETT, MM pink/moist, PERRL, right craniectomy incision clean dry and intact  Neuro: Left sided hemiparesis, left upward babinski, able to follow simple commands right, still slightly sedated from fentanyl drip  CV: s1s2 regular rate and rhythm, no murmur, rubs, or gallops,  PULM:  Clear to ascultation bilaterally, currently tolerating SBT, no increased work of breathing  GI: soft, bowel sounds active in all 4 quadrants, non-tender, non-distended, tolerating TF Extremities: warm/dry, no edema  Skin: no rashes or lesions  Assessment & Plan:  This is a 64 year old who is suffered from a large right MCA territory infarct and underwent decompressive craniotomy on Nov 29, 2019.  Acute Hypoxemic Respiratory Failure -Due to large right MCA infarct resulting in coma  P: Continue to trail SBT with goal of 5/5, mentation preclude extubation  Minimize sedation as  able  Will need to check for cuff leak prior to ectubation  Wean PEEP and FiO2 for sats greater than 90%. Head of bed elevated 30 degrees. Plateau pressures less than 30 cm H20.  Follow intermittent chest x-ray and ABG.   Ensure adequate pulmonary hygiene  VAP bundle in place  PAD protocol  Large R MCA infarct w/ petechial hemorrhage R parietal  Cerebral edema  -s/p decompressive hemicrani 11-21-19 - source  unclear P: Management per neurology  Maintain neuro protective measures; goal for eurothermia, euglycemia, eunatermia, normoxia, and PCO2 goal of 35-40 Nutrition and bowel regiment  Seizure precautions  AEDs per neurology  Hypertonic saline discontinued   Circulatory Shock - suspect from sedation, possibly some vasoplegia.  P: Blood pressure remains stable on low dose Norepinephrine BP goal 110-180 per neurology  Monitor in the ICU setting  Wean as able    Acute metabolic encephalopathy Suspect multifactorial from delirium and acute stroke.  P: Minimize as able  Delirium precautions  PT/OT/SLP once extubated    At risk malnutrition due to inadequate oral intake P: Tube feeds per protocol  Protein supplementation    Best practice:  Diet: consider tube feeds Pain/Anxiety/Delirium protocol (if indicated): precedex and fentanyl  VAP protocol (if indicated): In place DVT prophylaxis: SCDs only due bleeding risk  GI prophylaxis: Pepcid Glucose control: None required Mobility: Bed rest Code Status: Full Family Communication: per primary Disposition: maintain ICU  Labs   CBC: Recent Labs  Lab 11/21/19 1200 21-Nov-2019 2112 11/16/19 2020 11/17/19 0349 11/18/19 0306  WBC 11.1*  --  11.4* 14.8* 7.6  NEUTROABS 9.5*  --   --   --   --   HGB 13.0 9.5* 7.6* 8.2* 7.1*  HCT 38.0 28.0* 24.1* 26.9* 23.5*  MCV 89.4  --  98.0 98.9 98.7  PLT 259  --  152 201 172    Basic Metabolic Panel: Recent Labs  Lab 21-Nov-2019 1030 November 21, 2019 1030 2019-11-21 1059 11/21/19 1650 11/21/19 2112 2019-11-21 2112 11/15/19 0345 11/15/19 1407 11/15/19 1840 11/16/19 0048 11/16/19 0608 11/16/19 0915 11/17/19 0349 11/17/19 0950 11/17/19 1620 11/17/19 2042 11/18/19 0306  NA 140   < > 140   < > 140   < > 147*   < > 155*   < > 157*   < > 160* 158* 158* 157* 158*  K 3.2*   < > 3.0*  --  2.7*  --  3.6  --   --   --  3.5  --  3.4*  --   --   --  3.8  CL 102   < > 104  --   --   --  116*  --   --    --  125*  --  126*  --   --   --  123*  CO2 23  --   --   --   --   --  20*  --   --   --  25  --  24  --   --   --  26  GLUCOSE 127*   < > 112*  --   --   --  204*  --   --   --  152*  --  141*  --   --   --  106*  BUN 12   < > 12  --   --   --  12  --   --   --  22  --  18  --   --   --  25*  CREATININE 0.77   < > 0.50  --   --   --  0.80  --   --   --  0.77  --  0.58  --   --   --  0.70  CALCIUM 9.5  --   --   --   --   --  7.9*  --   --   --  8.1*  --  8.3*  --   --   --  7.9*  MG  --   --   --   --   --   --  2.0  --  2.3  --  2.4  --   --   --   --   --   --   PHOS  --   --   --   --   --   --   --   --  2.9  --  1.7*  --  3.0  --   --   --   --    < > = values in this interval not displayed.   GFR: Estimated Creatinine Clearance: 63.1 mL/min (by C-G formula based on SCr of 0.7 mg/dL). Recent Labs  Lab November 21, 2019 1200 11/16/19 2020 11/17/19 0349 11/18/19 0306  WBC 11.1* 11.4* 14.8* 7.6    Liver Function Tests: Recent Labs  Lab 11-21-19 1030  AST 29  ALT 18  ALKPHOS 101  BILITOT 1.0  PROT 8.1  ALBUMIN 4.2   No results for input(s): LIPASE, AMYLASE in the last 168 hours. No results for input(s): AMMONIA in the last 168 hours.  ABG    Component Value Date/Time   PHART 7.374 Nov 21, 2019 2112   PCO2ART 41.0 11-21-2019 2112   PO2ART 114.0 (H) 11-21-19 2112   HCO3 23.9 November 21, 2019 2112   TCO2 25 11/21/19 2112   ACIDBASEDEF 1.0 2019-11-21 2112   O2SAT 98.0 11-21-2019 2112     Coagulation Profile: Recent Labs  Lab 21-Nov-2019 1030  INR 1.0    Cardiac Enzymes: No results for input(s): CKTOTAL, CKMB, CKMBINDEX, TROPONINI in the last 168 hours.  HbA1C: Hgb A1c MFr Bld  Date/Time Value Ref Range Status  11/15/2019 03:45 AM 5.0 4.8 - 5.6 % Final    Comment:    (NOTE) Pre diabetes:          5.7%-6.4% Diabetes:              >6.4% Glycemic control for   <7.0% adults with diabetes     CBG: Recent Labs  Lab 11/17/19 1240 11/17/19 1513 11/17/19 1925  11/17/19 2311 11/18/19 0308  GLUCAP 112* 124* 107* 141* 93   CRITICAL CARE Performed by: Delfin Gant  Total critical care time: 40 minutes  Critical care time was exclusive of separately billable procedures and treating other patients.  Critical care was necessary to treat or prevent imminent or life-threatening deterioration.  Critical care was time spent personally by me on the following activities: development of treatment plan with patient and/or surrogate as well as nursing, discussions with consultants, evaluation of patient's response to treatment, examination of patient, obtaining history from patient or surrogate, ordering and performing treatments and interventions, ordering and review of laboratory studies, ordering and review of radiographic studies, pulse oximetry and re-evaluation of patient's condition.  Delfin Gant, NP-C [facility] Contact / Pager information can be found on Amion  11/18/2019, 8:22 AM

## 2019-11-18 NOTE — Plan of Care (Signed)
  Problem: Clinical Measurements: Goal: Respiratory complications will improve Outcome: Progressing Goal: Cardiovascular complication will be avoided Outcome: Progressing   Problem: Nutrition: Goal: Adequate nutrition will be maintained Outcome: Progressing   Problem: Elimination: Goal: Will not experience complications related to urinary retention Outcome: Progressing   Problem: Safety: Goal: Ability to remain free from injury will improve Outcome: Progressing   Problem: Nutrition: Goal: Dietary intake will improve Outcome: Progressing

## 2019-11-18 NOTE — Progress Notes (Signed)
EEG complete - results pending 

## 2019-11-18 NOTE — Progress Notes (Signed)
SLP Cancellation Note  Patient Details Name: Paula Kim MRN: [id number] DOB: [birth year]   Cancelled treatment:       Reason Eval/Treat Not Completed: Patient not medically ready (on vent).    Mahala Menghini., M.A. CCC-SLP Acute Rehabilitation Services Pager [phone] Office [phone]  11/18/2019, 7:21 AM

## 2019-11-18 NOTE — Progress Notes (Signed)
PT Cancellation Note  Patient Details Name: Paula Kim MRN: [id number] DOB: May 09, 1956   Cancelled Treatment:    Reason Eval/Treat Not Completed: Active bedrest order. Pt remains on strict bed rest. Attempted to wean her off vent and sedation. PT to return as able as appropriate once activity orders advanced.  Lewis Shock, PT, DPT Acute Rehabilitation Services Pager #: [phone] Office #: [phone]    Iona Hansen 11/18/2019, 8:58 AM

## 2019-11-19 ENCOUNTER — Inpatient Hospital Stay (HOSPITAL_COMMUNITY): Payer: Self-pay

## 2019-11-19 LAB — BASIC METABOLIC PANEL
Anion gap: 8 (ref 5–15)
Anion gap: 5 (ref 5–15)
BUN: 24 mg/dL — ABNORMAL HIGH (ref 8–23)
BUN: 22 mg/dL (ref 8–23)
CO2: 27 mmol/L (ref 22–32)
CO2: 26 mmol/L (ref 22–32)
Calcium: 8.2 mg/dL — ABNORMAL LOW (ref 8.9–10.3)
Calcium: 7.9 mg/dL — ABNORMAL LOW (ref 8.9–10.3)
Chloride: 119 mmol/L — ABNORMAL HIGH (ref 98–111)
Chloride: 121 mmol/L — ABNORMAL HIGH (ref 98–111)
Creatinine, Ser: 0.57 mg/dL (ref 0.44–1.00)
Creatinine, Ser: 0.62 mg/dL (ref 0.44–1.00)
GFR calc Af Amer: >60 mL/min (ref >60)
GFR calc Af Amer: >60 mL/min (ref >60)
GFR calc non Af Amer: >60 mL/min (ref >60)
GFR calc non Af Amer: >60 mL/min (ref >60)
Glucose, Bld: 162 mg/dL — ABNORMAL HIGH (ref 70–99)
Glucose, Bld: 145 mg/dL — ABNORMAL HIGH (ref 70–99)
Potassium: 2.8 mmol/L — ABNORMAL LOW (ref 3.5–5.1)
Potassium: 3.3 mmol/L — ABNORMAL LOW (ref 3.5–5.1)
Sodium: 154 mmol/L — ABNORMAL HIGH (ref 135–145)
Sodium: 152 mmol/L — ABNORMAL HIGH (ref 135–145)

## 2019-11-19 LAB — CBC WITH DIFFERENTIAL/PLATELET
Abs Immature Granulocytes: 0.1 10*3/uL — ABNORMAL HIGH (ref 0.00–0.07)
Basophils Absolute: 0 10*3/uL (ref 0.0–0.1)
Basophils Relative: 0 %
Eosinophils Absolute: 0 10*3/uL (ref 0.0–0.5)
Eosinophils Relative: 0 %
HCT: 25.6 % — ABNORMAL LOW (ref 36.0–46.0)
Hemoglobin: 8.1 g/dL — ABNORMAL LOW (ref 12.0–15.0)
Immature Granulocytes: 1 %
Lymphocytes Relative: 21 %
Lymphs Abs: 2.1 10*3/uL (ref 0.7–4.0)
MCH: 30.5 pg (ref 26.0–34.0)
MCHC: 31.6 g/dL (ref 30.0–36.0)
MCV: 96.2 fL (ref 80.0–100.0)
Monocytes Absolute: 0.7 10*3/uL (ref 0.1–1.0)
Monocytes Relative: 7 %
Neutro Abs: 7 10*3/uL (ref 1.7–7.7)
Neutrophils Relative %: 71 %
Platelets: 196 10*3/uL (ref 150–400)
RBC: 2.66 MIL/uL — ABNORMAL LOW (ref 3.87–5.11)
RDW: 14.3 % (ref 11.5–15.5)
WBC: 9.9 10*3/uL (ref 4.0–10.5)
nRBC: 0.7 % — ABNORMAL HIGH (ref 0.0–0.2)

## 2019-11-19 LAB — IRON AND TIBC
Iron: 27 ug/dL — ABNORMAL LOW (ref 28–170)
Saturation Ratios: 16 % (ref 10.4–31.8)
TIBC: 172 ug/dL — ABNORMAL LOW (ref 250–450)
UIBC: 145 ug/dL

## 2019-11-19 LAB — GLUCOSE, CAPILLARY
Glucose-Capillary: 158 mg/dL — ABNORMAL HIGH (ref 70–99)
Glucose-Capillary: 135 mg/dL — ABNORMAL HIGH (ref 70–99)
Glucose-Capillary: 135 mg/dL — ABNORMAL HIGH (ref 70–99)
Glucose-Capillary: 142 mg/dL — ABNORMAL HIGH (ref 70–99)
Glucose-Capillary: 109 mg/dL — ABNORMAL HIGH (ref 70–99)
Glucose-Capillary: 132 mg/dL — ABNORMAL HIGH (ref 70–99)

## 2019-11-19 LAB — SODIUM
Sodium: 156 mmol/L — ABNORMAL HIGH (ref 135–145)
Sodium: 154 mmol/L — ABNORMAL HIGH (ref 135–145)
Sodium: 154 mmol/L — ABNORMAL HIGH (ref 135–145)

## 2019-11-19 LAB — CBC
HCT: 22.7 % — ABNORMAL LOW (ref 36.0–46.0)
Hemoglobin: 6.9 g/dL — CL (ref 12.0–15.0)
MCH: 30.3 pg (ref 26.0–34.0)
MCHC: 30.4 g/dL (ref 30.0–36.0)
MCV: 99.6 fL (ref 80.0–100.0)
Platelets: 168 10*3/uL (ref 150–400)
RBC: 2.28 MIL/uL — ABNORMAL LOW (ref 3.87–5.11)
RDW: 13.2 % (ref 11.5–15.5)
WBC: 6.6 10*3/uL (ref 4.0–10.5)
nRBC: 0.6 % — ABNORMAL HIGH (ref 0.0–0.2)

## 2019-11-19 LAB — HEMOGLOBIN AND HEMATOCRIT, BLOOD
HCT: 26.5 % — ABNORMAL LOW (ref 36.0–46.0)
Hemoglobin: 8.3 g/dL — ABNORMAL LOW (ref 12.0–15.0)

## 2019-11-19 LAB — FERRITIN: Ferritin: 273 ng/mL (ref 11–307)

## 2019-11-19 LAB — PREPARE RBC (CROSSMATCH)

## 2019-11-19 MED ORDER — FERROUS SULFATE 325 (65 FE) MG PO TABS
325.0000 mg | ORAL_TABLET | Freq: Two times a day (BID) | ORAL | Status: DC
  Filled 2019-11-19: qty 1

## 2019-11-19 MED ORDER — SODIUM CHLORIDE 0.9% IV SOLUTION: Freq: Once | INTRAVENOUS | Status: AC

## 2019-11-19 MED ORDER — POTASSIUM CHLORIDE 20 MEQ/15ML (10%) PO SOLN
40.0000 meq | ORAL | Status: AC
  Administered 2019-11-19 (×2): 40 meq
  Filled 2019-11-19 (×2): qty 30

## 2019-11-19 MED ORDER — SODIUM CHLORIDE 0.9 % IV SOLN: INTRAVENOUS | Status: DC

## 2019-11-19 NOTE — Progress Notes (Signed)
SLP Cancellation Note  Patient Details Name: Paula Kim MRN: [id number] DOB: November 27, 1955   Cancelled treatment:       Reason Eval/Treat Not Completed: Patient not medically ready - remains on vent. SLP to sign off for now. Please reorder when ready.     Mahala Menghini., M.A. CCC-SLP Acute Rehabilitation Services Pager [phone] Office [phone]  11/19/2019, 7:57 AM

## 2019-11-19 NOTE — Progress Notes (Signed)
[facility] ADULT ICU REPLACEMENT PROTOCOL FOR AM LAB REPLACEMENT ONLY  The patient does apply for the [facility] Adult ICU Electrolyte Replacment Protocol based on the criteria listed below:   1. Is GFR >/= 40 ml/min? Yes.    Patient's GFR today is >60 2. Is urine output >/= 0.5 ml/kg/hr for the last 6 hours? Yes.   Patient's UOP is 0.8 ml/kg/hr 3. Is BUN < 60 mg/dL? Yes.    Patient's BUN today is 24 4. Abnormal electrolyte(s): k 2.8  5. Ordered repletion with: OG per protocol 6. If a panic level lab has been reported, has the CCM MD in charge been notified? No..   Physician:    Markus Daft A 11/19/2019 5:54 AM

## 2019-11-19 NOTE — Progress Notes (Signed)
OT Cancellation Note  Patient Details Name: Paula Kim MRN: [id number] DOB: May 22, 1956   Cancelled Treatment:    Reason Eval/Treat Not Completed: Active bedrest order. OT order received and appreciated however this conflicts with current bedrest order set. Please increase activity tolerance as appropriate and remove bedrest from orders. . Please contact OT at [phone] if bed rest order is discontinued. OT will hold evaluation at this time and will check back as time allows pending increased activity orders.   Wynona Neat, OTR/L  Acute Rehabilitation Services Pager: [phone] Office: [phone] .  11/19/2019, 8:21 AM

## 2019-11-19 NOTE — Care Management (Signed)
Pt currently inappropriate for SBIRT assessment, as remains sedated and intubated.    Quintella Baton, RN, BSN  Trauma/Neuro ICU Case Manager [phone]

## 2019-11-19 NOTE — Progress Notes (Signed)
eLink Physician-Brief Progress Note Patient Name: Paula Kim DOB: Jan 04, 1956 MRN: [id number]   Date of Service  11/19/2019  HPI/Events of Note  Anemia - Hgb = 6.9.   eICU Interventions  Will transfuse 1 unit PRBC.     Intervention Category Major Interventions: Other:  [name] 11/19/2019, 5:58 AM

## 2019-11-19 NOTE — Progress Notes (Signed)
CRITICAL VALUE ALERT  Critical Value:  Hgb 6.9 K 2.8  Date & Time Notied:  11/19/2019 0530   Provider Notified: Pola Corn  Orders Received/Actions taken: Awaiting new orders

## 2019-11-19 NOTE — Progress Notes (Signed)
RT Note:  Patient transferred to CT scan and back to 4N15 without incidence.

## 2019-11-19 NOTE — Progress Notes (Addendum)
NAME:  Paula Kim, MRN:  [id number], DOB:  November 17, 1955, LOS: 5 ADMISSION DATE:  11/09/2019, CONSULTATION DATE: 11/04/2019 REFERRING MD:  Ritta Slot, CHIEF COMPLAINT:  hemiplegia   Brief History   This is a 64 year old female who was found hemiplegic on the morning of 2/18.  On imaging she already had a large right MCA territory infarct.  She underwent a decompressive craniectomy on the evening of 2/18.   History of present illness   Unfortunately we have very little history on this 82 year old.  A friend tried to call her on the morning of admission, as she usually does at 6 AM, and when she was unable to awaken the patient went to visit and found her unresponsive.  She was brought to our department of emergency medicine where a CT and CTA were performed.  She has a large right MCA infarct and an M1 occlusion.  She was not a candidate for either thrombolytics or thrombectomy and she was taken to the operating room for craniectomy on the evening of 2/18.    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 Past surgical history is remarkable for a laparoscopic cholecystectomy.  Significant Hospital Events   Admission 11/09/2019.  Decompressive craniectomy 2/18  Consults:  Neurosurgery   Procedures:  Decompressive craniectomy 2/18  Significant Diagnostic Tests:  CT and CTA head 2/19 >> showing a large right MCA infarct and M1 occlusion  CT head 2/21 >> 1. Stable no new intracranial abnormality.  EEG 2/22 >> left anterior temporal sharp waves with epileptogenicity and no overt seizures  CT head 2/23 >> continued interval evolution of large right MCA territory infarct without hemorrhage, slight increase in parenchymal edema through the craniectomy defect, similar 4 to 5 mm right to left midline shift  Micro Data:  COVID 2/18 > Negative MRSA PCR 2/18 >  Negative Blood 2/22 > Urine 2/22 >   Antimicrobials:  None   Interim history/subjective:  Continues to have periods of agitation, tachypnea, tachycardia Precedex increased 2/22 Keppra increased 2/22 given temporal spikes noted on EEG Progressive anemia, 1 unit PRBC given overnight  Objective   Blood pressure 123/67, pulse 77, temperature 99.1 F (37.3 C), temperature source Axillary, resp. rate (!) 26, height 5\' 1"  (1.549 m), weight 66.3 kg, SpO2 96 %.    Vent Mode: PRVC FiO2 (%):  [30 %-40 %] 30 % Set Rate:  [16 bmp] 16 bmp Vt Set:  [380 mL] 380 mL PEEP:  [5 cmH20] 5 cmH20 Pressure Support:  [5 cmH20] 5 cmH20 Plateau Pressure:  [7 cmH20] 7 cmH20   Intake/Output Summary (Last 24 hours) at 11/19/2019 0733 Last data filed at 11/19/2019 0100 Gross per 24 hour  Intake 2039.56 ml  Output 1400 ml  Net 639.56 ml   Filed Weights   11/07/2019 2034 11/19/19 0500  Weight: 67 kg 66.3 kg    Examination: General: Critically ill-appearing woman, ventilated HEENT: ET tube in place, large tongue, oropharynx moist, craniectomy incision clean and dry Neuro: Moves her right upper and lower extremities with stimulation, follows simple commands, plegic on the left CV: Regular, distant, no murmur, borderline tachycardia PULM: Coarse bilaterally, comfortable respiratory pattern on PSV currently GI: Nondistended, positive bowel sounds Extremities: No edema Skin: No rash  Assessment & Plan:  This is a 64 year old who is suffered from a large right MCA territory infarct and underwent decompressive craniotomy on 11/16/2019.  Acute Hypoxemic Respiratory Failure -Due to large right MCA infarct resulting in coma  P: SBT as she can tolerate.  Tachypnea and agitation have been barriers. She may require tracheostomy to facilitate ventilator weaning given her neurological status. Follow intermittent chest x-ray VAP prevention bundle in place  Large R MCA infarct w/ petechial hemorrhage R parietal   Cerebral edema  Epileptiform activity on EEG 2/22 without overt seizures -s/p decompressive hemicrani 11/19/19 - source unclear P: Appreciate neurology management Maintain euthermia, euglycemia, eunatremia, normoxia Keppra increased on 2/22 Hypertonic saline discontinued, following sodium, 156 > 154 this morning  Circulatory Shock - suspect from sedation, possibly some vasoplegia.  P: Goal wean norepinephrine to off on 2/23, goal SBP 110-180 Continue surveillance for any evidence of active infection.  Repeat cultures sent on 2/22.  Following off empiric antibiotics  Anemia of critical disease, no clear source for acute blood loss Received PRBC morning 2/23 Continue to follow CBC Conservative hemoglobin goal 7.0  Acute metabolic encephalopathy. Suspect multifactorial from delirium, sedating medications and acute stroke. Depression P: Attempt to wean Precedex, fentanyl as able Venlafaxine as ordered  At risk malnutrition due to inadequate oral intake P: Tube feeding and protein supplementation as per protocol   Best practice:  Diet: consider tube feeds Pain/Anxiety/Delirium protocol (if indicated): precedex and fentanyl  VAP protocol (if indicated): In place DVT prophylaxis: SCDs only due bleeding risk  GI prophylaxis: Pepcid Glucose control: None required Mobility: Bed rest Code Status: Full Family Communication: Attempted to call niece 2/23, no answer.  Disposition: maintain ICU  Labs   CBC: Recent Labs  Lab 19-Nov-2019 1200 November 19, 2019 1200 Nov 19, 2019 2112 11/16/19 2020 11/17/19 0349 11/18/19 0306 11/19/19 0450  WBC 11.1*  --   --  11.4* 14.8* 7.6 6.6  NEUTROABS 9.5*  --   --   --   --   --   --   HGB 13.0   < > 9.5* 7.6* 8.2* 7.1* 6.9*  HCT 38.0   < > 28.0* 24.1* 26.9* 23.5* 22.7*  MCV 89.4  --   --  98.0 98.9 98.7 99.6  PLT 259  --   --  152 201 172 168   < > = values in this interval not displayed.    Basic Metabolic Panel: Recent Labs  Lab  11/15/19 0345 11/15/19 1407 11/15/19 1840 11/16/19 0048 11/16/19 6948 11/16/19 0915 11/17/19 0349 11/17/19 0950 11/18/19 0306 11/18/19 0306 11/18/19 0833 11/18/19 1422 11/18/19 2029 11/19/19 0207 11/19/19 0450  NA 147*   < > 155*   < > 157*   < > 160*   < > 158*   < > 160* 160* 154* 156* 154*  K 3.6  --   --   --  3.5  --  3.4*  --  3.8  --   --   --   --   --  2.8*  CL 116*  --   --   --  125*  --  126*  --  123*  --   --   --   --   --  119*  CO2 20*  --   --   --  25  --  24  --  26  --   --   --   --   --  27  GLUCOSE 204*  --   --   --  152*  --  141*  --  106*  --   --   --   --   --  162*  BUN 12  --   --   --  22  --  18  --  25*  --   --   --   --   --  24*  CREATININE 0.80  --   --   --  0.77  --  0.58  --  0.70  --   --   --   --   --  0.57  CALCIUM 7.9*  --   --   --  8.1*  --  8.3*  --  7.9*  --   --   --   --   --  8.2*  MG 2.0  --  2.3  --  2.4  --   --   --   --   --   --   --   --   --   --   PHOS  --   --  2.9  --  1.7*  --  3.0  --   --   --   --   --   --   --   --    < > = values in this interval not displayed.   GFR: Estimated Creatinine Clearance: 62.7 mL/min (by C-G formula based on SCr of 0.57 mg/dL). Recent Labs  Lab 11/16/19 2020 11/17/19 0349 11/18/19 0306 11/19/19 0450  WBC 11.4* 14.8* 7.6 6.6    Liver Function Tests: Recent Labs  Lab 10/28/2019 1030  AST 29  ALT 18  ALKPHOS 101  BILITOT 1.0  PROT 8.1  ALBUMIN 4.2   No results for input(s): LIPASE, AMYLASE in the last 168 hours. No results for input(s): AMMONIA in the last 168 hours.  ABG    Component Value Date/Time   PHART 7.374 11/13/2019 2112   PCO2ART 41.0 11/19/2019 2112   PO2ART 114.0 (H) 11/13/2019 2112   HCO3 23.9 11/23/2019 2112   TCO2 25 11/22/2019 2112   ACIDBASEDEF 1.0 11/20/2019 2112   O2SAT 98.0 11/07/2019 2112     Coagulation Profile: Recent Labs  Lab 11/02/2019 1030  INR 1.0    Cardiac Enzymes: No results for input(s): CKTOTAL, CKMB, CKMBINDEX,  TROPONINI in the last 168 hours.  HbA1C: Hgb A1c MFr Bld  Date/Time Value Ref Range Status  11/15/2019 03:45 AM 5.0 4.8 - 5.6 % Final    Comment:    (NOTE) Pre diabetes:          5.7%-6.4% Diabetes:              >6.4% Glycemic control for   <7.0% adults with diabetes     CBG: Recent Labs  Lab 11/18/19 1113 11/18/19 1509 11/18/19 1916 11/18/19 2356 11/19/19 0309  GLUCAP 113* 111* 138* 131* 158*   CRITICAL CARE Performed by: Leslye Peer  Total critical care time: 32 minutes  Critical care time was exclusive of separately billable procedures and treating other patients.  Critical care was necessary to treat or prevent imminent or life-threatening deterioration.  Critical care was time spent personally by me on the following activities: development of treatment plan with patient and/or surrogate as well as nursing, discussions with consultants, evaluation of patient's response to treatment, examination of patient, obtaining history from patient or surrogate, ordering and performing treatments and interventions, ordering and review of laboratory studies, ordering and review of radiographic studies, pulse oximetry and re-evaluation of patient's condition.   Levy Pupa, MD, PhD 11/19/2019, 7:45 AM [facility] [phone] or if no answer  [phone]      

## 2019-11-19 NOTE — Progress Notes (Signed)
STROKE TEAM PROGRESS NOTE   INTERVAL HISTORY Patient still intubated and sedated, on ventilation.  Without stimulation, patient calm and quiet.  With stimulation, patient became agitated right arm and leg alternating with extension and flexion, with tachycardia and tachypnea, gradually calm down without stimulation.  Hemoglobin 6.9 this morning received PRBC transfusion.  Vitals:   11/19/19 0900 11/19/19 0945 11/19/19 1000 11/19/19 1015  BP: 117/63 117/67 117/66 120/69  Pulse: 77 79 74 81  Resp: (!) 22 (!) 25 (!) 25 (!) 22  Temp:  100 F (37.8 C) 100.2 F (37.9 C)   TempSrc:      SpO2:   98% 99%  Weight:      Height:        CBC:  Recent Labs  Lab 11/16/2019 1200 10/30/2019 2112 11/18/19 0306 11/19/19 0450  WBC 11.1*   < > 7.6 6.6  NEUTROABS 9.5*  --   --   --   HGB 13.0   < > 7.1* 6.9*  HCT 38.0   < > 23.5* 22.7*  MCV 89.4   < > 98.7 99.6  PLT 259   < > 172 168   < > = values in this interval not displayed.    Basic Metabolic Panel:  Recent Labs  Lab 11/15/19 1840 11/16/19 0048 11/16/19 1610 11/16/19 0915 11/17/19 0349 11/17/19 0950 11/18/19 0306 11/18/19 0833 11/19/19 0450 11/19/19 0815  NA 155*   < > 157*   < > 160*   < > 158*   < > 154* 154*  K  --   --  3.5   < > 3.4*  --  3.8  --  2.8*  --   CL  --   --  125*   < > 126*  --  123*  --  119*  --   CO2  --   --  25   < > 24  --  26  --  27  --   GLUCOSE  --   --  152*   < > 141*  --  106*  --  162*  --   BUN  --   --  22   < > 18  --  25*  --  24*  --   CREATININE  --   --  0.77   < > 0.58  --  0.70  --  0.57  --   CALCIUM  --   --  8.1*   < > 8.3*  --  7.9*  --  8.2*  --   MG 2.3  --  2.4  --   --   --   --   --   --   --   PHOS 2.9  --  1.7*  --  3.0  --   --   --   --   --    < > = values in this interval not displayed.   Lipid Panel:     Component Value Date/Time   CHOL 125 11/15/2019 0345   TRIG 151 (H) 11/15/2019 0345   HDL 25 (L) 11/15/2019 0345   CHOLHDL 5.0 11/15/2019 0345   VLDL 30 11/15/2019  0345   LDLCALC 70 11/15/2019 0345   HgbA1c:  Lab Results  Component Value Date   HGBA1C 5.0 11/15/2019   Urine Drug Screen:     Component Value Date/Time   LABOPIA NONE DETECTED 11/15/2019 1315   COCAINSCRNUR NONE DETECTED 11/13/2019 1315   LABBENZ NONE DETECTED  12-01-19 1315   AMPHETMU NONE DETECTED December 01, 2019 1315   THCU POSITIVE (A) 12-01-2019 1315   LABBARB NONE DETECTED 12/01/2019 1315    Alcohol Level     Component Value Date/Time   ETH <10 Dec 01, 2019 1030    IMAGING  CT head:  01-Dec-2019   1. Extensive abnormal hypodensity throughout much of the right MCA vascular territory consistent with acute/early subacute ischemic infarction. No evidence of hemorrhagic conversion. No significant mass effect at this time.  2. Background chronic ischemic changes as detailed  3. Mild generalized parenchymal atrophy.  4. Air-fluid level and frothy secretions within the left maxillary sinus. Correlate for acute sinusitis.   CT C-SPINE NO CHARGE Dec 01, 2019 IMPRESSION:  1. No acute fracture or dislocation of the cervical spine.  2. Multilevel degenerative disc disease and facet arthropathy.  3. Thoracic aortic aneurysm. Please refer to forthcoming CT angiogram report for full evaluation of the vascular findings.  4. 1.8 cm right thyroid lobe nodule. Dedicated thyroid ultrasound is recommended on a nonemergent basis.  5. Centrilobular emphysema. Emphysema (ICD10-J43.9).   CTA head:  01-Dec-2019   1. 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 No significant reconstitution of the right M1 MCA more distally. There is minimal reconstitution of right M2 and more distal right MCA branches.  2. Posterior circulation atherosclerotic stenoses. Most notably, there is focal near occlusive stenosis versus focal occlusion of the distal left vertebral artery just proximal to the vertebrobasilar junction. Moderate to moderately severe stenoses within the mid P2 posterior cerebral  arteries bilaterally.  3. Mild atherosclerotic plaque within the intracranial internal carotid arteries.   CTA neck:  12-01-19   1. The bilateral common carotid, internal carotid and vertebral arteries are patent within the neck without significant stenosis (50% or greater).  2. Mixed plaque within the carotid bifurcations and proximal internal carotid arteries bilaterally. There is possible ulcerated plaque within the right carotid bulb. There is lobular dilation of the bilateral carotid bulbs. Additionally, there is significant tortuosity of the proximal left ICA.  3. 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 Please correlate with findings on concurrently performed CTA chest/abdomen/pelvis.  4. 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  5. 1.8 cm nodule within the right thyroid lobe. Nonemergent dedicated thyroid ultrasound is recommended for further evaluation, as clinically appropriate.  ADDENDUM: The presence of a thoracic aortic aneurysm, lobular dilation of both carotid bulbs and 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   CT perfusion head 01-Dec-2019    The perfusion software identifies a 122 mL core infarct within the right MCA vascular territory. The perfusion software identifies a 139 mL region of critically hypoperfused parenchyma within the right MCA vascular territory. Reported mismatch volume 17 mL.   MR BRAIN WO CONTRAST 12/01/19 IMPRESSION:  1. Motion degraded exam.  2. Acute ischemic infarction involving much of the right MCA vascular territory. Only mild mass effect at this time. Mild petechial hemorrhage within portions of the right parietal infarction territory.  3. Redemonstrated small chronic cortically based infarcts within the left parietooccipital lobes.  4. Moderate/advanced chronic small vessel ischemic disease with  multiple chronic lacunar infarcts, progressed from MRI 07/08/2009.  5. Mild generalized parenchymal atrophy.  6. Air-fluid level and frothy secretions within the left maxillary sinus. Correlate for acute sinusitis.  CT HEAD WO CONTRAST 11/15/2019 IMPRESSION:  1. Large right MCA infarct with no hemorrhagic transformation.  2. Status post right hemi-craniectomy with mild intracranial mass effect. 5 mm of leftward midline shift. Patent basilar cisterns.  3. Underlying chronic small vessel disease.  CT HEAD WO CONTRAST 11/17/2019 1. Stable since 11/15/2019. Large right MCA infarct with no malignant hemorrhagic transformation. Stable 5 mm of leftward midline shift. 2. No new intracranial abnormality.   CT HEAD WO CONTRAST 11/19/2019 1. Continued interval evolution of large right MCA territory infarct without evidence for hemorrhagic transformation. Slight interval increase in swelling of brain parenchyma through the craniectomy defect, but with similar 4-5 mm right-to-left midline shift. 2. No other new acute intracranial abnormality.   CT Angio Chest/Abd/Pel for Dissection W and/or W/WO 11-20-19 IMPRESSION:  1. 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 No dissection or intramural hematoma.  2. 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 Recommend followup by abdomen and pelvis CTA in 6 months, and vascular surgery referral/consultation if not already obtained. This recommendation follows ACR consensus guidelines: White Paper of the ACR Incidental Findings Committee II on Vascular Findings. J Am Coll Radiol 2013; 10:789-794. Aortic aneurysm NOS (ICD10-I71.9)  3. 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  4. Cirrhotic changes involving the liver. No worrisome hepatic lesions.  5. Emphysematous changes and pulmonary  scarring but no acute pulmonary findings.  6. 17 mm right thyroid nodule. Recommend thyroid US and potential biopsy (ref: J Am Coll Radiol. 2015 Feb;12(2): 143-50).  7. Aortic Atherosclerosis (ICD10-I70.0) and Emphysema (ICD10-J43.9).   DG CHEST PORT 1 VIEW 11/18/2019 1. Unchanged retrocardiac opacity with volume loss, may be atelectasis, pneumonia or aspiration. Perihilar atelectasis on the right. 2. Stable support apparatus allowing for patient rotation.   DG Chest 2 View 2019/11/20 IMPRESSION:  No active disease.  Aortic atherosclerosis, tortuosity and ectasia.   DG Chest Port 1 View 11/20/19 IMPRESSION:  1.  Lungs clear.  2. Prominence of the descending aorta with questionable degree of aneurysmal dilatation distally.   ECHOCARDIOGRAM COMPLETE 11/20/19 IMPRESSIONS   1. Left ventricular ejection fraction, by estimation, is 60 to 65%. The left ventricle has normal function. The left ventricle has no regional wall motion abnormalities. Indeterminate diastolic filling due to E-A fusion.   2. Right ventricular systolic function is normal. The right ventricular size is normal. Tricuspid regurgitation signal is inadequate for assessing PA pressure.   3. The mitral valve is normal in structure and function. No evidence of mitral valve regurgitation. No evidence of mitral stenosis.   4. The aortic valve is tricuspid. Aortic valve regurgitation is not visualized. Mild aortic valve sclerosis is present, with no evidence of aortic valve stenosis.   5. The inferior vena cava is normal in size with greater than 50% respiratory variability, suggesting right atrial pressure of 3 mmHg.   6. Technically difficult study with poor acoustic windows.   EEG adult 11/18/2019 This technically difficult study showed evidence of epileptogenicity in left anterior temporal region as well as moderate to severe diffuse encephalopathy, non specific to etiology. No seizures  were seen throughout the recording. If  concern for ictal and interictal activity persists, please consider repeat prolonged study.   ECG - ST rate 109 BPM. (See cardiology reading for complete details)   PHYSICAL EXAM   Temp:  [98.7 F (37.1 C)-101.2 F (38.4 C)] 100.2 F (37.9 C) (02/23 1000) Pulse Rate:  [70-163] 81 (02/23 1015) Resp:  [15-39] 22 (  02/23 1015) BP: (77-167)/(48-90) 120/69 (02/23 1015) SpO2:  [92 %-100 %] 99 % (02/23 1015) FiO2 (%):  [30 %-40 %] 30 % (02/23 0746) Weight:  [66.3 kg] 66.3 kg (02/23 0500)  General - Well nourished, well developed, intubated on precedex and fentanyl.  Ophthalmologic - fundi not visualized due to noncooperation.  Cardiovascular - Regular rate and rhythm.  However tachycardia with stimulation  Neuro - intubated on precedex and fentanyl, eyes closed barely open with painful stimulation, barely following limited simple commands on the right hand. With forced eye opening, eyes right gaze preference, not cross midline, no nystagmus. Not blinking to visual threat bilaterally, PERRL. Corneal reflex present, gag and cough present. Breathing over the vent.  Facial symmetry not able to test due to ET tube.  Tongue protrusion not cooperative. RUE and RLE spontaneous movement, against gravity. On pain stimulation, LUE 0/5 and LLE slight withdraw. DTR 1+ and positive babinski on the left. increased muscle tone on the LUE. Sensation, coordination and gait not tested.   ASSESSMENT/PLAN Ms. Aleesia Henney is a 64 y.o. female with history of HLD found down presenting with L sided weakness and dysarthria.  Stroke:   Large R MCA infarct w/ petechial hemorrhage R parietal s/p decompressive hemicrani - source unclear, ? large vessel vasculopathy  CT head extensive hypodensity R MCA territory w/ infarct.  CTA head proximal R M1 occlusion. Posterior circulation atherosclerosis - L VA stenosis vs focal occlusion. B mid P2 PCA moderate to severe stenoses.  CTA neck mixed plaque bifurcations and  proximal B ICAs. Possible ulcerated plaque R ICA bulb. Lobar dilation B ICA bulbs. L ICA tortuous. 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   CT perfusion positive for penumbra  MRI  R MCA infarct w/ R parietal petechial hemorrhage. Chronic L parietooccipital infarcts.   CT repeat 2/21 - stable 17mm MLS  CT repeat 2/23 - slight interval increase in brain swelling through crani, interval evolution, stable 4 to 5 mm MLS  LE venous doppler - negative for DVT bilaterally  2D Echo EF 60-65%. No source of embolus   Consider 30 day cardiac event monitoring as outpt  LDL 70  HgbA1c 5.0  Heparin 5000 units sq tid for VTE prophylaxis  aspirin 325 mg daily prior to admission, now on aspirin 325 mg daily.   Therapy recommendations:  pending   Disposition:  pending   Acute Respiratory Failure  Intubated  On vent  Sedated with fentanyl and precedex  CCM onboard  Not candidate for extubation at this time  Will likely require trach for weaning  Cerebral Edema  S/p Right decompression hemicrani by Cabbell on 2019-12-08  CT 2/21 stable 90mm MLS  CT repeat 2/23 - slight interval increase in brain swelling through crani, interval evolution, stable 4 to 5 mm MLS  On Keppra for seizure prevention  On 3% saline -> off -> NS @40  -> 1/2NS @40  ->NS @40   Na 140->147->152->157->159->160->158->157->158->154->154  Sodium goal 150-160    Sodium monitoring every 6 hours  ? Seizure activity  Stimulation induced increased muscle tone at LUE and tonic posturing of RUE and RLE.   EEG left frontal sharp waves  On keppra 500 -> increase to 1000 bid  Repeat EEG in the a.m.  Fever   Tmax 101.2->100.6  On ice pack  Mild Leukocytosis - 11.1->14.8->7.6->6.6  Blood culture 2/22 no growth < 12h  UA neg   CXR unchanged - atx, pna or aspiration  Surgical wound clear  dry, no pus  no abx per CCM  Acute blood loss anemia   Hb -  13->7.6->8.2->7.1->6.9->1u PRBC-> 8.1  Stool guaiac pending  Close monitoring CBC  Iron panel Fer ok, Fe 27, TIBC 172   On iron supplement  Hypotension  Off low dose Levophed  Stable now . SBP goal 110-180 . Long-term BP goal normotensive  Hyperlipidemia  Home meds:  pravachol 40, fish oil, fenofibrate  LDL 70, goal < 70  On Crestor 20  Continue statin on discharge  Dysphagia . Secondary to stroke . NPO . on TF @ 40 . IVF @ 40 . 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 F/u in 6 mos. 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   Repeat in 6 months  Other Stroke Risk Factors  Former Cigarette smoker, quit 13 yrs ago  Substance abuse - UDS:  THC POSITIVE, Will advise to stop using due to stroke risk.  Hx stroke/TIA  06/2009 - midbrain and L PCA territory infarcts. Small vessel disease.    Other Active Problems  Hx skew deviation since childhood, worsened post stroke  Hypokalemia 2.7-3.6->3.4->3.8->2.8->3.3 supplement  R thyroid nodule. Nonemergent [place] recommended  Depression on Effexor  Hospital day # 5  This patient is critically ill and at significant risk of neurological worsening, death and care requires constant monitoring of vital signs, hemodynamics,respiratory and cardiac monitoring, extensive review of multiple databases, frequent neurological assessment, discussion with family, other specialists and medical decision making of high complexity. I spent 35 minutes of neurocritical care time  in the care of  this patient.   Marvel Plan, MD PhD Stroke Neurology 11/19/2019 10:38 AM  To contact Stroke Continuity provider, please refer to [email or web address]. After hours, contact General Neurology

## 2019-11-19 NOTE — Progress Notes (Signed)
PT Cancellation Note  Patient Details Name: Paula Kim MRN: [id number] DOB: 07/02/1956   Cancelled Treatment:    Reason Eval/Treat Not Completed: Active bedrest order. Pt continues to be intubated, sedated and on bed rest. Acute PT to return to complete PT eval once activity orders advanced.  Lewis Shock, PT, DPT Acute Rehabilitation Services Pager #: [phone] Office #: [phone]    Iona Hansen 11/19/2019, 12:02 PM

## 2019-11-20 LAB — GLUCOSE, CAPILLARY
Glucose-Capillary: 129 mg/dL — ABNORMAL HIGH (ref 70–99)
Glucose-Capillary: 139 mg/dL — ABNORMAL HIGH (ref 70–99)
Glucose-Capillary: 143 mg/dL — ABNORMAL HIGH (ref 70–99)
Glucose-Capillary: 124 mg/dL — ABNORMAL HIGH (ref 70–99)
Glucose-Capillary: 147 mg/dL — ABNORMAL HIGH (ref 70–99)
Glucose-Capillary: 138 mg/dL — ABNORMAL HIGH (ref 70–99)

## 2019-11-20 LAB — CBC
HCT: 24.9 % — ABNORMAL LOW (ref 36.0–46.0)
Hemoglobin: 7.9 g/dL — ABNORMAL LOW (ref 12.0–15.0)
MCH: 30.5 pg (ref 26.0–34.0)
MCHC: 31.7 g/dL (ref 30.0–36.0)
MCV: 96.1 fL (ref 80.0–100.0)
Platelets: 194 10*3/uL (ref 150–400)
RBC: 2.59 MIL/uL — ABNORMAL LOW (ref 3.87–5.11)
RDW: 14.3 % (ref 11.5–15.5)
WBC: 10 10*3/uL (ref 4.0–10.5)
nRBC: 0.5 % — ABNORMAL HIGH (ref 0.0–0.2)

## 2019-11-20 LAB — TYPE AND SCREEN
ABO/RH(D): A POS
Antibody Screen: NEGATIVE
Unit division: 0

## 2019-11-20 LAB — BASIC METABOLIC PANEL
Anion gap: 7 (ref 5–15)
BUN: 20 mg/dL (ref 8–23)
CO2: 24 mmol/L (ref 22–32)
Calcium: 7.6 mg/dL — ABNORMAL LOW (ref 8.9–10.3)
Chloride: 121 mmol/L — ABNORMAL HIGH (ref 98–111)
Creatinine, Ser: 0.61 mg/dL (ref 0.44–1.00)
GFR calc Af Amer: >60 mL/min (ref >60)
GFR calc non Af Amer: >60 mL/min (ref >60)
Glucose, Bld: 127 mg/dL — ABNORMAL HIGH (ref 70–99)
Potassium: 3.1 mmol/L — ABNORMAL LOW (ref 3.5–5.1)
Sodium: 152 mmol/L — ABNORMAL HIGH (ref 135–145)

## 2019-11-20 LAB — EXPECTORATED SPUTUM ASSESSMENT W GRAM STAIN, RFLX TO RESP C

## 2019-11-20 LAB — BPAM RBC
Blood Product Expiration Date: 202103012359
ISSUE DATE / TIME: 202102230936
Unit Type and Rh: 6200

## 2019-11-20 MED ORDER — POTASSIUM CHLORIDE 20 MEQ PO PACK
40.0000 meq | PACK | ORAL | Status: DC
  Filled 2019-11-20: qty 2

## 2019-11-20 MED ORDER — POTASSIUM CHLORIDE 20 MEQ PO PACK
40.0000 meq | PACK | ORAL | Status: AC
  Administered 2019-11-20 (×2): 40 meq

## 2019-11-20 MED ORDER — SENNOSIDES-DOCUSATE SODIUM 8.6-50 MG PO TABS
1.0000 | ORAL_TABLET | Freq: Every day | ORAL | Status: DC
  Administered 2019-11-20 – 2019-11-24 (×5): 1
  Filled 2019-11-20 (×4): qty 1

## 2019-11-20 MED ORDER — FERROUS SULFATE 220 (44 FE) MG/5ML PO ELIX
325.0000 mg | ORAL_SOLUTION | Freq: Two times a day (BID) | ORAL | Status: DC
  Filled 2019-11-20: qty 7.4

## 2019-11-20 MED ORDER — FERROUS SULFATE 300 (60 FE) MG/5ML PO SYRP
300.0000 mg | ORAL_SOLUTION | Freq: Two times a day (BID) | ORAL | Status: DC
  Administered 2019-11-20 – 2019-11-25 (×11): 300 mg
  Filled 2019-11-20 (×12): qty 5

## 2019-11-20 MED ORDER — CLONAZEPAM 0.25 MG PO TBDP
0.2500 mg | ORAL_TABLET | Freq: Two times a day (BID) | ORAL | Status: DC
  Administered 2019-11-20 – 2019-11-25 (×11): 0.25 mg
  Filled 2019-11-20 (×11): qty 1

## 2019-11-20 NOTE — Progress Notes (Signed)
STROKE TEAM PROGRESS NOTE   INTERVAL HISTORY Pt still intubated on sedation, no significant neuro changes. No acute event overnight. Hb so far stable, s/p PRBC yesterday.   Vitals:   11/20/19 0600 11/20/19 0700 11/20/19 0732 11/20/19 0740  BP: 119/63 (!) 150/87 (!) 88/49 (!) 92/49  Pulse:  78 80   Resp: (!) 22 (!) 25 18   Temp:      TempSrc:      SpO2:  (!) 85% 100%   Weight:      Height:        CBC:  Recent Labs  Lab 15-Nov-2019 1200 November 15, 2019 2112 11/19/19 1815 11/20/19 0238  WBC 11.1*   < > 9.9 10.0  NEUTROABS 9.5*  --  7.0  --   HGB 13.0   < > 8.1* 7.9*  HCT 38.0   < > 25.6* 24.9*  MCV 89.4   < > 96.2 96.1  PLT 259   < > 196 194   < > = values in this interval not displayed.    Basic Metabolic Panel:  Recent Labs  Lab 11/15/19 1840 11/16/19 0048 11/16/19 0938 11/16/19 0915 11/17/19 0349 11/17/19 0950 11/19/19 1815 11/20/19 0238  NA 155*   < > 157*   < > 160*   < > 152* 152*  K  --   --  3.5   < > 3.4*   < > 3.3* 3.1*  CL  --   --  125*   < > 126*   < > 121* 121*  CO2  --   --  25   < > 24   < > 26 24  GLUCOSE  --   --  152*   < > 141*   < > 145* 127*  BUN  --   --  22   < > 18   < > 22 20  CREATININE  --   --  0.77   < > 0.58   < > 0.62 0.61  CALCIUM  --   --  8.1*   < > 8.3*   < > 7.9* 7.6*  MG 2.3  --  2.4  --   --   --   --   --   PHOS 2.9  --  1.7*  --  3.0  --   --   --    < > = values in this interval not displayed.   Lipid Panel:     Component Value Date/Time   CHOL 125 11/15/2019 0345   TRIG 151 (H) 11/15/2019 0345   HDL 25 (L) 11/15/2019 0345   CHOLHDL 5.0 11/15/2019 0345   VLDL 30 11/15/2019 0345   LDLCALC 70 11/15/2019 0345   HgbA1c:  Lab Results  Component Value Date   HGBA1C 5.0 11/15/2019   Urine Drug Screen:     Component Value Date/Time   LABOPIA NONE DETECTED 11/15/2019 1315   COCAINSCRNUR NONE DETECTED November 15, 2019 1315   LABBENZ NONE DETECTED 11/15/19 1315   AMPHETMU NONE DETECTED 2019-11-15 1315   THCU POSITIVE (A)  15-Nov-2019 1315   LABBARB NONE DETECTED 2019-11-15 1315    Alcohol Level     Component Value Date/Time   ETH <10 November 15, 2019 1030    IMAGING  CT head:  November 15, 2019   1. Extensive abnormal hypodensity throughout much of the right MCA vascular territory consistent with acute/early subacute ischemic infarction. No evidence of hemorrhagic conversion. No significant mass effect at this time.  2. Background chronic ischemic  changes as detailed  3. Mild generalized parenchymal atrophy.  4. Air-fluid level and frothy secretions within the left maxillary sinus. Correlate for acute sinusitis.   CT C-SPINE NO CHARGE 11/04/2019 IMPRESSION:  1. No acute fracture or dislocation of the cervical spine.  2. Multilevel degenerative disc disease and facet arthropathy.  3. Thoracic aortic aneurysm. Please refer to forthcoming CT angiogram report for full evaluation of the vascular findings.  4. 1.8 cm right thyroid lobe nodule. Dedicated thyroid ultrasound is recommended on a nonemergent basis.  5. Centrilobular emphysema. Emphysema (ICD10-J43.9).   CTA head:  10/28/2019   1. 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 No significant reconstitution of the right M1 MCA more distally. There is minimal reconstitution of right M2 and more distal right MCA branches.  2. Posterior circulation atherosclerotic stenoses. Most notably, there is focal near occlusive stenosis versus focal occlusion of the distal left vertebral artery just proximal to the vertebrobasilar junction. Moderate to moderately severe stenoses within the mid P2 posterior cerebral arteries bilaterally.  3. Mild atherosclerotic plaque within the intracranial internal carotid arteries.   CTA neck:  11/11/2019   1. The bilateral common carotid, internal carotid and vertebral arteries are patent within the neck without significant stenosis (50% or greater).  2. Mixed plaque within the carotid bifurcations and proximal internal  carotid arteries bilaterally. There is possible ulcerated plaque within the right carotid bulb. There is lobular dilation of the bilateral carotid bulbs. Additionally, there is significant tortuosity of the proximal left ICA.  3. 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 Please correlate with findings on concurrently performed CTA chest/abdomen/pelvis.  4. 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  5. 1.8 cm nodule within the right thyroid lobe. Nonemergent dedicated thyroid ultrasound is recommended for further evaluation, as clinically appropriate.  ADDENDUM: The presence of a thoracic aortic aneurysm, lobular dilation of both carotid bulbs and 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   CT perfusion head 11/24/2019    The perfusion software identifies a 122 mL core infarct within the right MCA vascular territory. The perfusion software identifies a 139 mL region of critically hypoperfused parenchyma within the right MCA vascular territory. Reported mismatch volume 17 mL.   MR BRAIN WO CONTRAST 10/30/2019 IMPRESSION:  1. Motion degraded exam.  2. Acute ischemic infarction involving much of the right MCA vascular territory. Only mild mass effect at this time. Mild petechial hemorrhage within portions of the right parietal infarction territory.  3. Redemonstrated small chronic cortically based infarcts within the left parietooccipital lobes.  4. Moderate/advanced chronic small vessel ischemic disease with multiple chronic lacunar infarcts, progressed from MRI 07/08/2009.  5. Mild generalized parenchymal atrophy.  6. Air-fluid level and frothy secretions within the left maxillary sinus. Correlate for acute sinusitis.  CT HEAD WO CONTRAST 11/15/2019 IMPRESSION:  1. Large right MCA infarct with no hemorrhagic transformation.  2. Status post right  hemi-craniectomy with mild intracranial mass effect. 5 mm of leftward midline shift. Patent basilar cisterns.  3. Underlying chronic small vessel disease.  CT HEAD WO CONTRAST 11/17/2019 1. Stable since 11/15/2019. Large right MCA infarct with no malignant hemorrhagic transformation. Stable 5 mm of leftward midline shift. 2. No new intracranial abnormality.   CT HEAD WO CONTRAST 11/19/2019 1. Continued interval evolution of large right MCA territory infarct without evidence for hemorrhagic transformation. Slight interval increase in swelling of brain parenchyma  through the craniectomy defect, but with similar 4-5 mm right-to-left midline shift. 2. No other new acute intracranial abnormality.   CT Angio Chest/Abd/Pel for Dissection W and/or W/WO 11/19/2019 1. 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 No dissection or intramural hematoma.  2. 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 Recommend followup by abdomen and pelvis CTA in 6 months, and vascular surgery referral/consultation if not already obtained. This recommendation follows ACR consensus guidelines: White Paper of the ACR Incidental Findings Committee II on Vascular Findings. J Am Coll Radiol 2013; 10:789-794. Aortic aneurysm NOS (ICD10-I71.9)  3. 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  4. Cirrhotic changes involving the liver. No worrisome hepatic lesions.  5. Emphysematous changes and pulmonary scarring but no acute pulmonary findings.  6. 17 mm right thyroid nodule. Recommend thyroid US and potential biopsy (ref: J Am Coll Radiol. 2015 Feb;12(2): 143-50).  7. Aortic Atherosclerosis (ICD10-I70.0) and Emphysema (ICD10-J43.9).   DG CHEST PORT 1 VIEW 11/18/2019 1. Unchanged retrocardiac opacity with volume loss, may be atelectasis, pneumonia or  aspiration. Perihilar atelectasis on the right. 2. Stable support apparatus allowing for patient rotation.   DG CHEST PORT 1 VIEW 11/17/2019 Retrocardiac opacity with volume loss suggesting atelectasis. Given the history, superimposed infection is not excluded. Unremarkable hardware  DG Chest 2 View 11/04/2019 No active disease.  Aortic atherosclerosis, tortuosity and ectasia.   DG Chest Port 1 View 11/16/2019 1.  Lungs clear.  2. Prominence of the descending aorta with questionable degree of aneurysmal dilatation distally.   ECHOCARDIOGRAM COMPLETE 11/01/2019 1. Left ventricular ejection fraction, by estimation, is 60 to 65%. The left ventricle has normal function. The left ventricle has no regional wall motion abnormalities. Indeterminate diastolic filling due to E-A fusion.   2. Right ventricular systolic function is normal. The right ventricular size is normal. Tricuspid regurgitation signal is inadequate for assessing PA pressure.   3. The mitral valve is normal in structure and function. No evidence of mitral valve regurgitation. No evidence of mitral stenosis.   4. The aortic valve is tricuspid. Aortic valve regurgitation is not visualized. Mild aortic valve sclerosis is present, with no evidence of aortic valve stenosis.   5. The inferior vena cava is normal in size with greater than 50% respiratory variability, suggesting right atrial pressure of 3 mmHg.   6. Technically difficult study with poor acoustic windows.   EEG adult 11/18/2019 This technically difficult study showed evidence of epileptogenicity in left anterior temporal region as well as moderate to severe diffuse encephalopathy, non specific to etiology. No seizures  were seen throughout the recording. If concern for ictal and interictal activity persists, please consider repeat prolonged study.   ECG - ST rate 109 BPM. (See cardiology reading for complete details)  PHYSICAL EXAM  Temp:  [98.9 F (37.2 C)-100.6 F  (38.1 C)] 100.6 F (38.1 C) (02/24 0400) Pulse Rate:  [69-147] 80 (02/24 0732) Resp:  [14-35] 18 (02/24 0732) BP: (88-179)/(49-96) 92/49 (02/24 0740) SpO2:  [85 %-100 %] 100 % (02/24 0732) FiO2 (%):  [30 %] 30 % (02/24 0732) Weight:  [68.6 kg] 68.6 kg (02/24 0500)  General - Well nourished, well developed, intubated on precedex and fentanyl.  Ophthalmologic - fundi not visualized due to noncooperation.  Cardiovascular - Regular rate and rhythm without stimulation  Neuro - intubated on precedex and fentanyl, eyes closed barely  open with painful stimulation, barely following limited simple commands on the right hand. With forced eye opening, eyes right gaze preference, not cross midline, no nystagmus. Not blinking to visual threat bilaterally, PERRL. Corneal reflex present, gag and cough present. Breathing over the vent.  Facial symmetry not able to test due to ET tube.  Tongue protrusion not cooperative. RUE and RLE spontaneous movement, against gravity. On pain stimulation, LUE 0/5 and LLE slight withdraw. DTR 1+ and positive babinski bilaterally. Sensation, coordination and gait not tested.   ASSESSMENT/PLAN Ms. Paula Kim is a 64 y.o. female with history of HLD found down presenting with L sided weakness and dysarthria.  Stroke:   Large R MCA infarct w/ petechial hemorrhage R parietal s/p decompressive hemicrani - source unclear, ? large vessel vasculopathy  CT head extensive hypodensity R MCA territory w/ infarct.  CTA head proximal R M1 occlusion. Posterior circulation atherosclerosis - L VA stenosis vs focal occlusion. B mid P2 PCA moderate to severe stenoses.  CTA neck mixed plaque bifurcations and proximal B ICAs. Possible ulcerated plaque R ICA bulb. Lobar dilation B ICA bulbs. L ICA tortuous. 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   CT perfusion positive for penumbra  MRI  R MCA infarct w/ R parietal petechial  hemorrhage. Chronic L parietooccipital infarcts.   CT repeat 2/21 - stable 71mm MLS  CT repeat 2/23 - slight interval increase in brain swelling through crani, interval evolution, stable 4 to 5 mm MLS  LE venous doppler - negative for DVT bilaterally  2D Echo EF 60-65%. No source of embolus   Consider 30 day cardiac event monitoring as outpt  LDL 70  HgbA1c 5.0  Heparin 5000 units sq tid for VTE prophylaxis  aspirin 325 mg daily prior to admission, now on aspirin 325 mg daily.   Therapy recommendations:  Pending. Reorder when able to evaluate  Disposition:  pending   Acute Respiratory Failure  Intubated  On vent  Sedated with precedex  Klonopin added 0.25 bid  CCM onboard  Not candidate for extubation at this time  Will likely require trach for weaning  Cerebral Edema  S/p Right decompression hemicrani by Cabbell on 2019-11-30  CT 2/21 stable 70mm MLS  CT repeat 2/23 - slight interval increase in brain swelling through crani, interval evolution, stable 4 to 5 mm MLS  On Keppra for seizure prevention  On 3% saline -> off -> NS @40  -> 1/2NS @40  ->NS @40   Na 140->...>154->154->152->152  Sodium goal 150-160    Sodium monitoring every 6 hours  ? Seizure activity  Stimulation induced increased muscle tone at LUE and tonic posturing of RUE and RLE.   EEG left frontal sharp waves  On keppra 500 -> increase to 1000 bid  Repeat EEG in am  Fever   Tmax 101.2->100.6  On ice pack  Mild Leukocytosis, - 11.1->14.8->7.6->6.6->9.9->10 resolved  Blood culture 2/22 no growth < 12h    UA neg   CXR unchanged - atx, pna or aspiration  Surgical wound clear dry, no pus  no abx per CCM  Acute blood loss anemia   Hb - 13->7.6->8.2->7.1->6.9->1u PRBC-> 8.3->8.1->7.9  Stool guaiac pending    Close monitoring CBC  Iron panel Fer ok, Fe 27, TIBC 172   On iron supplement  Hypotension  Off Levophed  Stable now . SBP goal 110-180 . Long-term BP goal  normotensive  Hyperlipidemia  Home meds:  pravachol 40, fish oil, fenofibrate  LDL 70, goal <  70  On Crestor 20  Continue statin on discharge  Dysphagia . Secondary to stroke . NPO . on TF @ 40 . IVF NS @ 40 . 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 F/u in 6 mos. 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   Repeat in 6 months  Other Stroke Risk Factors  Former Cigarette smoker, quit 13 yrs ago  Substance abuse - UDS:  THC POSITIVE, Will advise to stop using due to stroke risk.  Hx stroke/TIA  06/2009 - midbrain and L PCA territory infarcts. Small vessel disease.    Other Active Problems  Hx skew deviation since childhood, worsened post stroke  Hypokalemia 2.7-3.6->3.4->3.8->2.8->3.3->3.1 supplement  R thyroid nodule. Nonemergent [place] recommended  Depression on Effexor  Hospital day # 6  This patient is critically ill and at significant risk of neurological worsening, death and care requires constant monitoring of vital signs, hemodynamics,respiratory and cardiac monitoring, extensive review of multiple databases, frequent neurological assessment, discussion with family, other specialists and medical decision making of high complexity. I spent 35 minutes of neurocritical care time  in the care of  this patient.   Marvel Plan, MD PhD Stroke Neurology 11/20/2019 8:43 AM  To contact Stroke Continuity provider, please refer to [email or web address]. After hours, contact General Neurology

## 2019-11-20 NOTE — Evaluation (Signed)
Occupational Therapy Evaluation Patient Details Name: Paula Kim MRN: [id number] DOB: 03-Dec-1955 Today's Date: 11/20/2019    History of Present Illness Pt is 64 y/o  found hemiplegic on the morning of 2/18. Imagin revealed large R MCA territory infarct. Further workup revealed small chronic cortically based infarcts within the left parietooccipital lobes Underwent crani 2/18 (no bone flap present). 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   Clinical Impression   Pt presents with above diagnoses, unsure of PLOF due to pt inability to report given intubation. At time of eval, pt following simple one step commands on R side of body. No active motion of LUE noted, but does have associated reaction with R. Pt not able to sustain eyes open other than to automatic reactions (used purewick). When eyes were open pt was not actively tracking. Pt with tendency to turn head to the L, repositioned to midline and sat up in bed. Given current status, will need to continue to assess as extubation happens and sedation is lessened to get true picture of level of function. Will continue to follow per POC listed below.  Vent settings: 30% FiO2, 5 PEEP, VSS t/o session.    Follow Up Recommendations  Other (comment)(Will continue to assess as extubation follows and sedation is lessened)    Equipment Recommendations  Other (comment)(TBD)    Recommendations for Other Services       Precautions / Restrictions Precautions Precautions: Fall Precaution Comments: no bone flap R side Restrictions Weight Bearing Restrictions: No      Mobility Bed Mobility               General bed mobility comments: pt trunk raised in bed, positioned with pillow to fixate neck/head at midline. Pt not at the level of alertness for EOB/OOB  Transfers                      Balance                                            ADL either performed or assessed with clinical judgement   ADL                                         General ADL Comments: pt is currently total care for all BADLs     Vision   Vision Assessment?: Vision impaired- to be further tested in functional context Additional Comments: suspect visual impairments. L eye deviation from childhood per chart review. Pt keeping eyes closed majority of session, not purposefully tracking when eyes were opened     Perception     Praxis      Pertinent Vitals/Pain Pain Assessment: Faces Faces Pain Scale: No hurt     Hand Dominance     Extremity/Trunk Assessment Upper Extremity Assessment Upper Extremity Assessment: Difficult to assess due to impaired cognition;RUE deficits/detail;LUE deficits/detail RUE Deficits / Details: active movement and following one step commands with RUE LUE Deficits / Details: no active movement noted   Lower Extremity Assessment Lower Extremity Assessment: Defer to PT evaluation       Communication Communication Communication: Other (comment)(ETT)   Cognition Arousal/Alertness: Lethargic;Suspect due to medications Behavior During Therapy:  Flat affect Overall Cognitive Status: Difficult to assess                                 General Comments: pt able to follow simple one step commands with LUE. Eyes closed most of session. Opened briefly with initation of automatic functions (used purewick)   General Comments       Exercises     Shoulder Instructions      Home Living Family/patient expects to be discharged to:: Private residence                                 Additional Comments: unsure of PLOF considering pt is intubated and unable to respond at this time      Prior Functioning/Environment                   OT Problem List: Decreased strength;Impaired vision/perception;Decreased knowledge of use of DME or AE;Impaired  tone;Decreased range of motion;Decreased coordination;Decreased knowledge of precautions;Decreased activity tolerance;Decreased cognition;Cardiopulmonary status limiting activity;Impaired UE functional use;Impaired balance (sitting and/or standing);Decreased safety awareness;Impaired sensation      OT Treatment/Interventions: Self-care/ADL training;Therapeutic exercise;Patient/family education;Balance training;Neuromuscular education;Energy conservation;Therapeutic activities;Cognitive remediation/compensation;DME and/or AE instruction    OT Goals(Current goals can be found in the care plan section) Acute Rehab OT Goals OT Goal Formulation: Patient unable to participate in goal setting Time For Goal Achievement: 12/04/19 Potential to Achieve Goals: Good  OT Frequency: Min 2X/week   Barriers to D/C:            Co-evaluation PT/OT/SLP Co-Evaluation/Treatment: Yes Reason for Co-Treatment: Complexity of the patient's impairments (multi-system involvement);Necessary to address cognition/behavior during functional activity;For patient/therapist safety;To address functional/ADL transfers PT goals addressed during session: Mobility/safety with mobility;Strengthening/ROM OT goals addressed during session: ADL's and self-care;Strengthening/ROM      AM-PAC OT "6 Clicks" Daily Activity     Outcome Measure Help from another person eating meals?: Total Help from another person taking care of personal grooming?: Total Help from another person toileting, which includes using toliet, bedpan, or urinal?: Total Help from another person bathing (including washing, rinsing, drying)?: Total Help from another person to put on and taking off regular upper body clothing?: Total Help from another person to put on and taking off regular lower body clothing?: Total 6 Click Score: 6   End of Session Nurse Communication: Mobility status  Activity Tolerance: Patient limited by lethargy Patient left: in bed;with  call bell/phone within reach  OT Visit Diagnosis: Other abnormalities of gait and mobility (R26.89);Muscle weakness (generalized) (M62.81);Other symptoms and signs involving cognitive function;Hemiplegia and hemiparesis Hemiplegia - Right/Left: Left Hemiplegia - caused by: Cerebral infarction                Time: [id number] OT Time Calculation (min): 22 min Charges:  OT General Charges $OT Visit: 1 Visit OT Evaluation $OT Eval High Complexity: 1 High  Dalphine Handing, MSOT, OTR/L Acute Rehabilitation Services [facility] Office Number: [phone] Pager: [phone]  Dalphine Handing 11/20/2019, 2:45 PM

## 2019-11-20 NOTE — Progress Notes (Signed)
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  On imaging she already had a large right MCA territory infarct.  She underwent a decompressive craniectomy on the evening of 2/18.   History of present illness   Unfortunately we have very little history on this 72 year old.  A friend tried to call her on the morning of admission, as she usually does at 6 AM, and when she was unable to awaken the patient went to visit and found her unresponsive.  She was brought to our department of emergency medicine where a CT and CTA were performed.  She has a large right MCA infarct and an M1 occlusion.  She was not a candidate for either thrombolytics or thrombectomy and she was taken to the operating room for craniectomy on the evening of 2/18.    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 Past surgical history is remarkable for a laparoscopic cholecystectomy.  Significant Hospital Events   Admission 11/08/2019.  Decompressive craniectomy 2/18  Consults:  Neurosurgery   Procedures:  Decompressive craniectomy 2/18  Significant Diagnostic Tests:  CT and CTA head 2/19 >> showing a large right MCA infarct and M1 occlusion  CT head 2/21 >> 1. Stable no new intracranial abnormality.  EEG 2/22 >> left anterior temporal sharp waves with epileptogenicity and no overt seizures  CT head 2/23 >> continued interval evolution of large right MCA territory infarct without hemorrhage, slight increase in parenchymal edema through the craniectomy defect, similar 4 to 5 mm right to left midline shift  Micro Data:  COVID 2/18 > Negative MRSA PCR 2/18 >  Negative Blood 2/22 > Urine 2/22 >   Antimicrobials:  None   Interim history/subjective:  Started on fentanyl infusion over the last 24 hours, remains on Precedex 1.0 Some thicker respiratory secretions reported by RN T-max 100.6   Objective   Blood pressure (!) 92/49, pulse 80, temperature (!) 100.6 F (38.1 C), temperature source Axillary, resp. rate 18, height 5\' 1"  (1.549 m), weight 68.6 kg, SpO2 100 %. CVP:  [13 mmHg-16 mmHg] 16 mmHg  Vent Mode: PSV;CPAP FiO2 (%):  [30 %] 30 % Set Rate:  [16 bmp] 16 bmp Vt Set:  [380 mL] 380 mL PEEP:  [5 cmH20] 5 cmH20 Pressure Support:  [5 cmH20] 5 cmH20 Plateau Pressure:  [15 cmH20] 15 cmH20   Intake/Output Summary (Last 24 hours) at 11/20/2019 0810 Last data filed at 11/20/2019 0700 Gross per 24 hour  Intake 3153 ml  Output 1050 ml  Net 2103 ml   Filed Weights   11/02/2019 2034 11/19/19 0500 11/20/19 0500  Weight: 67 kg 66.3 kg 68.6 kg    Examination: General: Critically ill-appearing woman, ventilated HEENT: Craniectomy incision clean and dry, ET tube in place, large tongue slightly protruding, some oral secretions, moderate ET secretions Neuro: Spontaneously moves her right upper and lower extremities, did not track, did not follow commands today CV: Regular, distant, no murmur PULM: Coarse bilaterally, tolerating PS 5 currently GI: Nondistended, hypoactive bowel sounds Extremities: Trace lower extremity edema Skin: No rash  Assessment & Plan:  This is a 64 year old who is suffered from a large right MCA  territory infarct and underwent decompressive craniotomy on 11-22-2019.  Acute Hypoxemic Respiratory Failure -Due to large right MCA infarct resulting in coma  P: Continued difficulty with SBT.  Tolerates PS 5 as long as she is on adequate sedation, then tachypnea and tachycardia when sedation lightened She may ultimately require tracheostomy to facilitate ventilator weaning, sedation weaning given her neurological  status Follow chest x-ray and obtain respiratory culture 2/24 VAP prevention bundle in place  Large R MCA infarct w/ petechial hemorrhage R parietal  Cerebral edema  Epileptiform activity on EEG 2/22 without overt seizures -s/p decompressive hemicrani Nov 22, 2019 - source unclear P: Appreciate neurology management Continue to maintain euthermia, euglycemia, eunatremia, normoxia Note Keppra increased on 2/22 Hypertonic saline discontinued, following sodium, 156 > 154 > 152 this morning 2/24  Circulatory Shock - suspect from sedation, possibly some vasoplegia.  P: Norepinephrine weaned to off Continue surveillance for any evidence of active infection, consider respiratory.  Existing cultures negative so far, send respiratory culture on 2/24.  Continue follow-up antibiotics  Anemia of critical disease, no clear source for acute blood loss Received PRBC morning 2/23 Follow CBC Conservative transfusion strategy, hemoglobin goal 7.0   Acute metabolic encephalopathy. Suspect multifactorial from delirium, sedating medications and acute stroke. Depression P: Agitated delirium has been an issue, barrier to ventilator weaning.  Continue try to wean Precedex and fentanyl infusion as able Add low-dose clonazepam 2/24 Continue venlafaxine as ordered  At risk malnutrition due to inadequate oral intake P: Tube feeding, protein supplementation as ordered   Best practice:  Diet: tube feeds Pain/Anxiety/Delirium protocol (if indicated): precedex and fentanyl  VAP protocol (if indicated): In place DVT prophylaxis: SCDs only due bleeding risk  GI prophylaxis: Pepcid Glucose control: None required Mobility: Bed rest Code Status: Full Family Communication: Attempted to call niece 2/23, no answer.  Disposition: maintain ICU  Labs   CBC: Recent Labs  Lab 11/22/19 1200 2019/11/22 2112 11/17/19 0349 11/17/19 0349 11/18/19 0306 11/19/19 0450 11/19/19 1640 11/19/19 1815 11/20/19 0238   WBC 11.1*   < > 14.8*  --  7.6 6.6  --  9.9 10.0  NEUTROABS 9.5*  --   --   --   --   --   --  7.0  --   HGB 13.0   < > 8.2*   < > 7.1* 6.9* 8.3* 8.1* 7.9*  HCT 38.0   < > 26.9*   < > 23.5* 22.7* 26.5* 25.6* 24.9*  MCV 89.4   < > 98.9  --  98.7 99.6  --  96.2 96.1  PLT 259   < > 201  --  172 168  --  196 194   < > = values in this interval not displayed.    Basic Metabolic Panel: Recent Labs  Lab 11/15/19 0345 11/15/19 1407 11/15/19 1840 11/16/19 0048 11/16/19 0865 11/16/19 0915 11/17/19 0349 11/17/19 0950 11/18/19 0306 11/18/19 0833 11/19/19 0450 11/19/19 0815 11/19/19 1640 11/19/19 1815 11/20/19 0238  NA 147*   < > 155*   < > 157*   < > 160*   < > 158*   < > 154* 154* 154* 152* 152*  K 3.6  --   --   --  3.5   < > 3.4*  --  3.8  --  2.8*  --   --  3.3* 3.1*  CL 116*  --   --   --  125*   < > 126*  --  123*  --  119*  --   --  121* 121*  CO2 20*  --   --   --  25   < > 24  --  26  --  27  --   --  26 24  GLUCOSE 204*  --   --   --  152*   < > 141*  --  106*  --  162*  --   --  145* 127*  BUN 12  --   --   --  22   < > 18  --  25*  --  24*  --   --  22 20  CREATININE 0.80  --   --   --  0.77   < > 0.58  --  0.70  --  0.57  --   --  0.62 0.61  CALCIUM 7.9*  --   --   --  8.1*   < > 8.3*  --  7.9*  --  8.2*  --   --  7.9* 7.6*  MG 2.0  --  2.3  --  2.4  --   --   --   --   --   --   --   --   --   --   PHOS  --   --  2.9  --  1.7*  --  3.0  --   --   --   --   --   --   --   --    < > = values in this interval not displayed.   GFR: Estimated Creatinine Clearance: 63.7 mL/min (by C-G formula based on SCr of 0.61 mg/dL). Recent Labs  Lab 11/18/19 0306 11/19/19 0450 11/19/19 1815 11/20/19 0238  WBC 7.6 6.6 9.9 10.0    Liver Function Tests: Recent Labs  Lab 11-24-2019 1030  AST 29  ALT 18  ALKPHOS 101  BILITOT 1.0  PROT 8.1  ALBUMIN 4.2   No results for input(s): LIPASE, AMYLASE in the last 168 hours. No results for input(s): AMMONIA in the last 168  hours.  ABG    Component Value Date/Time   PHART 7.374 November 24, 2019 2112   PCO2ART 41.0 November 24, 2019 2112   PO2ART 114.0 (H) 11/24/2019 2112   HCO3 23.9 2019-11-24 2112   TCO2 25 2019-11-24 2112   ACIDBASEDEF 1.0 11/24/19 2112   O2SAT 98.0 11/24/19 2112     Coagulation Profile: Recent Labs  Lab November 24, 2019 1030  INR 1.0    Cardiac Enzymes: No results for input(s): CKTOTAL, CKMB, CKMBINDEX, TROPONINI in the last 168 hours.  HbA1C: Hgb A1c MFr Bld  Date/Time Value Ref Range Status  11/15/2019 03:45 AM 5.0 4.8 - 5.6 % Final    Comment:    (NOTE) Pre diabetes:          5.7%-6.4% Diabetes:              >6.4% Glycemic control for   <7.0% adults with diabetes     CBG: Recent Labs  Lab 11/19/19 1146 11/19/19 1516 11/19/19 1931 11/19/19 2306 11/20/19 0258  GLUCAP 135* 142* 109* 132* 129*   CRITICAL CARE Performed by: Leslye Peer  Total critical care time: 31 minutes  Critical care time was exclusive of separately billable procedures and treating other patients.  Critical care was necessary to treat or prevent imminent or life-threatening deterioration.  Critical care was time spent personally by me on the following activities: development of treatment plan with patient and/or surrogate as well as nursing, discussions with  consultants, evaluation of patient's response to treatment, examination of patient, obtaining history from patient or surrogate, ordering and performing treatments and interventions, ordering and review of laboratory studies, ordering and review of radiographic studies, pulse oximetry and re-evaluation of patient's condition.   Levy Pupa, MD, PhD 11/20/2019, 8:10 AM [facility] [phone] or if no answer [phone]

## 2019-11-20 NOTE — Evaluation (Signed)
Physical Therapy Evaluation Patient Details Name: Paula Kim MRN: [id number] DOB: 01-Jan-1956 Today's Date: 11/20/2019   History of Present Illness  Pt is 64 y/o  found hemiplegic on the morning of 2/18. Imagin revealed large R MCA territory infarct. Further workup revealed small chronic cortically based infarcts within the left parietooccipital lobes Underwent crani 2/18 (no bone flap present). 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  Clinical Impression  Pt admitted with above. Pt evaluated on vent, 30% FiO2, 5 PEEP. Pt keeping eyes closed through majority of session but able to follow some right sided one step commands. Pt demonstrating no agitation with stimulation and vital signs remained stable. Session focused on optimal positioning and PROM. Ordered bilateral PRAFO's for pt due to ankle dorsiflexion tightness noted. Repositioned to promote truncal and cervical neutral alignment as pt having tendency for left cervical rotation/flexion. Pt demonstrates decreased arousal, left hemiplegia, and likely neglect. Will need post acute rehab at discharge to address deficits.     Follow Up Recommendations Post Acute Rehab    Equipment Recommendations  Other (TBA)    Recommendations for Other Services       Precautions / Restrictions Precautions Precautions: Fall;Other (comment) Precaution Comments: no bone flap R side, ETT, cortrak Restrictions Weight Bearing Restrictions: No      Mobility  Bed Mobility Overal bed mobility: Needs Assistance             General bed mobility comments: pt trunk raised in bed, positioned with pillow to fixate neck/head at midline. Pt not at the level of alertness for EOB/OOB  Transfers                 General transfer comment: deferred  Ambulation/Gait             General Gait Details: deferred  Stairs            Wheelchair Mobility     Modified Rankin (Stroke Patients Only) Modified Rankin (Stroke Patients Only) Pre-Morbid Rankin Score: No symptoms Modified Rankin: Severe disability     Balance                                             Pertinent Vitals/Pain Pain Assessment: Faces Faces Pain Scale: No hurt    Home Living Family/patient expects to be discharged to:: Private residence Living Arrangements: Alone Available Help at Discharge: Friend(s)             Additional Comments: unsure of PLOF considering pt is intubated and unable to respond at this time    Prior Function Level of Independence: Independent               Hand Dominance        Extremity/Trunk Assessment   Upper Extremity Assessment Upper Extremity Assessment: Defer to OT evaluation RUE Deficits / Details: active movement and following one step commands with RUE LUE Deficits / Details: no active movement noted    Lower Extremity Assessment Lower Extremity Assessment: RLE deficits/detail;LLE deficits/detail RLE Deficits / Details: Able to wiggle toes on command, hip/knee ROM WFL, ankle dorsiflexion limited ~5 degrees from neutral LLE Deficits / Details: Flexor synergy noted otherwise no active movement, ankle dorsiflexion limited ~20 degrees from neutral, hip/knee ROM WFL       Communication   Communication: Other (  comment)(ETT)  Cognition Arousal/Alertness: Lethargic;Suspect due to medications Behavior During Therapy: Flat affect Overall Cognitive Status: Difficult to assess                                 General Comments: pt able to follow simple one step commands with LUE. Eyes closed most of session. Opened briefly with initation of automatic functions (used purewick)      General Comments      Exercises Other Exercises Other Exercises: Bilateral ankle dorsiflexion PROM x 1 minute each   Assessment/Plan    PT Assessment Patient needs continued PT services  PT Problem  List Decreased strength;Decreased range of motion;Decreased activity tolerance;Decreased mobility;Decreased cognition       PT Treatment Interventions Functional mobility training;Therapeutic activities;Therapeutic exercise;Balance training;Patient/family education    PT Goals (Current goals can be found in the Care Plan section)  Acute Rehab PT Goals Patient Stated Goal: unable PT Goal Formulation: Patient unable to participate in goal setting Time For Goal Achievement: 12/04/19 Potential to Achieve Goals: Fair    Frequency Min 2X/week   Barriers to discharge        Co-evaluation PT/OT/SLP Co-Evaluation/Treatment: Yes Reason for Co-Treatment: Complexity of the patient's impairments (multi-system involvement);Necessary to address cognition/behavior during functional activity;For patient/therapist safety PT goals addressed during session: Strengthening/ROM OT goals addressed during session: ADL's and self-care;Strengthening/ROM       AM-PAC PT "6 Clicks" Mobility  Outcome Measure Help needed turning from your back to your side while in a flat bed without using bedrails?: Total Help needed moving from lying on your back to sitting on the side of a flat bed without using bedrails?: Total Help needed moving to and from a bed to a chair (including a wheelchair)?: Total Help needed standing up from a chair using your arms (e.g., wheelchair or bedside chair)?: Total Help needed to walk in hospital room?: Total Help needed climbing 3-5 steps with a railing? : Total 6 Click Score: 6    End of Session Equipment Utilized During Treatment: Other (comment)(ETT) Activity Tolerance: Patient tolerated treatment well(VSS) Patient left: in bed;with call bell/phone within reach;with restraints reapplied Nurse Communication: Mobility status PT Visit Diagnosis: Hemiplegia and hemiparesis;Other abnormalities of gait and mobility (R26.89) Hemiplegia - Right/Left: Left Hemiplegia - caused by:  Cerebral infarction    Time: 2094-7096 PT Time Calculation (min) (ACUTE ONLY): 22 min   Charges:   PT Evaluation $PT Eval High Complexity: 1 High            Lillia Pauls, PT, DPT Acute Rehabilitation Services Pager [phone] Office [phone]   Norval Morton 11/20/2019, 3:14 PM

## 2019-11-20 NOTE — Progress Notes (Signed)
Orthopedic Tech Progress Note Patient Details:  Jossalyn Forgione Aug 05, 1956 [id number]  Ortho Devices Type of Ortho Device: Prafo boot/shoe Ortho Device/Splint Location: BILATERAL LEGS Ortho Device/Splint Interventions: Ordered, Application   Post Interventions Patient Tolerated: Well Instructions Provided: Care of device, Adjustment of device   Donald Pore 11/20/2019, 12:04 PM

## 2019-11-21 ENCOUNTER — Inpatient Hospital Stay (HOSPITAL_COMMUNITY): Payer: Self-pay

## 2019-11-21 LAB — BASIC METABOLIC PANEL
Anion gap: 8 (ref 5–15)
BUN: 22 mg/dL (ref 8–23)
CO2: 23 mmol/L (ref 22–32)
Calcium: 8 mg/dL — ABNORMAL LOW (ref 8.9–10.3)
Chloride: 121 mmol/L — ABNORMAL HIGH (ref 98–111)
Creatinine, Ser: 0.8 mg/dL (ref 0.44–1.00)
GFR calc Af Amer: >60 mL/min (ref >60)
GFR calc non Af Amer: >60 mL/min (ref >60)
Glucose, Bld: 144 mg/dL — ABNORMAL HIGH (ref 70–99)
Potassium: 3.3 mmol/L — ABNORMAL LOW (ref 3.5–5.1)
Sodium: 152 mmol/L — ABNORMAL HIGH (ref 135–145)

## 2019-11-21 LAB — CBC
HCT: 24.7 % — ABNORMAL LOW (ref 36.0–46.0)
Hemoglobin: 7.8 g/dL — ABNORMAL LOW (ref 12.0–15.0)
MCH: 31 pg (ref 26.0–34.0)
MCHC: 31.6 g/dL (ref 30.0–36.0)
MCV: 98 fL (ref 80.0–100.0)
Platelets: 188 10*3/uL (ref 150–400)
RBC: 2.52 MIL/uL — ABNORMAL LOW (ref 3.87–5.11)
RDW: 14.3 % (ref 11.5–15.5)
WBC: 8.7 10*3/uL (ref 4.0–10.5)
nRBC: 0.5 % — ABNORMAL HIGH (ref 0.0–0.2)

## 2019-11-21 LAB — GLUCOSE, CAPILLARY
Glucose-Capillary: 129 mg/dL — ABNORMAL HIGH (ref 70–99)
Glucose-Capillary: 130 mg/dL — ABNORMAL HIGH (ref 70–99)
Glucose-Capillary: 144 mg/dL — ABNORMAL HIGH (ref 70–99)
Glucose-Capillary: 128 mg/dL — ABNORMAL HIGH (ref 70–99)
Glucose-Capillary: 163 mg/dL — ABNORMAL HIGH (ref 70–99)
Glucose-Capillary: 126 mg/dL — ABNORMAL HIGH (ref 70–99)

## 2019-11-21 LAB — MAGNESIUM: Magnesium: 2.3 mg/dL (ref 1.7–2.4)

## 2019-11-21 MED ORDER — VITAL AF 1.2 CAL PO LIQD
1000.0000 mL | ORAL | Status: DC
  Administered 2019-11-21 – 2019-11-24 (×3): 1000 mL
  Filled 2019-11-21 (×3): qty 1000

## 2019-11-21 MED ORDER — PRO-STAT SUGAR FREE PO LIQD
30.0000 mL | Freq: Every day | ORAL | Status: DC
  Administered 2019-11-22 – 2019-11-25 (×4): 30 mL
  Filled 2019-11-21 (×4): qty 30

## 2019-11-21 MED ORDER — SODIUM CHLORIDE 0.9 % IV SOLN
1.0000 g | INTRAVENOUS | Status: DC
  Administered 2019-11-21: 09:00:00 1 g via INTRAVENOUS
  Filled 2019-11-21: qty 10
  Filled 2019-11-21: qty 1

## 2019-11-21 MED ORDER — CHLORHEXIDINE GLUCONATE CLOTH 2 % EX PADS
6.0000 | MEDICATED_PAD | Freq: Every day | CUTANEOUS | Status: DC
  Administered 2019-11-22 – 2019-11-25 (×3): 6 via TOPICAL

## 2019-11-21 NOTE — Progress Notes (Signed)
EEG complete - results pending 

## 2019-11-21 NOTE — Progress Notes (Signed)
Nutrition Follow-up  DOCUMENTATION CODES:   Not applicable  INTERVENTION:   Increase Vital AF 1.2 to 55 ml/hr (1320 ml/day) via OG tube Decrease Prostat to 30 ml daily   Provides: 1684 kcal, 114 grams protein, and 1070 ml free water.    NUTRITION DIAGNOSIS:   Inadequate oral intake related to inability to eat as evidenced by NPO status.  GOAL:   Patient will meet greater than or equal to 90% of their needs  MONITOR:   TF tolerance, Vent status  REASON FOR ASSESSMENT:   Ventilator, Rounds    ASSESSMENT:   Pt with PMH of CVA in 2009, HLD, SOB, and depression who is now admitted with large R MCA infarct s/p crani 2/18.   Per CCM will likely need trach placement.   Patient is currently intubated on ventilator support MV: 12.1 L/min Temp (24hrs), Avg:100.1 F (37.8 C), Min:99.3 F (37.4 C), Max:101 F (38.3 C)   Medications reviewed and include: ferrous sulfate, senokot-s   Labs reviewed: Na 152 (H), K+ 3.3 (L)   TF: Vital AF 1.2 @ 40 ml/hr 30 ml Prostat TID Provides: 1452 kcal, 117 grams protein  Diet Order:   Diet Order            Diet NPO time specified  Diet effective now              EDUCATION NEEDS:   Not appropriate for education at this time  Skin:  Skin Assessment: Reviewed RN Assessment  Last BM:  unknown  Height:   Ht Readings from Last 1 Encounters:  11/24/2019 5\' 1"  (1.549 m)    Weight:   Wt Readings from Last 1 Encounters:  11/20/19 68.6 kg    Ideal Body Weight:  47.7 kg  BMI:  Body mass index is 28.58 kg/m.  Estimated Nutritional Needs:   Kcal:  1679  Protein:  100-115 grams  Fluid:  >1.5 L/day  11/22/19., RD, LDN, CNSC See [facility] for contact information

## 2019-11-21 NOTE — Progress Notes (Signed)
Patient ID: Paula Kim, female   DOB: Jun 14, 1956, 64 y.o.   MRN: [id number] BP 127/68   Pulse (!) 51   Temp (!) 100.5 F (38.1 C) (Axillary)   Resp (!) 25   Ht 5\' 1"  (1.549 m)   Wt 68.6 kg   SpO2 98%   BMI 28.58 kg/m  Wounds are clean, dry, without signs of infection.

## 2019-11-21 NOTE — Progress Notes (Signed)
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  On imaging she already had a large right MCA territory infarct.  She underwent a decompressive craniectomy on the evening of 2/18.   History of present illness   Unfortunately we have very little history on this 61 year old.  A friend tried to call her on the morning of admission, as she usually does at 6 AM, and when she was unable to awaken the patient went to visit and found her unresponsive.  She was brought to our department of emergency medicine where a CT and CTA were performed.  She has a large right MCA infarct and an M1 occlusion.  She was not a candidate for either thrombolytics or thrombectomy and she was taken to the operating room for craniectomy on the evening of 2/18.    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 Past surgical history is remarkable for a laparoscopic cholecystectomy.  Significant Hospital Events   Admission 11/24/2019.  Decompressive craniectomy 2/18  Consults:  Neurosurgery   Procedures:  Decompressive craniectomy 2/18  Significant Diagnostic Tests:  CT and CTA head 2/19 >> showing a large right MCA infarct and M1 occlusion  CT head 2/21 >> 1. Stable no new intracranial abnormality.  EEG 2/22 >> left anterior temporal sharp waves with epileptogenicity and no overt seizures  CT head 2/23 >> continued interval evolution of large right MCA territory infarct without hemorrhage, slight increase in parenchymal edema through the craniectomy defect, similar 4 to 5 mm right to left midline shift  Micro Data:  COVID 2/18 > Negative MRSA PCR 2/18 > Negative  Blood 2/22 > Urine 2/22 > Respiratory 2/24 >> GPC in clusters, GPR >>   Antimicrobials:  Ceftriaxone 2/25 >>   Interim history/subjective:  Low-grade temp again 101.0 this morning Fentanyl up to 75, Precedex remains 1.0 Tolerated PSV 5 for most of the day 2/24 but was on sedation  Objective   Blood pressure 106/63, pulse 69, temperature (!) 100.6 F (38.1 C), temperature source Axillary, resp. rate (!) 25, height 5\' 1"  (1.549 m), weight 68.6 kg, SpO2 96 %.    Vent Mode: PRVC FiO2 (%):  [30 %-40 %] 30 % Set Rate:  [16 bmp] 16 bmp Vt Set:  [380 mL] 380 mL PEEP:  [5 cmH20] 5 cmH20 Pressure Support:  [5 cmH20] 5 cmH20 Plateau Pressure:  [10 cmH20-13 cmH20] 13 cmH20   Intake/Output Summary (Last 24 hours) at 11/21/2019 0809 Last data filed at 11/21/2019 0800 Gross per 24 hour  Intake 2503.6 ml  Output 1050 ml  Net 1453.6 ml   Filed Weights   11/02/2019 2034 11/19/19 0500 11/20/19 0500  Weight: 67 kg 66.3 kg 68.6 kg    Examination: General: Critically ill-appearing, ventilated HEENT: Craniectomy clean and dry, ET tube in place, tongue protruding less.  Moderate secretions Neuro: Sedated today.  Wakes to stimulation and intermittently follows commands but then immediately back to sleep.  Fentanyl 75, Precedex 1.0 CV: Regular, no murmur PULM: Coarse bilaterally, no wheezing.  PRVC GI: Nondistended, hypoactive bowel sounds Extremities: Trace lower extremity edema Skin: No rash  Assessment & Plan:  This is a 64 year old  who is suffered from a large right MCA territory infarct and underwent decompressive craniotomy on 11/22/2019.  Acute Hypoxemic Respiratory Failure -Due to large right MCA infarct resulting in coma Possible evolving left lower lobe pneumonia.  Chest x-ray 2/25 reviewed, question retrocardiac infiltrate P: Barriers to extubation include neurological status, encephalopathy, agitation with associated sedation requirement.  Also difficult to assess her airway  protection and bulbar function post stroke.  Suspect she may ultimately require tracheostomy to facilitate ventilator weaning.  Question whether she may be evolving a new pneumonia which is also a barrier. Respiratory cultures pending, start ceftriaxone empirically 2/25 pending culture data Follow chest x-ray VAP prevention bundle is in place  Large R MCA infarct w/ petechial hemorrhage R parietal  Cerebral edema  Epileptiform activity on EEG 2/22 without overt seizures -s/p decompressive hemicrani 11/06/2019 - source unclear P: Appreciate neurology and neurosurgery management Continue to maintain euthermia, euglycemia, eunatremia, normoxia Continue Keppra, increased on 2/22 Hypertonic saline discontinued, allowing sodium to drift down, 152 on 2/25  Circulatory Shock, resolved - suspect from sedation, possibly some vasoplegia.  P: Norepinephrine weaned to off At risk sepsis, treating possible left lower lobe pneumonia as above 2/25  Anemia of critical disease, no clear source for acute blood loss.  Stable 7.8 on 2/25 Received PRBC morning 2/23 Continue to follow CBC Conservative transfusion strategy, hemoglobin goal 7.0   Acute metabolic encephalopathy. Suspect multifactorial from delirium, sedating medications and acute stroke. Depression P: Agitated delirium has been an issue, barrier to ventilator weaning.  Continue to try to wean Precedex and fentanyl infusion as able.  Suspect she will need low-dose fentanyl based on her response to weaning last several days Low-dose clonazepam added on 2/24 Continue venlafaxine as ordered  At risk malnutrition due to inadequate oral intake P: Tube feeding as ordered   Best practice:  Diet: tube feeds Pain/Anxiety/Delirium protocol (if indicated): precedex and fentanyl  VAP protocol (if indicated): In place DVT prophylaxis: SCDs only due bleeding risk  GI prophylaxis: Pepcid Glucose control: None required Mobility: Bed rest Code  Status: Full Family Communication: Attempted to call niece 2/23, no answer.  Disposition: maintain ICU  Labs   CBC: Recent Labs  Lab 11/01/2019 1200 10/31/2019 2112 11/18/19 0306 11/18/19 0306 11/19/19 0450 11/19/19 1640 11/19/19 1815 11/20/19 0238 11/21/19 0519  WBC 11.1*   < > 7.6  --  6.6  --  9.9 10.0 8.7  NEUTROABS 9.5*  --   --   --   --   --  7.0  --   --   HGB 13.0   < > 7.1*   < > 6.9* 8.3* 8.1* 7.9* 7.8*  HCT 38.0   < > 23.5*   < > 22.7* 26.5* 25.6* 24.9* 24.7*  MCV 89.4   < > 98.7  --  99.6  --  96.2 96.1 98.0  PLT 259   < > 172  --  168  --  196 194 188   < > = values in this interval not displayed.    Basic Metabolic Panel: Recent Labs  Lab 11/15/19 0345 11/15/19 1407 11/15/19 1840 11/16/19 0048 11/16/19 9323 11/16/19 0915 11/17/19 0349 11/17/19 0950 11/18/19 0306 11/18/19 0833 11/19/19 0450 11/19/19 0450 11/19/19 0815 11/19/19 1640 11/19/19 1815 11/20/19 0238 11/21/19 0519  NA 147*   < > 155*   < > 157*   < > 160*   < > 158*   < > 154*   < > 154* 154* 152* 152* 152*  K 3.6  --   --   --  3.5   < > 3.4*  --  3.8  --  2.8*  --   --   --  3.3* 3.1* 3.3*  CL 116*  --   --   --  125*   < > 126*  --  123*  --  119*  --   --   --  121* 121* 121*  CO2 20*  --   --   --  25   < > 24  --  26  --  27  --   --   --  26 24 23   GLUCOSE 204*  --   --   --  152*   < > 141*  --  106*  --  162*  --   --   --  145* 127* 144*  BUN 12  --   --   --  22   < > 18  --  25*  --  24*  --   --   --  22 20 22   CREATININE 0.80  --   --   --  0.77   < > 0.58  --  0.70  --  0.57  --   --   --  0.62 0.61 0.80  CALCIUM 7.9*  --   --   --  8.1*   < > 8.3*  --  7.9*  --  8.2*  --   --   --  7.9* 7.6* 8.0*  MG 2.0  --  2.3  --  2.4  --   --   --   --   --   --   --   --   --   --   --  2.3  PHOS  --   --  2.9  --  1.7*  --  3.0  --   --   --   --   --   --   --   --   --   --    < > = values in this interval not displayed.   GFR: Estimated Creatinine Clearance: 63.7 mL/min (by C-G  formula based on SCr of 0.8 mg/dL). Recent Labs  Lab 11/19/19 0450 11/19/19 1815 11/20/19 0238 11/21/19 0519  WBC 6.6 9.9 10.0 8.7    Liver Function Tests: Recent Labs  Lab 28-Nov-2019 1030  AST 29  ALT 18  ALKPHOS 101  BILITOT 1.0  PROT 8.1  ALBUMIN 4.2   No results for input(s): LIPASE, AMYLASE in the last 168 hours. No results for input(s): AMMONIA in the last 168 hours.  ABG    Component Value Date/Time   PHART 7.374 11/28/2019 2112   PCO2ART 41.0 Nov 28, 2019 2112   PO2ART 114.0 (H) 2019/11/28 2112   HCO3 23.9 11-28-2019 2112   TCO2 25 Nov 28, 2019 2112   ACIDBASEDEF 1.0 11-28-19 2112   O2SAT 98.0 2019-11-28 2112     Coagulation Profile: Recent Labs  Lab 2019-11-28 1030  INR 1.0    Cardiac Enzymes: No results for input(s): CKTOTAL, CKMB, CKMBINDEX, TROPONINI in the last 168 hours.  HbA1C: Hgb A1c MFr Bld  Date/Time Value Ref Range Status  11/15/2019 03:45 AM 5.0 4.8 - 5.6 % Final    Comment:    (NOTE) Pre diabetes:          5.7%-6.4% Diabetes:              >6.4% Glycemic control  for   <7.0% adults with diabetes     CBG: Recent Labs  Lab 11/20/19 1514 11/20/19 1918 11/20/19 2255 11/21/19 0308 11/21/19 0750  GLUCAP 124* 147* 138* 129* 130*   CRITICAL CARE Performed by: Collene Gobble  Total critical care time: 32 minutes  Critical care time was exclusive of separately billable procedures and treating other patients.  Critical care was necessary to treat or prevent imminent or life-threatening deterioration.  Critical care was time spent personally by me on the following activities: development of treatment plan with patient and/or surrogate as well as nursing, discussions with consultants, evaluation of patient's response to treatment, examination of patient, obtaining history from patient or surrogate, ordering and performing treatments and interventions, ordering and review of laboratory studies, ordering and review of radiographic studies,  pulse oximetry and re-evaluation of patient's condition.   Baltazar Apo, MD, PhD 11/21/2019, 8:09 AM [facility] [phone] or if no answer [phone]

## 2019-11-21 NOTE — Progress Notes (Signed)
STROKE TEAM PROGRESS NOTE   INTERVAL HISTORY Pt still intubated and on weaning. Discussed with Dr. Delton Coombes, pt not extubatable and may likely need trach. She is able to open eyes on voice today but barely following commands on the right. EEG pending. Na 152, on NS. Hb stable.   Vitals:   11/21/19 0900 11/21/19 1000 11/21/19 1100 11/21/19 1101  BP: 118/69 128/71 110/67 110/67  Pulse: 72 81 64 73  Resp: 18 (!) 23 (!) 21 (!) 21  Temp:      TempSrc:      SpO2: 96% 97% 97% 96%  Weight:      Height:        CBC:  Recent Labs  Lab 11/21/2019 1200 11/02/2019 2112 11/19/19 1815 11/19/19 1815 11/20/19 0238 11/21/19 0519  WBC 11.1*   < > 9.9   < > 10.0 8.7  NEUTROABS 9.5*  --  7.0  --   --   --   HGB 13.0   < > 8.1*   < > 7.9* 7.8*  HCT 38.0   < > 25.6*   < > 24.9* 24.7*  MCV 89.4   < > 96.2   < > 96.1 98.0  PLT 259   < > 196   < > 194 188   < > = values in this interval not displayed.    Basic Metabolic Panel:  Recent Labs  Lab 11/16/19 0608 11/16/19 0915 11/17/19 0349 11/17/19 0950 11/20/19 0238 11/21/19 0519  NA 157*   < > 160*   < > 152* 152*  K 3.5   < > 3.4*   < > 3.1* 3.3*  CL 125*   < > 126*   < > 121* 121*  CO2 25   < > 24   < > 24 23  GLUCOSE 152*   < > 141*   < > 127* 144*  BUN 22   < > 18   < > 20 22  CREATININE 0.77   < > 0.58   < > 0.61 0.80  CALCIUM 8.1*   < > 8.3*   < > 7.6* 8.0*  MG 2.4  --   --   --   --  2.3  PHOS 1.7*  --  3.0  --   --   --    < > = values in this interval not displayed.   Lipid Panel:     Component Value Date/Time   CHOL 125 11/15/2019 0345   TRIG 151 (H) 11/15/2019 0345   HDL 25 (L) 11/15/2019 0345   CHOLHDL 5.0 11/15/2019 0345   VLDL 30 11/15/2019 0345   LDLCALC 70 11/15/2019 0345   HgbA1c:  Lab Results  Component Value Date   HGBA1C 5.0 11/15/2019   Urine Drug Screen:     Component Value Date/Time   LABOPIA NONE DETECTED 10/30/2019 1315   COCAINSCRNUR NONE DETECTED 11/22/2019 1315   LABBENZ NONE DETECTED 11/06/2019  1315   AMPHETMU NONE DETECTED 11/03/2019 1315   THCU POSITIVE (A) 11/15/2019 1315   LABBARB NONE DETECTED 11/22/2019 1315    Alcohol Level     Component Value Date/Time   ETH <10 11/23/2019 1030    IMAGING  CT head:  11/19/2019   1. Extensive abnormal hypodensity throughout much of the right MCA vascular territory consistent with acute/early subacute ischemic infarction. No evidence of hemorrhagic conversion. No significant mass effect at this time.  2. Background chronic ischemic changes as detailed  3. Mild generalized  parenchymal atrophy.  4. Air-fluid level and frothy secretions within the left maxillary sinus. Correlate for acute sinusitis.   CT C-SPINE NO CHARGE 2019/11/30 IMPRESSION:  1. No acute fracture or dislocation of the cervical spine.  2. Multilevel degenerative disc disease and facet arthropathy.  3. Thoracic aortic aneurysm. Please refer to forthcoming CT angiogram report for full evaluation of the vascular findings.  4. 1.8 cm right thyroid lobe nodule. Dedicated thyroid ultrasound is recommended on a nonemergent basis.  5. Centrilobular emphysema. Emphysema (ICD10-J43.9).   CTA head:  11-30-19   1. 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 No significant reconstitution of the right M1 MCA more distally. There is minimal reconstitution of right M2 and more distal right MCA branches.  2. Posterior circulation atherosclerotic stenoses. Most notably, there is focal near occlusive stenosis versus focal occlusion of the distal left vertebral artery just proximal to the vertebrobasilar junction. Moderate to moderately severe stenoses within the mid P2 posterior cerebral arteries bilaterally.  3. Mild atherosclerotic plaque within the intracranial internal carotid arteries.   CTA neck:  Nov 30, 2019   1. The bilateral common carotid, internal carotid and vertebral arteries are patent within the neck without significant stenosis (50% or greater).  2. Mixed  plaque within the carotid bifurcations and proximal internal carotid arteries bilaterally. There is possible ulcerated plaque within the right carotid bulb. There is lobular dilation of the bilateral carotid bulbs. Additionally, there is significant tortuosity of the proximal left ICA.  3. 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 Please correlate with findings on concurrently performed CTA chest/abdomen/pelvis.  4. 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  5. 1.8 cm nodule within the right thyroid lobe. Nonemergent dedicated thyroid ultrasound is recommended for further evaluation, as clinically appropriate.  ADDENDUM: The presence of a thoracic aortic aneurysm, lobular dilation of both carotid bulbs and 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   CT perfusion head 2019-11-30    The perfusion software identifies a 122 mL core infarct within the right MCA vascular territory. The perfusion software identifies a 139 mL region of critically hypoperfused parenchyma within the right MCA vascular territory. Reported mismatch volume 17 mL.   MR BRAIN WO CONTRAST November 30, 2019 IMPRESSION:  1. Motion degraded exam.  2. Acute ischemic infarction involving much of the right MCA vascular territory. Only mild mass effect at this time. Mild petechial hemorrhage within portions of the right parietal infarction territory.  3. Redemonstrated small chronic cortically based infarcts within the left parietooccipital lobes.  4. Moderate/advanced chronic small vessel ischemic disease with multiple chronic lacunar infarcts, progressed from MRI 07/08/2009.  5. Mild generalized parenchymal atrophy.  6. Air-fluid level and frothy secretions within the left maxillary sinus. Correlate for acute sinusitis.  CT HEAD WO CONTRAST 11/15/2019 IMPRESSION:  1. Large right MCA infarct with  no hemorrhagic transformation.  2. Status post right hemi-craniectomy with mild intracranial mass effect. 5 mm of leftward midline shift. Patent basilar cisterns.  3. Underlying chronic small vessel disease.  CT HEAD WO CONTRAST 11/17/2019 1. Stable since 11/15/2019. Large right MCA infarct with no malignant hemorrhagic transformation. Stable 5 mm of leftward midline shift. 2. No new intracranial abnormality.   CT HEAD WO CONTRAST 11/19/2019 1. Continued interval evolution of large right MCA territory infarct without evidence for hemorrhagic transformation. Slight interval increase in swelling of brain parenchyma through the craniectomy defect, but with similar  4-5 mm right-to-left midline shift. 2. No other new acute intracranial abnormality.   CT Angio Chest/Abd/Pel for Dissection W and/or W/WO 11/11/2019 1. 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 No dissection or intramural hematoma.  2. 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 Recommend followup by abdomen and pelvis CTA in 6 months, and vascular surgery referral/consultation if not already obtained. This recommendation follows ACR consensus guidelines: White Paper of the ACR Incidental Findings Committee II on Vascular Findings. J Am Coll Radiol 2013; 10:789-794. Aortic aneurysm NOS (ICD10-I71.9)  3. 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  4. Cirrhotic changes involving the liver. No worrisome hepatic lesions.  5. Emphysematous changes and pulmonary scarring but no acute pulmonary findings.  6. 17 mm right thyroid nodule. Recommend thyroid US and potential biopsy (ref: J Am Coll Radiol. 2015 Feb;12(2): 143-50).  7. Aortic Atherosclerosis (ICD10-I70.0) and Emphysema (ICD10-J43.9).   DG CHEST PORT 1 VIEW 11/22/2019 pending  11/21/2019 Persistent basilar  opacities, favor atelectasis though more confluent opacity in the retrocardiac space could reflect airspace disease. 11/18/2019 1. Unchanged retrocardiac opacity with volume loss, may be atelectasis, pneumonia or aspiration. Perihilar atelectasis on the right. 2. Stable support apparatus allowing for patient rotation.  11/17/2019 Retrocardiac opacity with volume loss suggesting atelectasis. Given the history, superimposed infection is not excluded. Unremarkable hardware 11/06/2019 No active disease.  Aortic atherosclerosis, tortuosity and ectasia.  11/07/2019 1.  Lungs clear.  2. Prominence of the descending aorta with questionable degree of aneurysmal dilatation distally.   ECHOCARDIOGRAM COMPLETE 11/21/2019 1. Left ventricular ejection fraction, by estimation, is 60 to 65%. The left ventricle has normal function. The left ventricle has no regional wall motion abnormalities. Indeterminate diastolic filling due to E-A fusion.   2. Right ventricular systolic function is normal. The right ventricular size is normal. Tricuspid regurgitation signal is inadequate for assessing PA pressure.   3. The mitral valve is normal in structure and function. No evidence of mitral valve regurgitation. No evidence of mitral stenosis.   4. The aortic valve is tricuspid. Aortic valve regurgitation is not visualized. Mild aortic valve sclerosis is present, with no evidence of aortic valve stenosis.   5. The inferior vena cava is normal in size with greater than 50% respiratory variability, suggesting right atrial pressure of 3 mmHg.   6. Technically difficult study with poor acoustic windows.   EEG adult 11/18/2019 This technically difficult study showed evidence of epileptogenicity in left anterior temporal region as well as moderate to severe diffuse encephalopathy, non specific to etiology. No seizures  were seen throughout the recording. If concern for ictal and interictal activity persists, please consider repeat  prolonged study.   ECG - ST rate 109 BPM. (See cardiology reading for complete details)  PHYSICAL EXAM    Temp:  [99.3 F (37.4 C)-100.6 F (38.1 C)] 100.6 F (38.1 C) (02/25 0400) Pulse Rate:  [64-120] 73 (02/25 1101) Resp:  [18-37] 21 (02/25 1101) BP: (93-168)/(50-130) 110/67 (02/25 1101) SpO2:  [88 %-100 %] 96 % (02/25 1101) FiO2 (%):  [30 %-40 %] 30 % (02/25 1102)  General - Well nourished, well developed, intubated on precedex and fentanyl.  Ophthalmologic - fundi not visualized due to noncooperation.  Cardiovascular - Regular rate and rhythm without stimulation  Neuro - intubated on precedex and fentanyl, eyes open on voice, barely following limited simple commands on the  right hand (showing thumb) and right foot (weakly wiggled toes). With eye opening, eyes right gaze preference, not cross midline, no nystagmus. Not blinking to visual threat bilaterally, PERRL. Corneal reflex present, gag and cough present. Breathing over the vent.  Facial symmetry not able to test due to ET tube.  Tongue protrusion not cooperative. RUE and RLE spontaneous movement, but not against gravity. On pain stimulation, LUE 0/5 and LLE slight withdraw. DTR 1+ and positive babinski bilaterally. Sensation, coordination and gait not tested.   ASSESSMENT/PLAN Ms. Paula Kim is a 64 y.o. female with history of HLD found down presenting with L sided weakness and dysarthria.  Stroke:   Large R MCA infarct w/ petechial hemorrhage R parietal s/p decompressive hemicrani - source unclear, ? large vessel vasculopathy  CT head extensive hypodensity R MCA territory w/ infarct.  CTA head proximal R M1 occlusion. Posterior circulation atherosclerosis - L VA stenosis vs focal occlusion. B mid P2 PCA moderate to severe stenoses.  CTA neck mixed plaque bifurcations and proximal B ICAs. Possible ulcerated plaque R ICA bulb. Lobar dilation B ICA bulbs. L ICA tortuous. Aortic arch and descending thoracic aorta  aneurysm. Proximal L subclavian pseudoaneurysm.   CT perfusion positive for penumbra  MRI  R MCA infarct w/ R parietal petechial hemorrhage. Chronic L parietooccipital infarcts.   CT repeat 2/21 - stable 30mm MLS  CT repeat 2/23 - slight interval increase in brain swelling through crani, interval evolution, stable 4 to 5 mm MLS  LE venous doppler - negative for DVT bilaterally  2D Echo EF 60-65%. No source of embolus   Consider 30 day cardiac event monitoring as outpt  LDL 70  HgbA1c 5.0  Heparin 5000 units sq tid for VTE prophylaxis  aspirin 325 mg daily prior to admission, now on aspirin 325 mg daily.   Therapy recommendations:  Pending. Reorder when able to evaluate  Disposition:  pending   Acute Respiratory Failure  Intubated  On weaning today  Sedated with precedex and fentanyl  Klonopin added 0.25 bid  CCM onboard  Not candidate for extubation at this time  Will likely require trach  Cerebral Edema  S/p Right decompression hemicrani by Cabbell on 11/21/2019  CT 2/21 stable 58mm MLS  CT repeat 2/23 - slight interval increase in brain swelling through crani, interval evolution, stable 4 to 5 mm MLS  On Keppra for seizure prevention  On 3% saline -> off -> NS @40  -> 1/2NS @40  ->NS @40   Na 140->...>154->154->152->152->152  Sodium goal 150-160    Seizure like activity  Stimulation induced increased muscle tone at LUE and tonic posturing of RUE and RLE.   EEG left frontal sharp waves  On keppra 500 -> increase to 1000 bid  Repeat EEG 2/25 pending   Fever   Tmax 101.2->100.6->101  On ice pack  Mild Leukocytosis, - 11.1->14.8->7.6->6.6->9.9->10->8.7  Blood culture 2/22 no growth 2 days   UA neg   CXR unchanged - atx, pna or aspiration  Surgical wound clear dry, no pus  Resp cx GPC in clusters, GPR  On rocephin 2/25>>  Acute blood loss anemia, critical illness   Hb - 13->7.6->8.2->7.1->6.9->1u PRBC-> 8.3->8.1->7.9->7.8  Stool  guaiac pending    Close monitoring CBC  Iron panel Fer ok, Fe 27, TIBC 172   On iron supplement  Hypotension  Off Levophed  Stable on the lower end . SBP goal 110-180 . Long-term BP goal normotensive  Hyperlipidemia  Home meds:  pravachol 40, fish oil, fenofibrate  LDL 70, goal < 70  On Crestor 20  Continue statin on discharge  Dysphagia At risk malnutrition . Secondary to stroke . NPO . on TF @ 40 . IVF NS @ 40 . 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 F/u in 6 mos. 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   Repeat in 6 months  Other Stroke Risk Factors  Former Cigarette smoker, quit 13 yrs ago  Substance abuse - UDS:  THC POSITIVE, Will advise to stop using due to stroke risk.  Hx stroke/TIA  06/2009 - midbrain and L PCA territory infarcts. Small vessel disease.    Other Active Problems  Hx skew deviation since childhood, worsened post stroke  Hypokalemia 2.7-3.6->3.4->3.8->2.8->3.3->3.1->3.3 supplement  R thyroid nodule. Nonemergent [place] recommended  Depression on Effexor  Hospital day # 7  This patient is critically ill and at significant risk of neurological worsening, death and care requires constant monitoring of vital signs, hemodynamics,respiratory and cardiac monitoring, extensive review of multiple databases, frequent neurological assessment, discussion with family, other specialists and medical decision making of high complexity. I spent 35 minutes of neurocritical care time  in the care of  this patient. I discussed with Dr. Delton Coombes.  Marvel Plan, MD PhD Stroke Neurology 11/21/2019 11:37 AM  To contact Stroke Continuity provider, please refer to [email or web address]. After hours, contact General Neurology

## 2019-11-21 NOTE — Procedures (Signed)
Patient Name: Paula Kim  MRN: [id number]  Epilepsy Attending: Charlsie Quest  Referring Physician/Provider: Dr Marvel Plan Date: 11/21/2019 Duration: 24.51 mins  Patient history: 63yo F with acute large right MCA infarct. EEG to evaluate for seizures.   Level of alertness: awake, lethargic  AEDs during EEG study: LEV  Technical aspects: This EEG study was done with scalp electrodes positioned according to the 10-20 International system of electrode placement. Electrical activity was acquired at a sampling rate of 500Hz  and reviewed with a high frequency filter of 70Hz  and a low frequency filter of 1Hz . EEG data were recorded continuously and digitally stored.   DESCRIPTION: No clear posterior dominant rhythm was seen. EEG showed continuous generalized and lateralized right hemisphere 3-6hz  theta-delta slowing. Sharp waves were seen frequently in left anterior temporal region, maxima F7/T7. Hyperventilation and photic stimulation were not performed.  ABNORMALITY - Continuous slow, generalized and lateralized right hemisphere - Sharp waves, left anterior temporal region  IMPRESSION: This study showed evidence of epileptogenicity in left anterior temporal region.  There is also evidence of cortical dysfunction in right hemisphere likely secondary to underlying infarct.  Additionally, there is evidence of severe diffuse encephalopathy, non specific to etiology. No seizures  were seen throughout the recording.    Wendelin Reader 

## 2019-11-22 ENCOUNTER — Inpatient Hospital Stay (HOSPITAL_COMMUNITY): Payer: Self-pay

## 2019-11-22 LAB — BASIC METABOLIC PANEL
Anion gap: 10 (ref 5–15)
BUN: 21 mg/dL (ref 8–23)
CO2: 22 mmol/L (ref 22–32)
Calcium: 7.6 mg/dL — ABNORMAL LOW (ref 8.9–10.3)
Chloride: 119 mmol/L — ABNORMAL HIGH (ref 98–111)
Creatinine, Ser: 0.55 mg/dL (ref 0.44–1.00)
GFR calc Af Amer: >60 mL/min (ref >60)
GFR calc non Af Amer: >60 mL/min (ref >60)
Glucose, Bld: 176 mg/dL — ABNORMAL HIGH (ref 70–99)
Potassium: 2.8 mmol/L — ABNORMAL LOW (ref 3.5–5.1)
Sodium: 151 mmol/L — ABNORMAL HIGH (ref 135–145)

## 2019-11-22 LAB — GLUCOSE, CAPILLARY
Glucose-Capillary: 140 mg/dL — ABNORMAL HIGH (ref 70–99)
Glucose-Capillary: 132 mg/dL — ABNORMAL HIGH (ref 70–99)
Glucose-Capillary: 151 mg/dL — ABNORMAL HIGH (ref 70–99)
Glucose-Capillary: 140 mg/dL — ABNORMAL HIGH (ref 70–99)
Glucose-Capillary: 141 mg/dL — ABNORMAL HIGH (ref 70–99)

## 2019-11-22 LAB — CBC
HCT: 23.8 % — ABNORMAL LOW (ref 36.0–46.0)
Hemoglobin: 7.4 g/dL — ABNORMAL LOW (ref 12.0–15.0)
MCH: 30.5 pg (ref 26.0–34.0)
MCHC: 31.1 g/dL (ref 30.0–36.0)
MCV: 97.9 fL (ref 80.0–100.0)
Platelets: 200 10*3/uL (ref 150–400)
RBC: 2.43 MIL/uL — ABNORMAL LOW (ref 3.87–5.11)
RDW: 14.3 % (ref 11.5–15.5)
WBC: 8.9 10*3/uL (ref 4.0–10.5)
nRBC: 0.4 % — ABNORMAL HIGH (ref 0.0–0.2)

## 2019-11-22 LAB — MAGNESIUM: Magnesium: 2.1 mg/dL (ref 1.7–2.4)

## 2019-11-22 MED ORDER — SODIUM CHLORIDE 0.9 % IV SOLN
2.0000 g | Freq: Three times a day (TID) | INTRAVENOUS | Status: DC
  Administered 2019-11-22 – 2019-11-23 (×4): 2 g via INTRAVENOUS
  Filled 2019-11-22 (×5): qty 2

## 2019-11-22 MED ORDER — VANCOMYCIN HCL 1500 MG/300ML IV SOLN
1500.0000 mg | Freq: Once | INTRAVENOUS | Status: AC
  Administered 2019-11-22: 10:00:00 1500 mg via INTRAVENOUS
  Filled 2019-11-22: qty 300

## 2019-11-22 MED ORDER — VANCOMYCIN HCL 1250 MG/250ML IV SOLN
1250.0000 mg | INTRAVENOUS | Status: DC
  Administered 2019-11-23: 10:00:00 1250 mg via INTRAVENOUS
  Filled 2019-11-22: qty 250

## 2019-11-22 MED ORDER — POTASSIUM CHLORIDE 20 MEQ/15ML (10%) PO SOLN
40.0000 meq | ORAL | Status: AC
  Administered 2019-11-22 (×3): 40 meq
  Filled 2019-11-22 (×3): qty 30

## 2019-11-22 NOTE — Progress Notes (Signed)
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  On imaging she already had a large right MCA territory infarct.  She underwent a decompressive craniectomy on the evening of 2/18.   History of present illness   Unfortunately we have very little history on this 83 year old.  A friend tried to call her on the morning of admission, as she usually does at 6 AM, and when she was unable to awaken the patient went to visit and found her unresponsive.  She was brought to our department of emergency medicine where a CT and CTA were performed.  She has a large right MCA infarct and an M1 occlusion.  She was not a candidate for either thrombolytics or thrombectomy and she was taken to the operating room for craniectomy on the evening of 2/18.    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 Past surgical history is remarkable for a laparoscopic cholecystectomy.  Significant Hospital Events   Admission 12-14-2019.  Decompressive craniectomy 2/18  Consults:  Neurosurgery Neurology  Procedures:  Decompressive craniectomy 2/18  Significant Diagnostic Tests:  CT and CTA head 2/19 >> showing a large right MCA infarct and M1 occlusion  CT head 2/21 >> 1. Stable no new intracranial abnormality.  EEG 2/22 >> left anterior temporal sharp waves with epileptogenicity and no overt seizures  CT head 2/23 >> continued interval evolution of large right MCA territory infarct without hemorrhage, slight increase in parenchymal edema through the craniectomy defect, similar 4 to 5 mm right to left midline shift  EEG 2/25 >>  Left anterior temporal epileptogenicity  without overt seizures   Micro Data:  COVID 2/18 > Negative MRSA PCR 2/18 > Negative Blood 2/22 > Urine 2/22 > Respiratory 2/24 >> few GNR, moderate staph aureus >>   Antimicrobials:  Ceftriaxone 2/25 >> 2/26 Vancomycin 2/26 >> Cefepime 2/26 >>   Interim history/subjective:  T-max was 101.0 yesterday morning Remains on sedating medications Tolerated PSV 5 for most of the day 2/25 but no change in mental status Ceftriaxone started yesterday I/O +8.7 L total  Objective   Blood pressure 114/63, pulse 62, temperature 98.1 F (36.7 C), temperature source Oral, resp. rate (!) 27, height 5\' 1"  (1.549 m), weight 68.6 kg, SpO2 96 %.    Vent Mode: PSV;CPAP FiO2 (%):  [30 %-40 %] 40 % Set Rate:  [16 bmp] 16 bmp Vt Set:  [380 mL] 380 mL PEEP:  [5 cmH20] 5 cmH20 Pressure Support:  [5 cmH20-15 cmH20] 5 cmH20 Plateau Pressure:  [11 cmH20-158 cmH20] 11 cmH20   Intake/Output Summary (Last 24 hours) at 11/22/2019 0752 Last data filed at 11/22/2019 0700 Gross per 24 hour  Intake 2443.73 ml  Output 575 ml  Net 1868.73 ml   Filed Weights   12-14-19 2034 11/19/19 0500 11/20/19 0500  Weight: 67 kg 66.3 kg 68.6 kg    Examination: General: Critically ill, ventilated HEENT: Craniectomy clean and dry, ET tube in place, slight tongue protrusion with some oral secretions Neuro: Flickers eyes to stimulation, withdraws to pain, not following commands on sedation CV: Regular, no murmur PULM: Coarse bilaterally, no wheeze GI: Nondistended, hypoactive bowel sounds Extremities:  Trace lower extremity edema Skin: No rash  Assessment & Plan:  This is a 64 year old who is suffered from a large right MCA territory infarct and underwent decompressive craniotomy on 11/13/2019.  Acute Hypoxemic Respiratory Failure -Due to large right MCA infarct resulting in coma Possible evolving left lower lobe pneumonia.  Chest x-ray 2/25 reviewed, question retrocardiac infiltrate P: Barriers to extubation  continue to include neurological status, encephalopathy, agitation with associated sedation requirement.  Also difficult to assess her airway protection and bulbar function post stroke.  Now also with possible evolving healthcare associated pneumonia, cultures as above.  Suspect that she will require tracheostomy.  If no progress today or through the weekend then will refer for trach placement. Respiratory cultures not yet speciated, change ceftriaxone to cefepime on 2/26, add vancomycin on 2/26.  Narrow based on culture results. Follow chest x-ray VAP prevention bundle is in place  Large R MCA infarct w/ petechial hemorrhage R parietal  Cerebral edema  Epileptiform activity on EEG 2/22 without overt seizures. -s/p decompressive hemicrani 11/16/2019 -Question whether she may be having intermittent seizure activity P: Appreciate neurology and neurosurgery management Continue to maintain euthermia, euglycemia, eunatremia, normoxia Keppra now at 1000 mg twice daily Hypertonic saline discontinued, allowing sodium to drift down, 151 on 2/26  Circulatory Shock, resolved - suspect from sedation, possibly some vasoplegia.  P: Norepinephrine is off At risk for sepsis, treating possible left lower lobe pneumonia, antibiotics adjusted 2/26  Hypokalemia Replace 2/26  Anemia of critical disease, no clear source for acute blood loss.  Hemoglobin 7.4 on 2/26 Continue to follow CBC Continue conservative transfusion strategy, hemoglobin goal 7.0   Acute metabolic encephalopathy. Suspect multifactorial from delirium, sedating medications and acute stroke. Depression P: Agitated delirium has been an issue, barrier to ventilator weaning.  Continue to try to wean Precedex and fentanyl infusion as able.  Suspect she will need low-dose fentanyl based on her response to weaning last several days Low-dose clonazepam added on 2/24 Continue venlafaxine as ordered  At risk malnutrition due to inadequate oral  intake P: Continue tube feeding   Best practice:  Diet: tube feeds Pain/Anxiety/Delirium protocol (if indicated): precedex and fentanyl  VAP protocol (if indicated): In place DVT prophylaxis: SCDs only due bleeding risk  GI prophylaxis: Pepcid Glucose control: None required Mobility: Bed rest Code Status: Full Family Communication: Attempted to call niece 2/23, no answer.  Disposition: maintain ICU  Labs   CBC: Recent Labs  Lab 11/19/19 0450 11/19/19 0450 11/19/19 1640 11/19/19 1815 11/20/19 0238 11/21/19 0519 11/22/19 0422  WBC 6.6  --   --  9.9 10.0 8.7 8.9  NEUTROABS  --   --   --  7.0  --   --   --   HGB 6.9*   < > 8.3* 8.1* 7.9* 7.8* 7.4*  HCT 22.7*   < > 26.5* 25.6* 24.9* 24.7* 23.8*  MCV 99.6  --   --  96.2 96.1 98.0 97.9  PLT 168  --   --  196 194 188 200   < > = values in this interval not displayed.    Basic Metabolic Panel: Recent Labs  Lab 11/15/19 1840 11/16/19 0048 11/16/19 0272 11/16/19 0915 11/17/19 0349 11/17/19 0950 11/19/19 0450 11/19/19 0815 11/19/19 1640 11/19/19 1815 11/20/19 0238 11/21/19 0519 11/22/19 0422  NA 155*   < > 157*   < > 160*   < > 154*   < > 154* 152* 152* 152* 151*  K  --   --  3.5   < > 3.4*   < > 2.8*  --   --  3.3* 3.1* 3.3* 2.8*  CL  --   --  125*   < > 126*   < > 119*  --   --  121* 121* 121* 119*  CO2  --   --  25   < > 24   < > 27  --   --  26 24 23 22   GLUCOSE  --   --  152*   < > 141*   < > 162*  --   --  145* 127* 144* 176*  BUN  --   --  22   < > 18   < > 24*  --   --  22 20 22 21   CREATININE  --   --  0.77   < > 0.58   < > 0.57  --   --  0.62 0.61 0.80 0.55  CALCIUM  --   --  8.1*   < > 8.3*   < > 8.2*  --   --  7.9* 7.6* 8.0* 7.6*  MG 2.3  --  2.4  --   --   --   --   --   --   --   --  2.3 2.1  PHOS 2.9  --  1.7*  --  3.0  --   --   --   --   --   --   --   --    < > = values in this interval not displayed.   GFR: Estimated Creatinine Clearance: 63.7 mL/min (by C-G formula based on SCr of 0.55  mg/dL). Recent Labs  Lab 11/19/19 1815 11/20/19 0238 11/21/19 0519 11/22/19 0422  WBC 9.9 10.0 8.7 8.9    Liver Function Tests: No results for input(s): AST, ALT, ALKPHOS, BILITOT, PROT, ALBUMIN in the last 168 hours. No results for input(s): LIPASE, AMYLASE in the last 168 hours. No results for input(s): AMMONIA in the last 168 hours.  ABG    Component Value Date/Time   PHART 7.374 11/12/2019 2112   PCO2ART 41.0 11/19/2019 2112   PO2ART 114.0 (H) 11/20/2019 2112   HCO3 23.9 11/08/2019 2112   TCO2 25 11/16/2019 2112   ACIDBASEDEF 1.0 10/31/2019 2112   O2SAT 98.0 11/07/2019 2112     Coagulation Profile: No results for input(s): INR, PROTIME in the last 168 hours.  Cardiac Enzymes: No results for input(s): CKTOTAL, CKMB, CKMBINDEX, TROPONINI in the last 168 hours.  HbA1C: Hgb A1c MFr Bld  Date/Time Value Ref Range Status  11/15/2019 03:45 AM 5.0 4.8 - 5.6 % Final    Comment:    (NOTE) Pre diabetes:          5.7%-6.4% Diabetes:              >6.4% Glycemic control for   <7.0% adults with diabetes     CBG: Recent Labs  Lab 11/21/19 1153 11/21/19 1606 11/21/19 1907 11/21/19 2313 11/22/19 0404  GLUCAP 144* 128* 163* 126* 140*   CRITICAL CARE Performed by: 11/23/19  Total critical care time: 33 minutes  Critical care time was exclusive of separately billable procedures and treating other patients.  Critical care was necessary to treat or prevent imminent or life-threatening deterioration.  Critical care was time spent personally by me on the following activities: development of treatment plan with patient and/or surrogate as well as nursing, discussions with consultants, evaluation  of patient's response to treatment, examination of patient, obtaining history from patient or surrogate, ordering and performing treatments and interventions, ordering and review of laboratory studies, ordering and review of radiographic studies, pulse oximetry and  re-evaluation of patient's condition.   Levy Pupa, MD, PhD 11/22/2019, 7:52 AM [facility] [phone] or if no answer [phone]

## 2019-11-22 NOTE — Progress Notes (Signed)
Pharmacy Antibiotic Note  Paula Kim is a 64 y.o. female admitted on 11/27/19 with left-sided weakness.  Sputum culture is growing Staph and GNR; therefore, Pharmacy has been consulted for vancomycin and cefepime dosing for PNA.  Renal function stable, Tmax 101.6, WBC WNL.  Plan: Vanc 1500mg  IV x 1, then 1250mg  IV Q24H for AUC 512 using SCr 0.8 Cefepime 2gm IV Q8H Monitor renal fxn, micro data, vanc AUC at Css   Height: 5\' 1"  (154.9 cm) Weight: 151 lb 3.8 oz (68.6 kg) IBW/kg (Calculated) : 47.8  Temp (24hrs), Avg:99.4 F (37.4 C), Min:98.1 F (36.7 C), Max:100.5 F (38.1 C)  Recent Labs  Lab 11/19/19 0450 11/19/19 1815 11/20/19 0238 11/21/19 0519 11/22/19 0422  WBC 6.6 9.9 10.0 8.7 8.9  CREATININE 0.57 0.62 0.61 0.80 0.55    Estimated Creatinine Clearance: 63.7 mL/min (by C-G formula based on SCr of 0.55 mg/dL).    Allergies  Allergen Reactions  . Erythromycin Swelling and Rash   CTX 2/25 >> 2/26 Vanc 2/26 >> Cefepime 2/26 >>  2/18 covid / flu / MRSA - negative 2/14 sputum - Staph aureus + GNR (preliminary) 2/22 BCx - NGTD  Edeline Greening D. 3/18, PharmD, BCPS, BCCCP 11/22/2019, 8:32 AM

## 2019-11-22 NOTE — Progress Notes (Signed)
PT Cancellation Note  Patient Details Name: Carmellia Kreisler MRN: [id number] DOB: 20-Jul-1956   Cancelled Treatment:    Reason Eval/Treat Not Completed: Medical issues which prohibited therapy (per RN, pt not appropriate for therapy today. Remains intubated/sedated, with increased respiratory rate with minimal stimulation).    Lillia Pauls, PT, DPT Acute Rehabilitation Services Pager [phone] Office [phone]    Norval Morton 11/22/2019, 12:26 PM

## 2019-11-22 NOTE — Progress Notes (Signed)
STROKE TEAM PROGRESS NOTE   INTERVAL HISTORY RN at bedside.  Patient no neuro changes, still able to open eyes on voice, barely follow commands on the right hand and foot with lowered sedation, still not a candidate for extubation today, likely need to trach next week.  EEG yesterday no seizure but still has left anterior temporal sharp waves mixed with generalized slowing background.   Vitals:   11/22/19 0500 11/22/19 0600 11/22/19 0700 11/22/19 0735  BP: (!) 110/56 (!) 106/57 114/63   Pulse: 66 66 65 62  Resp: (!) 31 (!) 31 (!) 30 (!) 27  Temp:      TempSrc:      SpO2: 96% 96% 96% 96%  Weight:      Height:        CBC:  Recent Labs  Lab 11/19/19 1815 11/20/19 0238 11/21/19 0519 11/22/19 0422  WBC 9.9   < > 8.7 8.9  NEUTROABS 7.0  --   --   --   HGB 8.1*   < > 7.8* 7.4*  HCT 25.6*   < > 24.7* 23.8*  MCV 96.2   < > 98.0 97.9  PLT 196   < > 188 200   < > = values in this interval not displayed.    Basic Metabolic Panel:  Recent Labs  Lab 11/16/19 0608 11/16/19 0915 11/17/19 0349 11/17/19 0950 11/21/19 0519 11/22/19 0422  NA 157*   < > 160*   < > 152* 151*  K 3.5   < > 3.4*   < > 3.3* 2.8*  CL 125*   < > 126*   < > 121* 119*  CO2 25   < > 24   < > 23 22  GLUCOSE 152*   < > 141*   < > 144* 176*  BUN 22   < > 18   < > 22 21  CREATININE 0.77   < > 0.58   < > 0.80 0.55  CALCIUM 8.1*   < > 8.3*   < > 8.0* 7.6*  MG 2.4   < >  --   --  2.3 2.1  PHOS 1.7*  --  3.0  --   --   --    < > = values in this interval not displayed.   Lipid Panel:     Component Value Date/Time   CHOL 125 11/15/2019 0345   TRIG 151 (H) 11/15/2019 0345   HDL 25 (L) 11/15/2019 0345   CHOLHDL 5.0 11/15/2019 0345   VLDL 30 11/15/2019 0345   LDLCALC 70 11/15/2019 0345   HgbA1c:  Lab Results  Component Value Date   HGBA1C 5.0 11/15/2019   Urine Drug Screen:     Component Value Date/Time   LABOPIA NONE DETECTED 11/07/2019 1315   COCAINSCRNUR NONE DETECTED 11/01/2019 1315   LABBENZ NONE  DETECTED 11/17/2019 1315   AMPHETMU NONE DETECTED 11/06/2019 1315   THCU POSITIVE (A) 10/28/2019 1315   LABBARB NONE DETECTED 11/06/2019 1315    Alcohol Level     Component Value Date/Time   ETH <10 11/18/2019 1030    IMAGING  CT head:  11/22/2019   1. Extensive abnormal hypodensity throughout much of the right MCA vascular territory consistent with acute/early subacute ischemic infarction. No evidence of hemorrhagic conversion. No significant mass effect at this time.  2. Background chronic ischemic changes as detailed  3. Mild generalized parenchymal atrophy.  4. Air-fluid level and frothy secretions within the left maxillary sinus. Correlate  for acute sinusitis.   CT C-SPINE NO CHARGE 2019/11/15 IMPRESSION:  1. No acute fracture or dislocation of the cervical spine.  2. Multilevel degenerative disc disease and facet arthropathy.  3. Thoracic aortic aneurysm. Please refer to forthcoming CT angiogram report for full evaluation of the vascular findings.  4. 1.8 cm right thyroid lobe nodule. Dedicated thyroid ultrasound is recommended on a nonemergent basis.  5. Centrilobular emphysema. Emphysema (ICD10-J43.9).   CTA head:  11-15-2019   1. 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 No significant reconstitution of the right M1 MCA more distally. There is minimal reconstitution of right M2 and more distal right MCA branches.  2. Posterior circulation atherosclerotic stenoses. Most notably, there is focal near occlusive stenosis versus focal occlusion of the distal left vertebral artery just proximal to the vertebrobasilar junction. Moderate to moderately severe stenoses within the mid P2 posterior cerebral arteries bilaterally.  3. Mild atherosclerotic plaque within the intracranial internal carotid arteries.   CTA neck:  11-15-19   1. The bilateral common carotid, internal carotid and vertebral arteries are patent within the neck without significant stenosis (50% or  greater).  2. Mixed plaque within the carotid bifurcations and proximal internal carotid arteries bilaterally. There is possible ulcerated plaque within the right carotid bulb. There is lobular dilation of the bilateral carotid bulbs. Additionally, there is significant tortuosity of the proximal left ICA.  3. 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 Please correlate with findings on concurrently performed CTA chest/abdomen/pelvis.  4. 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  5. 1.8 cm nodule within the right thyroid lobe. Nonemergent dedicated thyroid ultrasound is recommended for further evaluation, as clinically appropriate.  ADDENDUM: The presence of a thoracic aortic aneurysm, lobular dilation of both carotid bulbs and 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   CT perfusion head 2019-11-15    The perfusion software identifies a 122 mL core infarct within the right MCA vascular territory. The perfusion software identifies a 139 mL region of critically hypoperfused parenchyma within the right MCA vascular territory. Reported mismatch volume 17 mL.   MR BRAIN WO CONTRAST 11-15-19 IMPRESSION:  1. Motion degraded exam.  2. Acute ischemic infarction involving much of the right MCA vascular territory. Only mild mass effect at this time. Mild petechial hemorrhage within portions of the right parietal infarction territory.  3. Redemonstrated small chronic cortically based infarcts within the left parietooccipital lobes.  4. Moderate/advanced chronic small vessel ischemic disease with multiple chronic lacunar infarcts, progressed from MRI 07/08/2009.  5. Mild generalized parenchymal atrophy.  6. Air-fluid level and frothy secretions within the left maxillary sinus. Correlate for acute sinusitis.  CT HEAD WO CONTRAST 11/15/2019 IMPRESSION:  1. Large  right MCA infarct with no hemorrhagic transformation.  2. Status post right hemi-craniectomy with mild intracranial mass effect. 5 mm of leftward midline shift. Patent basilar cisterns.  3. Underlying chronic small vessel disease.  CT HEAD WO CONTRAST 11/17/2019 1. Stable since 11/15/2019. Large right MCA infarct with no malignant hemorrhagic transformation. Stable 5 mm of leftward midline shift. 2. No new intracranial abnormality.   CT HEAD WO CONTRAST 11/19/2019 1. Continued interval evolution of large right MCA territory infarct without evidence for hemorrhagic transformation. Slight interval increase in swelling of brain parenchyma through the craniectomy defect, but with similar 4-5 mm right-to-left midline shift. 2. No other new acute intracranial abnormality.   CT  Angio Chest/Abd/Pel for Dissection W and/or W/WO 11/09/2019 1. 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 No dissection or intramural hematoma.  2. 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 Recommend followup by abdomen and pelvis CTA in 6 months, and vascular surgery referral/consultation if not already obtained. This recommendation follows ACR consensus guidelines: White Paper of the ACR Incidental Findings Committee II on Vascular Findings. J Am Coll Radiol 2013; 10:789-794. Aortic aneurysm NOS (ICD10-I71.9)  3. 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  4. Cirrhotic changes involving the liver. No worrisome hepatic lesions.  5. Emphysematous changes and pulmonary scarring but no acute pulmonary findings.  6. 17 mm right thyroid nodule. Recommend thyroid US and potential biopsy (ref: J Am Coll Radiol. 2015 Feb;12(2): 143-50).  7. Aortic Atherosclerosis (ICD10-I70.0) and Emphysema (ICD10-J43.9).   DG CHEST PORT 1 VIEW 11/20/2019 pending    11/22/2019 Stable hazy opacities in both lungs likely reflecting a combination of atelectasis with layering left effusion. Lines and tubes. 11/21/2019 Persistent basilar opacities, favor atelectasis though more confluent opacity in the retrocardiac space could reflect airspace disease. 11/18/2019 1. Unchanged retrocardiac opacity with volume loss, may be atelectasis, pneumonia or aspiration. Perihilar atelectasis on the right. 2. Stable support apparatus allowing for patient rotation.  11/17/2019 Retrocardiac opacity with volume loss suggesting atelectasis. Given the history, superimposed infection is not excluded. Unremarkable hardware 11/02/2019 No active disease.  Aortic atherosclerosis, tortuosity and ectasia.  11/19/2019 1.  Lungs clear.  2. Prominence of the descending aorta with questionable degree of aneurysmal dilatation distally.   ECHOCARDIOGRAM COMPLETE 10/31/2019 1. Left ventricular ejection fraction, by estimation, is 60 to 65%. The left ventricle has normal function. The left ventricle has no regional wall motion abnormalities. Indeterminate diastolic filling due to E-A fusion.   2. Right ventricular systolic function is normal. The right ventricular size is normal. Tricuspid regurgitation signal is inadequate for assessing PA pressure.   3. The mitral valve is normal in structure and function. No evidence of mitral valve regurgitation. No evidence of mitral stenosis.   4. The aortic valve is tricuspid. Aortic valve regurgitation is not visualized. Mild aortic valve sclerosis is present, with no evidence of aortic valve stenosis.   5. The inferior vena cava is normal in size with greater than 50% respiratory variability, suggesting right atrial pressure of 3 mmHg.   6. Technically difficult study with poor acoustic windows.   EEG adult 11/18/2019 This technically difficult study showed evidence of epileptogenicity in left anterior temporal region as well as moderate to severe  diffuse encephalopathy, non specific to etiology. No seizures  were seen throughout the recording. If concern for ictal and interictal activity persists, please consider repeat prolonged study.   ECG - ST rate 109 BPM. (See cardiology reading for complete details)  PHYSICAL EXAM   Temp:  [98.1 F (36.7 C)-100.5 F (38.1 C)] 98.1 F (36.7 C) (02/26 0400) Pulse Rate:  [51-89] 62 (02/26 0735) Resp:  [21-36] 27 (02/26 0735) BP: (105-169)/(56-83) 114/63 (02/26 0700) SpO2:  [93 %-98 %] 96 % (02/26 0735) FiO2 (%):  [30 %-40 %] 40 % (02/26 0735)  General - Well nourished, well developed, intubated on precedex and fentanyl.  Ophthalmologic - fundi not visualized due to noncooperation.  Cardiovascular - Regular rate and rhythm without stimulation  Neuro - intubated on precedex and fentanyl, eyes open on voice, barely following  limited simple commands on the right hand (showing thumb) and right foot (weakly wiggled toes). With eye opening, eyes right gaze preference, not cross midline, no nystagmus. Not blinking to visual threat bilaterally, PERRL. Corneal reflex present, gag and cough present. Breathing over the vent.  Facial symmetry not able to test due to ET tube.  Tongue protrusion not cooperative. RUE and RLE spontaneous movement, but not against gravity. On pain stimulation, LUE 0/5 and LLE slight withdraw. DTR 1+ and positive babinski bilaterally. Sensation, coordination and gait not tested.   ASSESSMENT/PLAN Ms. Paula Kim is a 64 y.o. female with history of HLD found down presenting with L sided weakness and dysarthria.  Stroke:   Large R MCA infarct w/ petechial hemorrhage R parietal s/p decompressive hemicrani - source unclear, ? large vessel vasculopathy  CT head extensive hypodensity R MCA territory w/ infarct.  CTA head proximal R M1 occlusion. Posterior circulation atherosclerosis - L VA stenosis vs focal occlusion. B mid P2 PCA moderate to severe stenoses.  CTA neck mixed  plaque bifurcations and proximal B ICAs. Possible ulcerated plaque R ICA bulb. Lobar dilation B ICA bulbs. L ICA tortuous. 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   CT perfusion positive for penumbra  MRI  R MCA infarct w/ R parietal petechial hemorrhage. Chronic L parietooccipital infarcts.   CT repeat 2/21 - stable 40mm MLS  CT repeat 2/23 - slight interval increase in brain swelling through crani, interval evolution, stable 4 to 5 mm MLS  LE venous doppler - negative for DVT bilaterally  2D Echo EF 60-65%. No source of embolus   Consider 30 day cardiac event monitoring as outpt  LDL 70  HgbA1c 5.0  Heparin 5000 units sq tid for VTE prophylaxis  aspirin 325 mg daily prior to admission, now on aspirin 325 mg daily. No DAPT given potential trach/PEG next week  Therapy recommendations:  Pending. Reorder when able to evaluate  Disposition:  pending   Acute Respiratory Failure  Intubated  On weaning   Sedated with precedex and fentanyl  Klonopin added 0.25 bid  CCM onboard  Not candidate for extubation at this time  likely require trach next week  Cerebral Edema  S/p Right decompression hemicrani by Cabbell on 10/29/2019  CT 2/21 stable 42mm MLS  CT repeat 2/23 - slight interval increase in brain swelling through crani, interval evolution, stable 4-5 mm MLS  On Keppra for seizure prevention  On 3% saline -> off -> NS @40  -> 1/2NS @40  ->NS @40   Na 140->...>152->151  Seizure like activity  Stimulation induced increased muscle tone at LUE and tonic posturing of RUE and RLE.   EEG left frontal sharp waves  On keppra 500 -> increase to 1000 bid  Repeat EEG 2/25 L anterior temporal lobe sharp waves  Continue keppra  Fever  Possible evolving LLL PNA  Tmax 101.2->100.6->101->100.5  On ice pack  Mild Leukocytosis, - 11.1->14.8->7.6->6.6->9.9->10.0->8.7  Blood culture 2/22 no growth 2 days   UA neg    CXR stable hazy opacities, ? Retrocardiac infiltrate  Surgical wound clear dry, no pus  Resp cx GPC in clusters, GPR  On rocephin 2/26>>2/26   On cefepime  2/26>>  On vanc 2/26>>  Acute blood loss anemia, critical illness   Hb - 13->7.6->8.2->7.1->6.9->1u PRBC-> 8.3->8.1->7.9->7.8<7.4  Stool guaiac pending    Close monitoring CBC  Iron panel Fer ok, Fe 27, TIBC 172   On iron supplement  CCM on board  Hypotension  Off Levophed  Stable on the lower end . SBP goal 110-180 . Long-term BP goal normotensive  Hyperlipidemia  Home meds:  pravachol 40, fish oil, fenofibrate  LDL 70, goal < 70  On Crestor 20  Continue statin on discharge  Dysphagia At risk malnutrition . Secondary to stroke . NPO . on TF @ 74 . IVF NS @ 40 . 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 F/u in 6 mos. 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   Repeat in 6 months  Other Stroke Risk Factors  Former Cigarette smoker, quit 13 yrs ago  Substance abuse - UDS:  THC POSITIVE, Will advise to stop using due to stroke risk.  Hx stroke/TIA  06/2009 - midbrain and L PCA territory infarcts. Small vessel disease.    Other Active Problems  Hx skew deviation since childhood, worsened post stroke  Hypokalemia 2.7-3.6->3.4->3.8->2.8->3.3->3.1->3.3->2.8 supplement  R thyroid nodule. Nonemergent [place] recommended  Depression on Effexor  Hospital day # 8  This patient is critically ill and at significant risk of neurological worsening, death and care requires constant monitoring of vital signs, hemodynamics,respiratory and cardiac monitoring, extensive review of multiple databases, frequent neurological assessment, discussion with family, other specialists and medical decision making of high  complexity. I spent 30 minutes of neurocritical care time  in the care of  this patient.   Rosalin Hawking, MD PhD Stroke Neurology 11/22/2019 9:14 AM  To contact Stroke Continuity provider, please refer to [email or web address] After hours, contact General Neurology

## 2019-11-23 ENCOUNTER — Inpatient Hospital Stay (HOSPITAL_COMMUNITY): Payer: Self-pay

## 2019-11-23 LAB — CULTURE, BLOOD (ROUTINE X 2)
Culture: NO GROWTH
Culture: NO GROWTH
Special Requests: ADEQUATE
Special Requests: ADEQUATE

## 2019-11-23 LAB — CULTURE, RESPIRATORY W GRAM STAIN

## 2019-11-23 LAB — CBC
HCT: 23.2 % — ABNORMAL LOW (ref 36.0–46.0)
Hemoglobin: 7 g/dL — ABNORMAL LOW (ref 12.0–15.0)
MCH: 30.2 pg (ref 26.0–34.0)
MCHC: 30.2 g/dL (ref 30.0–36.0)
MCV: 100 fL (ref 80.0–100.0)
Platelets: 221 10*3/uL (ref 150–400)
RBC: 2.32 MIL/uL — ABNORMAL LOW (ref 3.87–5.11)
RDW: 14.3 % (ref 11.5–15.5)
WBC: 8.5 10*3/uL (ref 4.0–10.5)
nRBC: 0.6 % — ABNORMAL HIGH (ref 0.0–0.2)

## 2019-11-23 LAB — GLUCOSE, CAPILLARY
Glucose-Capillary: 142 mg/dL — ABNORMAL HIGH (ref 70–99)
Glucose-Capillary: 142 mg/dL — ABNORMAL HIGH (ref 70–99)
Glucose-Capillary: 153 mg/dL — ABNORMAL HIGH (ref 70–99)
Glucose-Capillary: 146 mg/dL — ABNORMAL HIGH (ref 70–99)
Glucose-Capillary: 165 mg/dL — ABNORMAL HIGH (ref 70–99)
Glucose-Capillary: 143 mg/dL — ABNORMAL HIGH (ref 70–99)

## 2019-11-23 LAB — BASIC METABOLIC PANEL
Anion gap: 7 (ref 5–15)
BUN: 19 mg/dL (ref 8–23)
CO2: 22 mmol/L (ref 22–32)
Calcium: 7.5 mg/dL — ABNORMAL LOW (ref 8.9–10.3)
Chloride: 119 mmol/L — ABNORMAL HIGH (ref 98–111)
Creatinine, Ser: 0.32 mg/dL — ABNORMAL LOW (ref 0.44–1.00)
GFR calc Af Amer: >60 mL/min (ref >60)
GFR calc non Af Amer: >60 mL/min (ref >60)
Glucose, Bld: 141 mg/dL — ABNORMAL HIGH (ref 70–99)
Potassium: 3.1 mmol/L — ABNORMAL LOW (ref 3.5–5.1)
Sodium: 148 mmol/L — ABNORMAL HIGH (ref 135–145)

## 2019-11-23 MED ORDER — CEFAZOLIN SODIUM-DEXTROSE 2-4 GM/100ML-% IV SOLN
2.0000 g | Freq: Three times a day (TID) | INTRAVENOUS | Status: DC
  Administered 2019-11-23 – 2019-11-25 (×6): 2 g via INTRAVENOUS
  Filled 2019-11-23 (×7): qty 100

## 2019-11-23 MED ORDER — ATROPINE SULFATE 1 MG/10ML IJ SOSY
PREFILLED_SYRINGE | INTRAMUSCULAR | Status: AC
  Filled 2019-11-23: qty 10

## 2019-11-23 MED ORDER — POTASSIUM CHLORIDE 20 MEQ/15ML (10%) PO SOLN
40.0000 meq | ORAL | Status: AC
  Administered 2019-11-23 (×4): 40 meq
  Filled 2019-11-23 (×4): qty 30

## 2019-11-23 MED ORDER — FREE WATER
200.0000 mL | Freq: Four times a day (QID) | Status: DC
  Administered 2019-11-23 – 2019-11-25 (×7): 200 mL

## 2019-11-23 MED ORDER — INSULIN ASPART 100 UNIT/ML ~~LOC~~ SOLN
0.0000 [IU] | SUBCUTANEOUS | Status: DC
  Administered 2019-11-24 – 2019-11-25 (×5): 2 [IU] via SUBCUTANEOUS
  Administered 2019-11-25: 4 [IU] via SUBCUTANEOUS
  Administered 2019-11-25: 2 [IU] via SUBCUTANEOUS

## 2019-11-23 MED ORDER — BISACODYL 10 MG RE SUPP
10.0000 mg | Freq: Once | RECTAL | Status: AC
  Administered 2019-11-24 (×2): 10 mg via RECTAL
  Filled 2019-11-23: qty 1

## 2019-11-23 NOTE — Progress Notes (Signed)
Neurosurgery Service Progress Note  Subjective: No acute events overnight   Objective: Vitals:   11/23/19 0700 11/23/19 0800 11/23/19 0832 11/23/19 0900  BP: (!) 104/57 117/63 (!) 99/58 (!) 107/59  Pulse: 64 66 66 62  Resp: (!) 28 (!) 29 (!) 24 (!) 21  Temp:  100.1 F (37.8 C)    TempSrc:  Axillary    SpO2: 96% 97% 96% 96%  Weight:      Height:       Temp (24hrs), Avg:99.6 F (37.6 C), Min:99.2 F (37.3 C), Max:100.1 F (37.8 C)  CBC Latest Ref Rng & Units 11/23/2019 11/22/2019 11/21/2019  WBC 4.0 - 10.5 K/uL 8.5 8.9 8.7  Hemoglobin 12.0 - 15.0 g/dL 7.0(L) 7.4(L) 7.8(L)  Hematocrit 36.0 - 46.0 % 23.2(L) 23.8(L) 24.7(L)  Platelets 150 - 400 K/uL 221 200 188   BMP Latest Ref Rng & Units 11/23/2019 11/22/2019 11/21/2019  Glucose 70 - 99 mg/dL 141(H) 176(H) 144(H)  BUN 8 - 23 mg/dL 19 21 22   Creatinine 0.44 - 1.00 mg/dL 0.32(L) 0.55 0.80  Sodium 135 - 145 mmol/L 148(H) 151(H) 152(H)  Potassium 3.5 - 5.1 mmol/L 3.1(L) 2.8(L) 3.3(L)  Chloride 98 - 111 mmol/L 119(H) 119(H) 121(H)  CO2 22 - 32 mmol/L 22 22 23   Calcium 8.9 - 10.3 mg/dL 7.5(L) 7.6(L) 8.0(L)    Intake/Output Summary (Last 24 hours) at 11/23/2019 0935 Last data filed at 11/23/2019 0700 Gross per 24 hour  Intake 3729.13 ml  Output 2000 ml  Net 1729.13 ml    Current Facility-Administered Medications:  .  acetaminophen (TYLENOL) tablet 650 mg, 650 mg, Oral, Q4H PRN **OR** acetaminophen (TYLENOL) 160 MG/5ML solution 650 mg, 650 mg, Per Tube, Q4H PRN, 650 mg at 11/23/19 0457 **OR** acetaminophen (TYLENOL) suppository 650 mg, 650 mg, Rectal, Q4H PRN, Greta Doom, MD .  aspirin EC tablet 325 mg, 325 mg, Oral, Daily, 325 mg at 11/21/19 0909 **OR** aspirin suppository 300 mg, 300 mg, Rectal, Daily, Rosalin Hawking, MD, 300 mg at 11/22/19 0943 .  atropine 1 MG/10ML injection, , , ,  .  ceFEPIme (MAXIPIME) 2 g in sodium chloride 0.9 % 100 mL IVPB, 2 g, Intravenous, Q8H, Dang, Thuy D, RPH, Last Rate: 200 mL/hr at 11/23/19  0853, 2 g at 11/23/19 0853 .  chlorhexidine gluconate (MEDLINE KIT) (PERIDEX) 0.12 % solution 15 mL, 15 mL, Mouth Rinse, BID, Greta Doom, MD, 15 mL at 11/23/19 0756 .  Chlorhexidine Gluconate Cloth 2 % PADS 6 each, 6 each, Topical, Daily, Collene Gobble, MD, 6 each at 11/22/19 0100 .  clonazepam (KLONOPIN) disintegrating tablet 0.25 mg, 0.25 mg, Per Tube, BID, Collene Gobble, MD, 0.25 mg at 11/22/19 2136 .  dexmedetomidine (PRECEDEX) 400 MCG/100ML (4 mcg/mL) infusion, 0.4-1.8 mcg/kg/hr, Intravenous, Titrated, Byrum, Rose Fillers, MD, Last Rate: 15.08 mL/hr at 11/23/19 0700, 0.9 mcg/kg/hr at 11/23/19 0700 .  famotidine (PEPCID) 40 MG/5ML suspension 20 mg, 20 mg, Per Tube, BID, Garvin Fila, MD, 20 mg at 11/22/19 2136 .  feeding supplement (PRO-STAT SUGAR FREE 64) liquid 30 mL, 30 mL, Per Tube, Daily, Rosalin Hawking, MD, 30 mL at 11/22/19 1103 .  feeding supplement (VITAL AF 1.2 CAL) liquid 1,000 mL, 1,000 mL, Per Tube, Continuous, Rosalin Hawking, MD, Last Rate: 55 mL/hr at 11/23/19 0621, 1,000 mL at 11/23/19 0621 .  fentaNYL (SUBLIMAZE) injection 50 mcg, 50 mcg, Intravenous, Q2H PRN, Sampson Goon, MD, 50 mcg at 11/19/19 1408 .  fentaNYL 2541mg in NS 2522m(1012mml) infusion-PREMIX, 50-200  mcg/hr, Intravenous, Continuous, [place], Emily T, MD, Last Rate: 5 mL/hr at 11/23/19 0700, 50 mcg/hr at 11/23/19 0700 .  ferrous sulfate 300 (60 Fe) MG/5ML syrup 300 mg, 300 mg, Per Tube, BID WC, Skeet Simmer, RPH, 300 mg at 11/23/19 0756 .  free water 200 mL, 200 mL, Per Tube, Q6H, Merlene Laughter F, NP, 200 mL at 11/23/19 0800 .  heparin injection 5,000 Units, 5,000 Units, Subcutaneous, Q8H, Ashok Pall, MD, 5,000 Units at 11/23/19 0620 .  labetalol (NORMODYNE) injection 10-20 mg, 10-20 mg, Intravenous, Q10 min PRN, Rosalin Hawking, MD .  levETIRAcetam (KEPPRA) 100 MG/ML solution 1,000 mg, 1,000 mg, Per Tube, BID, Rosalin Hawking, MD, 1,000 mg at 11/22/19 2136 .  MEDLINE mouth rinse, 15 mL, Mouth Rinse, 10  times per day, Greta Doom, MD, 15 mL at 11/23/19 8099 .  midazolam (VERSED) injection 1 mg, 1 mg, Intravenous, Q2H PRN, Genevive Bi, Shona Needles, MD, 1 mg at 11/22/19 1955 .  naloxone [facility]) injection 0.08 mg, 0.08 mg, Intravenous, PRN, Ashok Pall, MD .  ondansetron (ZOFRAN) tablet 4 mg, 4 mg, Oral, Q4H PRN **OR** ondansetron (ZOFRAN) injection 4 mg, 4 mg, Intravenous, Q4H PRN, Ashok Pall, MD .  rosuvastatin (CRESTOR) tablet 20 mg, 20 mg, Per Tube, q1800, Garvin Fila, MD, 20 mg at 11/22/19 1736 .  senna-docusate (Senokot-S) tablet 1 tablet, 1 tablet, Per Tube, Jackelyn Poling, MD, 1 tablet at 11/22/19 2136 .  sodium chloride flush (NS) 0.9 % injection 10-40 mL, 10-40 mL, Intracatheter, Q12H, Garvin Fila, MD, 10 mL at 11/22/19 2137 .  sodium chloride flush (NS) 0.9 % injection 10-40 mL, 10-40 mL, Intracatheter, PRN, Garvin Fila, MD .  vancomycin (VANCOREADY) IVPB 1250 mg/250 mL, 1,250 mg, Intravenous, Q24H, Dang, Thuy D, RPH .  venlafaxine (EFFEXOR) tablet 75 mg, 75 mg, Per Tube, BID, Garvin Fila, MD, 75 mg at 11/23/19 8338   Physical Exam: R trauma flap incision c/d/i, flap soft Eyes open to voice, moves R side purposefully  Assessment & Plan: 64 y.o. woman w/ R MCA infarct s/p hemicraniectomy.  -no change in neurosurgical plan of care  Paula Kim  11/23/19 9:35 AM

## 2019-11-23 NOTE — Progress Notes (Signed)
STROKE TEAM PROGRESS NOTE   INTERVAL HISTORY RN at bedside.  Patient no neuro changes, still able to open eyes on voice, barely follow commands on the right hand and foot but moving right side purposefully, still not a candidate for extubation today, likely need to trach next week.  EEG no seizure but still has left anterior temporal sharp waves mixed with generalized slowing background.   Vitals:   11/23/19 0700 11/23/19 0800 11/23/19 0832 11/23/19 0900  BP: (!) 104/57 117/63 (!) 99/58 (!) 107/59  Pulse: 64 66 66 62  Resp: (!) 28 (!) 29 (!) 24 (!) 21  Temp:  100.1 F (37.8 C)    TempSrc:  Axillary    SpO2: 96% 97% 96% 96%  Weight:      Height:        CBC:  Recent Labs  Lab 11/19/19 1815 11/20/19 0238 11/22/19 0422 11/23/19 0621  WBC 9.9   < > 8.9 8.5  NEUTROABS 7.0  --   --   --   HGB 8.1*   < > 7.4* 7.0*  HCT 25.6*   < > 23.8* 23.2*  MCV 96.2   < > 97.9 100.0  PLT 196   < > 200 221   < > = values in this interval not displayed.    Basic Metabolic Panel:  Recent Labs  Lab 11/17/19 0349 11/17/19 0950 11/21/19 0519 11/21/19 0519 11/22/19 0422 11/23/19 0621  NA 160*   < > 152*   < > 151* 148*  K 3.4*   < > 3.3*   < > 2.8* 3.1*  CL 126*   < > 121*   < > 119* 119*  CO2 24   < > 23   < > 22 22  GLUCOSE 141*   < > 144*   < > 176* 141*  BUN 18   < > 22   < > 21 19  CREATININE 0.58   < > 0.80   < > 0.55 0.32*  CALCIUM 8.3*   < > 8.0*   < > 7.6* 7.5*  MG  --   --  2.3  --  2.1  --   PHOS 3.0  --   --   --   --   --    < > = values in this interval not displayed.   Lipid Panel:     Component Value Date/Time   CHOL 125 11/15/2019 0345   TRIG 151 (H) 11/15/2019 0345   HDL 25 (L) 11/15/2019 0345   CHOLHDL 5.0 11/15/2019 0345   VLDL 30 11/15/2019 0345   LDLCALC 70 11/15/2019 0345   HgbA1c:  Lab Results  Component Value Date   HGBA1C 5.0 11/15/2019   Urine Drug Screen:     Component Value Date/Time   LABOPIA NONE DETECTED 12-01-2019 1315   COCAINSCRNUR NONE  DETECTED 12/01/2019 1315   LABBENZ NONE DETECTED 12/01/19 1315   AMPHETMU NONE DETECTED 2019/12/01 1315   THCU POSITIVE (A) 12-01-2019 1315   LABBARB NONE DETECTED 12/01/19 1315    Alcohol Level     Component Value Date/Time   ETH <10 December 01, 2019 1030    IMAGING  CT head:  2019/12/01   1. Extensive abnormal hypodensity throughout much of the right MCA vascular territory consistent with acute/early subacute ischemic infarction. No evidence of hemorrhagic conversion. No significant mass effect at this time.  2. Background chronic ischemic changes as detailed  3. Mild generalized parenchymal atrophy.  4. Air-fluid level and  frothy secretions within the left maxillary sinus. Correlate for acute sinusitis.   CT C-SPINE NO CHARGE 11/22/19 IMPRESSION:  1. No acute fracture or dislocation of the cervical spine.  2. Multilevel degenerative disc disease and facet arthropathy.  3. Thoracic aortic aneurysm. Please refer to forthcoming CT angiogram report for full evaluation of the vascular findings.  4. 1.8 cm right thyroid lobe nodule. Dedicated thyroid ultrasound is recommended on a nonemergent basis.  5. Centrilobular emphysema. Emphysema (ICD10-J43.9).   CTA head:  11-22-2019   1. 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 No significant reconstitution of the right M1 MCA more distally. There is minimal reconstitution of right M2 and more distal right MCA branches.  2. Posterior circulation atherosclerotic stenoses. Most notably, there is focal near occlusive stenosis versus focal occlusion of the distal left vertebral artery just proximal to the vertebrobasilar junction. Moderate to moderately severe stenoses within the mid P2 posterior cerebral arteries bilaterally.  3. Mild atherosclerotic plaque within the intracranial internal carotid arteries.   CTA neck:  November 22, 2019   1. The bilateral common carotid, internal carotid and vertebral arteries are patent within the  neck without significant stenosis (50% or greater).  2. Mixed plaque within the carotid bifurcations and proximal internal carotid arteries bilaterally. There is possible ulcerated plaque within the right carotid bulb. There is lobular dilation of the bilateral carotid bulbs. Additionally, there is significant tortuosity of the proximal left ICA.  3. 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 Please correlate with findings on concurrently performed CTA chest/abdomen/pelvis.  4. 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  5. 1.8 cm nodule within the right thyroid lobe. Nonemergent dedicated thyroid ultrasound is recommended for further evaluation, as clinically appropriate.  ADDENDUM: The presence of a thoracic aortic aneurysm, lobular dilation of both carotid bulbs and 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   CT perfusion head 2019/11/22    The perfusion software identifies a 122 mL core infarct within the right MCA vascular territory. The perfusion software identifies a 139 mL region of critically hypoperfused parenchyma within the right MCA vascular territory. Reported mismatch volume 17 mL.   MR BRAIN WO CONTRAST 11/22/2019 IMPRESSION:  1. Motion degraded exam.  2. Acute ischemic infarction involving much of the right MCA vascular territory. Only mild mass effect at this time. Mild petechial hemorrhage within portions of the right parietal infarction territory.  3. Redemonstrated small chronic cortically based infarcts within the left parietooccipital lobes.  4. Moderate/advanced chronic small vessel ischemic disease with multiple chronic lacunar infarcts, progressed from MRI 07/08/2009.  5. Mild generalized parenchymal atrophy.  6. Air-fluid level and frothy secretions within the left maxillary sinus. Correlate for acute sinusitis.  CT HEAD WO  CONTRAST 11/15/2019 IMPRESSION:  1. Large right MCA infarct with no hemorrhagic transformation.  2. Status post right hemi-craniectomy with mild intracranial mass effect. 5 mm of leftward midline shift. Patent basilar cisterns.  3. Underlying chronic small vessel disease.  CT HEAD WO CONTRAST 11/17/2019 1. Stable since 11/15/2019. Large right MCA infarct with no malignant hemorrhagic transformation. Stable 5 mm of leftward midline shift. 2. No new intracranial abnormality.   CT HEAD WO CONTRAST 11/19/2019 1. Continued interval evolution of large right MCA territory infarct without evidence for hemorrhagic transformation. Slight interval increase in swelling of brain parenchyma through the craniectomy defect, but with similar 4-5 mm right-to-left midline shift. 2. No  other new acute intracranial abnormality.   CT Angio Chest/Abd/Pel for Dissection W and/or W/WO 18-Nov-2019 1. 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 No dissection or intramural hematoma.  2. 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 Recommend followup by abdomen and pelvis CTA in 6 months, and vascular surgery referral/consultation if not already obtained. This recommendation follows ACR consensus guidelines: White Paper of the ACR Incidental Findings Committee II on Vascular Findings. J Am Coll Radiol 2013; 10:789-794. Aortic aneurysm NOS (ICD10-I71.9)  3. 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  4. Cirrhotic changes involving the liver. No worrisome hepatic lesions.  5. Emphysematous changes and pulmonary scarring but no acute pulmonary findings.  6. 17 mm right thyroid nodule. Recommend thyroid US and potential biopsy (ref: J Am Coll Radiol. 2015 Feb;12(2): 143-50).  7. Aortic Atherosclerosis (ICD10-I70.0) and Emphysema (ICD10-J43.9).   DG  CHEST PORT 1 VIEW 11/20/2019 pending   11/22/2019 Stable hazy opacities in both lungs likely reflecting a combination of atelectasis with layering left effusion. Lines and tubes. 11/21/2019 Persistent basilar opacities, favor atelectasis though more confluent opacity in the retrocardiac space could reflect airspace disease. 11/18/2019 1. Unchanged retrocardiac opacity with volume loss, may be atelectasis, pneumonia or aspiration. Perihilar atelectasis on the right. 2. Stable support apparatus allowing for patient rotation.  11/17/2019 Retrocardiac opacity with volume loss suggesting atelectasis. Given the history, superimposed infection is not excluded. Unremarkable hardware November 18, 2019 No active disease.  Aortic atherosclerosis, tortuosity and ectasia.  11-18-2019 1.  Lungs clear.  2. Prominence of the descending aorta with questionable degree of aneurysmal dilatation distally.   ECHOCARDIOGRAM COMPLETE 11/18/2019 1. Left ventricular ejection fraction, by estimation, is 60 to 65%. The left ventricle has normal function. The left ventricle has no regional wall motion abnormalities. Indeterminate diastolic filling due to E-A fusion.   2. Right ventricular systolic function is normal. The right ventricular size is normal. Tricuspid regurgitation signal is inadequate for assessing PA pressure.   3. The mitral valve is normal in structure and function. No evidence of mitral valve regurgitation. No evidence of mitral stenosis.   4. The aortic valve is tricuspid. Aortic valve regurgitation is not visualized. Mild aortic valve sclerosis is present, with no evidence of aortic valve stenosis.   5. The inferior vena cava is normal in size with greater than 50% respiratory variability, suggesting right atrial pressure of 3 mmHg.   6. Technically difficult study with poor acoustic windows.   EEG adult 11/18/2019 This technically difficult study showed evidence of epileptogenicity in left anterior temporal  region as well as moderate to severe diffuse encephalopathy, non specific to etiology. No seizures  were seen throughout the recording. If concern for ictal and interictal activity persists, please consider repeat prolonged study.   ECG - ST rate 109 BPM. (See cardiology reading for complete details)  PHYSICAL EXAM   Temp:  [99.2 F (37.3 C)-100.1 F (37.8 C)] 100.1 F (37.8 C) (02/27 0800) Pulse Rate:  [62-98] 62 (02/27 0900) Resp:  [20-33] 21 (02/27 0900) BP: (91-137)/(57-72) 107/59 (02/27 0900) SpO2:  [90 %-97 %] 96 % (02/27 0900) FiO2 (%):  [40 %] 40 % (02/27 0832) Weight:  [72 kg] 72 kg (02/27 0500)  General - Well nourished, well developed, intubated on precedex and fentanyl.  Ophthalmologic - fundi not visualized due to noncooperation.  Cardiovascular - Regular rate and rhythm without  stimulation  Neuro - intubated on precedex and fentanyl, eyes open on voice, barely following limited simple commands on the right hand (showing thumb) and right foot (weakly wiggled toes). Moving right side purposefully, With eye opening, eyes right gaze preference, not cross midline, no nystagmus. Not blinking to visual threat bilaterally, PERRL. Corneal reflex present, gag and cough present. Breathing over the vent.  Facial symmetry not able to test due to ET tube.  Tongue protrusion not cooperative. RUE and RLE spontaneous movement, but not against gravity. On pain stimulation, LUE 0/5 and LLE slight withdraw. DTR 1+ and positive babinski bilaterally. Sensation, coordination and gait not tested.   ASSESSMENT/PLAN Ms. Paula Kim is a 64 y.o. female with history of HLD found down presenting with L sided weakness and dysarthria.  Stroke:   Large R MCA infarct w/ petechial hemorrhage R parietal s/p decompressive hemicrani - source unclear, ? large vessel vasculopathy  CT head extensive hypodensity R MCA territory w/ infarct.  CTA head proximal R M1 occlusion. Posterior circulation  atherosclerosis - L VA stenosis vs focal occlusion. B mid P2 PCA moderate to severe stenoses.  CTA neck mixed plaque bifurcations and proximal B ICAs. Possible ulcerated plaque R ICA bulb. Lobar dilation B ICA bulbs. L ICA tortuous. 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   CT perfusion positive for penumbra  MRI  R MCA infarct w/ R parietal petechial hemorrhage. Chronic L parietooccipital infarcts.   CT repeat 2/21 - stable 55mm MLS  CT repeat 2/23 - slight interval increase in brain swelling through crani, interval evolution, stable 4 to 5 mm MLS  LE venous doppler - negative for DVT bilaterally  2D Echo EF 60-65%. No source of embolus   Consider 30 day cardiac event monitoring as outpt  LDL 70  HgbA1c 5.0  Heparin 5000 units sq tid for VTE prophylaxis  aspirin 325 mg daily prior to admission, now on aspirin 325 mg daily. No DAPT given potential trach/PEG next week  Therapy recommendations:  Pending. Reorder when able to evaluate  Disposition:  pending   Acute Respiratory Failure  Intubated  On weaning   Sedated with precedex and fentanyl  Klonopin added 0.25 bid  CCM onboard  Not candidate for extubation at this time  likely require trach next week  Cerebral Edema  S/p Right decompression hemicrani by Cabbell on 10/28/2019  CT 2/21 stable 74mm MLS  CT repeat 2/23 - slight interval increase in brain swelling through crani, interval evolution, stable 4-5 mm MLS  On Keppra for seizure prevention  On 3% saline -> off -> NS @40  -> 1/2NS @40  ->NS @40   Na 140->...>152->151->160->152->151->148  Seizure like activity  Stimulation induced increased muscle tone at LUE and tonic posturing of RUE and RLE.   EEG left frontal sharp waves  On keppra 500 -> increase to 1000 bid  Repeat EEG 2/25 L anterior temporal lobe sharp waves  Continue keppra  Fever  Possible evolving LLL PNA  Tmax  101.2->100.6->101->100.5->100.1  On ice pack  Mild Leukocytosis, - 11.1->14.8->7.6->6.6->9.9->10.0->8.7->8.5  Blood culture 2/22 no growth 2 days   UA neg   CXR stable hazy opacities, ? Retrocardiac infiltrate  Surgical wound clear dry, no pus  Resp cx GPC in clusters, GPR  On rocephin 2/26>>2/26   On cefepime  2/26>>  On vanc 2/26>>  Acute blood loss anemia, critical illness   Hb - 13->7.6->8.2->7.1->6.9->1u PRBC-> 8.3->8.1->7.9->7.8->7.4->7.0 - may need to transfuse again  Stool guaiac pending  (will  ask nurses to try and collect specimen)  Close monitoring CBC  Iron panel Fer ok, Fe 27, TIBC 172   On iron supplement  CCM on board  Hypotension  Off Levophed  Stable on the lower end . SBP goal 110-180 . Long-term BP goal normotensive  Hyperlipidemia  Home meds:  pravachol 40, fish oil, fenofibrate  LDL 70, goal < 70  On Crestor 20  Continue statin on discharge  Dysphagia At risk malnutrition . Secondary to stroke . NPO . on TF @ 55 . IVF NS @ 40 . 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 F/u in 6 mos. 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   Repeat in 6 months  Other Stroke Risk Factors  Former Cigarette smoker, quit 13 yrs ago  Substance abuse - UDS:  THC POSITIVE, Will advise to stop using due to stroke risk.  Hx stroke/TIA  06/2009 - midbrain and L PCA territory infarcts. Small vessel disease.    Other Active Problems  Hx skew deviation since childhood, worsened post stroke  Hypokalemia 2.7-3.6->3.4->3.8->2.8->3.3->3.1->3.3->2.8 supplement->3.1 supplement and recheck  R thyroid nodule. Nonemergent [place] recommended  Depression on Effexor  Hospital day # 9 This patient is critically ill and at significant risk of neurological  worsening, death and care requires constant monitoring of vital signs, hemodynamics,respiratory and cardiac monitoring,review of multiple databases, neurological assessment, discussion with family, other specialists and medical decision making of high complexity.I  I spent 30 minutes of neurocritical care time in the care of this patient.  Naomie Dean, MD [facility]  To contact Stroke Continuity provider, please refer to [email or web address]. After hours, contact General Neurology

## 2019-11-23 NOTE — Progress Notes (Signed)
PCCM progress note   Called and updated Niece Sheran Luz over the phone this afternoon regarding goals of care. It was expressed that patient is having difficulty weaning in the setting of a large stroke and that patient will likely require aggressive measures such as tracheostomy in order to attempt liberation from ventilator. Family has questions regarding quality of life moving forward and this was discussed in detail. Family then made the decision to change code status to DNR but wished to continue current treatment until family meeting can be held. Family will attempt to meet early next week and make a decision on placement of tracheostomy. The role of palliative medicine team was discussed and the family will let the clinical team know when/if this service is needed.   Delfin Gant, NP-C  Pulmonary & Critical Care Contact / Pager information can be found on Amion  11/23/2019, 2:37 PM

## 2019-11-23 NOTE — Progress Notes (Signed)
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  On imaging she already had a large right MCA territory infarct.  She underwent a decompressive craniectomy on the evening of 2/18.   History of present illness   Unfortunately we have very little history on this 15 year old.  A friend tried to call her on the morning of admission, as she usually does at 6 AM, and when she was unable to awaken the patient went to visit and found her unresponsive.  She was brought to our department of emergency medicine where a CT and CTA were performed.  She has a large right MCA infarct and an M1 occlusion.  She was not a candidate for either thrombolytics or thrombectomy and she was taken to the operating room for craniectomy on the evening of 2/18.    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 Past surgical history is remarkable for a laparoscopic cholecystectomy.  Significant Hospital Events   Admission 11/15/2019.  Decompressive craniectomy 2/18  Consults:  Neurosurgery Neurology  Procedures:  Decompressive craniectomy 2/18  Significant Diagnostic Tests:  CT and CTA head 2/19 >> showing a large right MCA infarct and M1 occlusion  CT head 2/21 >> 1. Stable no new intracranial abnormality.  EEG 2/22 >> left anterior temporal sharp waves with epileptogenicity and no overt seizures  CT head 2/23 >> continued interval evolution of large right MCA territory infarct without hemorrhage, slight increase in parenchymal edema through the craniectomy defect, similar 4 to 5 mm right to left midline shift  EEG 2/25 >>  Left anterior temporal epileptogenicity  without overt seizures  Chest x-ray 2/27 >> Persistent left lower lobe consolidation and left effusion unchanged. Support lines remain in good position.  Micro Data:  COVID 2/18 > Negative MRSA PCR 2/18 > Negative Blood 2/22 > negative Urine 2/22 > appears uncollected Respiratory 2/24 >> few GNR, moderate staph aureus >> resistant ampicillin  Antimicrobials:  Ceftriaxone 2/25 >> 2/26 Vancomycin 2/26 >> Cefepime 2/26 >>   Interim history/subjective:  RN reports no acute events overnight, remains sedated on ventilator.  10 L positive since admission  Objective   Blood pressure (!) 99/58, pulse 66, temperature 100.1 F (37.8 C), temperature source Axillary, resp. rate (!) 24, height 5\' 1"  (1.549 m), weight 72 kg, SpO2 96 %.    Vent Mode: CPAP;PSV FiO2 (%):  [40 %] 40 % Set Rate:  [16 bmp] 16 bmp Vt Set:  [380 mL] 380 mL PEEP:  [5 cmH20] 5 cmH20 Pressure Support:  [8 cmH20] 8 cmH20   Intake/Output Summary (Last 24 hours) at 11/23/2019 0851 Last data filed at 11/23/2019 0700 Gross per 24 hour  Intake 3729.13 ml  Output 2000 ml  Net 1729.13 ml   Filed Weights   11/19/19 0500 11/20/19 0500 11/23/19 0500  Weight: 66.3 kg 68.6 kg 72 kg    Examination: General: Chronically ill appearing elderly female on mechanical ventilation, in NAD HEENT: ETT, MM pink/moist, PERRL, craniotomy site clean dry and intact, staples remain in place, no signs of infection Neuro: Awakes to verbal stimuli, follows very simple commands, no movement in LUE, nonpurposeful movement to LLE CV: s1s2  regular rate and rhythm, no murmur, rubs, or gallops,  PULM: Clear to auscultation bilaterally, negative breath sounds, no increased work of breathing, oxygen saturations 90-96 on 40 FiO2 GI: soft, bowel sounds active in all 4 quadrants, non-tender, non-distended, tolerating TF Extremities: warm/dry, trace lower extremity edema  Skin: no rashes or lesions   Resolved problems:  Circulatory Shock, resolved -  suspect from sedation, possibly some vasoplegia.   Assessment & Plan:  This is a 64 year old who is suffered from a large right MCA territory infarct and underwent decompressive craniotomy on 11/30/2019.  Acute Hypoxemic Respiratory Failure -Due to large right MCA infarct resulting in coma Possible evolving left lower lobe pneumonia.   -Chest x-ray 2/25 reviewed, question retrocardiac infiltrate P: Continue ventilator support with lung protective strategies  Poor neurological status, encephalopathy, and agitation continue to remain barrier to extubation additionally it is difficult to assess airway protection bulbar function post stroke High suspicion for requirement of tracheostomy in the future Follow respiratory cultures Continue IV antibiotics Wean PEEP and FiO2 for sats greater than 90%. Head of bed elevated 30 degrees. Plateau pressures less than 30 cm H20.  Follow intermittent chest x-ray and ABG.   Ensure adequate pulmonary hygiene  Follow cultures  VAP bundle in place  PAD protocol  Large R MCA infarct w/ petechial hemorrhage R parietal  Cerebral edema  Epileptiform activity on EEG 2/22 without overt seizures. -s/p decompressive hemicrani 11/30/2019 -Question whether she may be having intermittent seizure activity P: Neurology neurosurgery following, appreciate assistance Maintain neuro protective measures; eurothermia, euglycemia, eunatermia, normoxia Nutrition and bowel regiment  Seizure precautions  Continue Keppra per neurology S/p hypertonic saline therapy, sodium slightly down drifting 148 on 2/27  Hypokalemia P: Trend bmet Replete as needed  Anemia of critical disease -no clear source for acute blood loss.  Hemoglobin 7.0 on 2/27 P: Trend CBC Transfuse per protocol Hemoglobin goal greater than 7  Acute metabolic encephalopathy.  -Suspect multifactorial from delirium, sedating medications and acute stroke. Depression P: Patient is experiencing  agitated delirium resulting in barrier to weaning Continue to wean as tolerated as above Continue to minimize sedation, currently on Precedex fentanyl Continue low-dose clonazepam Continue venalfaxine    At risk malnutrition due to inadequate oral intake P: Continue tube feeds Protein supplementation Nutrition following  Best practice:  Diet: tube feeds Pain/Anxiety/Delirium protocol (if indicated): precedex and fentanyl  VAP protocol (if indicated): In place DVT prophylaxis: SCDs only due bleeding risk  GI prophylaxis: Pepcid Glucose control: None required Mobility: Bed rest Code Status: Full Family Communication: Pending Disposition: maintain ICU  Labs   CBC: Recent Labs  Lab 11/19/19 1815 11/20/19 0238 11/21/19 0519 11/22/19 0422 11/23/19 0621  WBC 9.9 10.0 8.7 8.9 8.5  NEUTROABS 7.0  --   --   --   --   HGB 8.1* 7.9* 7.8* 7.4* 7.0*  HCT 25.6* 24.9* 24.7* 23.8* 23.2*  MCV 96.2 96.1 98.0 97.9 100.0  PLT 196 194 188 200 221    Basic Metabolic Panel: Recent Labs  Lab 11/17/19 0349 11/17/19 0950 11/19/19 1815 11/20/19 0238 11/21/19 0519 11/22/19 0422 11/23/19 0621  NA 160*   < > 152* 152* 152* 151* 148*  K 3.4*   < > 3.3* 3.1* 3.3* 2.8* 3.1*  CL 126*   < > 121* 121* 121* 119* 119*  CO2 24   < > 26 24 23 22 22   GLUCOSE 141*   < > 145* 127* 144* 176* 141*  BUN 18   < >  22 20 22 21 19   CREATININE 0.58   < > 0.62 0.61 0.80 0.55 0.32*  CALCIUM 8.3*   < > 7.9* 7.6* 8.0* 7.6* 7.5*  MG  --   --   --   --  2.3 2.1  --   PHOS 3.0  --   --   --   --   --   --    < > = values in this interval not displayed.   GFR: Estimated Creatinine Clearance: 65.3 mL/min (A) (by C-G formula based on SCr of 0.32 mg/dL (L)). Recent Labs  Lab 11/20/19 0238 11/21/19 0519 11/22/19 0422 11/23/19 0621  WBC 10.0 8.7 8.9 8.5    Liver Function Tests: No results for input(s): AST, ALT, ALKPHOS, BILITOT, PROT, ALBUMIN in the last 168 hours. No results for input(s): LIPASE,  AMYLASE in the last 168 hours. No results for input(s): AMMONIA in the last 168 hours.  ABG    Component Value Date/Time   PHART 7.374 11/22/2019 2112   PCO2ART 41.0 11/11/2019 2112   PO2ART 114.0 (H) 11/19/2019 2112   HCO3 23.9 10/29/2019 2112   TCO2 25 11/15/2019 2112   ACIDBASEDEF 1.0 11/18/2019 2112   O2SAT 98.0 11/21/2019 2112     Coagulation Profile: No results for input(s): INR, PROTIME in the last 168 hours.  Cardiac Enzymes: No results for input(s): CKTOTAL, CKMB, CKMBINDEX, TROPONINI in the last 168 hours.  HbA1C: Hgb A1c MFr Bld  Date/Time Value Ref Range Status  11/15/2019 03:45 AM 5.0 4.8 - 5.6 % Final    Comment:    (NOTE) Pre diabetes:          5.7%-6.4% Diabetes:              >6.4% Glycemic control for   <7.0% adults with diabetes     CBG: Recent Labs  Lab 11/22/19 1615 11/22/19 1908 11/22/19 2315 11/23/19 0303 11/23/19 0718  GLUCAP 151* 140* 141* 142* 142*   CRITICAL CARE Performed by: 11/25/19  Total critical care time: 35 minutes  Critical care time was exclusive of separately billable procedures and treating other patients.  Critical care was necessary to treat or prevent imminent or life-threatening deterioration.  Critical care was time spent personally by me on the following activities: development of treatment plan with patient and/or surrogate as well as nursing, discussions with consultants, evaluation of patient's response to treatment, examination of patient, obtaining history from patient or surrogate, ordering and performing treatments and interventions, ordering and review of laboratory studies, ordering and review of radiographic studies, pulse oximetry and re-evaluation of patient's condition.  Delfin Gant, NP-C [facility] Contact / Pager information can be found on Amion  11/23/2019, 9:05 AM

## 2019-11-23 NOTE — Progress Notes (Signed)
eLink Physician-Brief Progress Note Patient Name: Renay Crammer DOB: [birth year] MRN: [id number]   Date of Service  11/23/2019  HPI/Events of Note  Blood sugar 165 mg %, Pt is getting Q 4 hour BG checks, no prior history of diabetes mellitus.  eICU Interventions  Hemoglobin A1c ordered, Q 4 hourly sliding scale insulin coverage ordered.        Thomasene Lot Ogan 11/23/2019, 10:54 PM

## 2019-11-23 NOTE — Progress Notes (Signed)
Pharmacy Antibiotic Note  Paula Kim is a 64 y.o. female admitted on 2019/12/12 with pneumonia.  Pharmacy has been consulted for Cefazolin dosing.  Respiratory culture from 2/24 with moderate MSSA and few raoultella planticola - both organisms sensitive to Cefazolin. WBC is within normal limits. Patien is afebrile. SCr is stable and patient is making good amount of urine.   Plan: Cefazolin 2g IV every 8 hours.  Monitor renal function, any further culture results, and clinical status.  Follow-up length of therapy - 5-7 days.   Height: 5\' 1"  (154.9 cm) Weight: 158 lb 11.7 oz (72 kg) IBW/kg (Calculated) : 47.8  Temp (24hrs), Avg:99.5 F (37.5 C), Min:99.1 F (37.3 C), Max:100.1 F (37.8 C)  Recent Labs  Lab 11/19/19 1815 11/20/19 0238 11/21/19 0519 11/22/19 0422 11/23/19 0621  WBC 9.9 10.0 8.7 8.9 8.5  CREATININE 0.62 0.61 0.80 0.55 0.32*    Estimated Creatinine Clearance: 65.3 mL/min (A) (by C-G formula based on SCr of 0.32 mg/dL (L)).    Allergies  Allergen Reactions  . Erythromycin Swelling and Rash    Antimicrobials this admission: CTX 2/25 >> 2/26 Vanc 2/26 >> 2/27 Cefepime 2/26 >>2/27 Cefazolin 2/27 >>  Dose adjustments this admission:   Microbiology results: 2/18 covid / flu / MRSA - negative 2/22 BCx - negative 2/24 sputum - mod MSSA + few Raoultella planticola (soil/water org)? >> both sens Ancef   Thank you for allowing pharmacy to be a part of this patient's care.  3/24, PharmD, BCPS, BCCCP Clinical Pharmacist Clinical phone 11/23/2019 until [phone] Please refer to [facility] for [facility] numbers 11/23/2019 1:14 PM

## 2019-11-24 DIAGNOSIS — I63411 Cerebral infarction due to embolism of right middle cerebral artery: Secondary | ICD-10-CM

## 2019-11-24 LAB — GLUCOSE, CAPILLARY
Glucose-Capillary: 114 mg/dL — ABNORMAL HIGH (ref 70–99)
Glucose-Capillary: 112 mg/dL — ABNORMAL HIGH (ref 70–99)
Glucose-Capillary: 145 mg/dL — ABNORMAL HIGH (ref 70–99)
Glucose-Capillary: 125 mg/dL — ABNORMAL HIGH (ref 70–99)
Glucose-Capillary: 151 mg/dL — ABNORMAL HIGH (ref 70–99)
Glucose-Capillary: 120 mg/dL — ABNORMAL HIGH (ref 70–99)

## 2019-11-24 LAB — CBC
HCT: 24.7 % — ABNORMAL LOW (ref 36.0–46.0)
Hemoglobin: 7.4 g/dL — ABNORMAL LOW (ref 12.0–15.0)
MCH: 30.1 pg (ref 26.0–34.0)
MCHC: 30 g/dL (ref 30.0–36.0)
MCV: 100.4 fL — ABNORMAL HIGH (ref 80.0–100.0)
Platelets: 266 10*3/uL (ref 150–400)
RBC: 2.46 MIL/uL — ABNORMAL LOW (ref 3.87–5.11)
RDW: 14.3 % (ref 11.5–15.5)
WBC: 12.8 10*3/uL — ABNORMAL HIGH (ref 4.0–10.5)
nRBC: 0.8 % — ABNORMAL HIGH (ref 0.0–0.2)

## 2019-11-24 LAB — HEMOGLOBIN A1C
Hgb A1c MFr Bld: 5 % (ref 4.8–5.6)
Mean Plasma Glucose: 96.8 mg/dL

## 2019-11-24 LAB — BASIC METABOLIC PANEL
Anion gap: 9 (ref 5–15)
BUN: 19 mg/dL (ref 8–23)
CO2: 21 mmol/L — ABNORMAL LOW (ref 22–32)
Calcium: 7.8 mg/dL — ABNORMAL LOW (ref 8.9–10.3)
Chloride: 115 mmol/L — ABNORMAL HIGH (ref 98–111)
Creatinine, Ser: 0.54 mg/dL (ref 0.44–1.00)
GFR calc Af Amer: >60 mL/min (ref >60)
GFR calc non Af Amer: >60 mL/min (ref >60)
Glucose, Bld: 122 mg/dL — ABNORMAL HIGH (ref 70–99)
Potassium: 3.8 mmol/L (ref 3.5–5.1)
Sodium: 145 mmol/L (ref 135–145)

## 2019-11-24 LAB — OCCULT BLOOD X 1 CARD TO LAB, STOOL: Fecal Occult Bld: NEGATIVE

## 2019-11-24 MED ORDER — BISACODYL 5 MG PO TBEC: 5.0000 mg | DELAYED_RELEASE_TABLET | Freq: Every day | ORAL | Status: DC | PRN

## 2019-11-24 MED ORDER — DOCUSATE SODIUM 50 MG/5ML PO LIQD: 100.0000 mg | Freq: Two times a day (BID) | ORAL | Status: DC | PRN

## 2019-11-24 NOTE — Progress Notes (Signed)
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  On imaging she already had a large right MCA territory infarct.  She underwent a decompressive craniectomy on the evening of 2/18.   History of present illness   Unfortunately we have very little history on this 7 year old.  A friend tried to call her on the morning of admission, as she usually does at 6 AM, and when she was unable to awaken the patient went to visit and found her unresponsive.  She was brought to our department of emergency medicine where a CT and CTA were performed.  She has a large right MCA infarct and an M1 occlusion.  She was not a candidate for either thrombolytics or thrombectomy and she was taken to the operating room for craniectomy on the evening of 2/18.    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 Past surgical history is remarkable for a laparoscopic cholecystectomy.  Significant Hospital Events   Admission 11/25/19.  Decompressive craniectomy 2/18  Consults:  Neurosurgery Neurology  Procedures:  Decompressive craniectomy 2/18  Significant Diagnostic Tests:  CT and CTA head 2/19 >> showing a large right MCA infarct and M1 occlusion  CT head 2/21 >> 1. Stable no new intracranial abnormality.  EEG 2/22 >> left anterior temporal sharp waves with epileptogenicity and no overt seizures  CT head 2/23 >> continued interval evolution of large right MCA territory infarct without hemorrhage, slight increase in parenchymal edema through the craniectomy defect, similar 4 to 5 mm right to left midline shift  EEG 2/25 >>  Left anterior temporal  epileptogenicity without overt seizures  Chest x-ray 2/27 >> Persistent left lower lobe consolidation and left effusion unchanged. Support lines remain in good position.  Micro Data:  COVID 2/18 > Negative MRSA PCR 2/18 > Negative Blood 2/22 > negative Urine 2/22 > appears uncollected Respiratory 2/24 >> few GNR, moderate staph aureus >> resistant ampicillin  Antimicrobials:  Ceftriaxone 2/25 >> 2/26 Vancomycin 2/26 >> 2/27 Cefepime 2/26 >> 2/27 Cefazolin 2/27 >>   Interim history/subjective:  Night shift RN reports large bowel movement after suppository with red streaks questionable for blood.  Hemoglobin is stable condition.  Remains 11 L positive  Objective   Blood pressure (!) 86/55, pulse 70, temperature 98.7 F (37.1 C), temperature source Oral, resp. rate 20, height 5\' 1"  (1.549 m), weight 72 kg, SpO2 96 %.    Vent Mode: CPAP;PSV FiO2 (%):  [40 %] 40 % Set Rate:  [16 bmp] 16 bmp Vt Set:  [380 mL] 380 mL PEEP:  [5 cmH20] 5 cmH20 Pressure Support:  [10 cmH20] 10 cmH20   Intake/Output Summary (Last 24 hours) at 11/24/2019 0842 Last data filed at 11/24/2019 0700 Gross per 24 hour  Intake 1736.62 ml  Output 1150 ml  Net 586.62 ml   Filed Weights   11/19/19 0500 11/20/19 0500 11/23/19 0500  Weight: 66.3 kg 68.6 kg 72 kg    Examination: General: Chronically ill appearing elderly deconditioned female on mechanical ventilation, in NAD HEENT: ETT, MM pink/moist, PERRL,  Neuro: Will open eyes to verbal stimuli, following simple commands in right upper extremity, left upper extremity remains  flaccid.  Nonpurposeful movement seen to the left lower extremity CV: s1s2 regular rate and rhythm, no murmur, rubs, or gallops,  PULM:   Clear to auscultation bilaterally, no added breath sounds, no increased work of breathing, oxygen saturations 96-98 on 40% FiO2 GI: soft, bowel sounds active in all 4 quadrants, non-tender, non-distended, tolerating TF Extremities: warm/dry, trace  lower extremity edema edema  Skin: no rashes or lesions  Resolved problems:  Circulatory Shock, resolved - suspect from sedation, possibly some vasoplegia.   Assessment & Plan:  This is a 64 year old who is suffered from a large right MCA territory infarct and underwent decompressive craniotomy on 11/23/2019.  Acute Hypoxemic Respiratory Failure -Due to large right MCA infarct resulting in coma Staph and Raoultella pneumonia.   -Chest x-ray 2/25 reviewed, question retrocardiac infiltrate P: Neurological exam appears slightly improved from day prior however does not appear to be sufficient to for extubation.  Additionally unable to assess airway protection work with bulbar function post stroke Family currently attempting to make decision on tracheostomy placement Continue ventilator support with lung protective strategies, SBT as tolerated Wean PEEP and FiO2 for sats greater than 90%. Head of bed elevated 30 degrees. Plateau pressures less than 30 cm H20.  Follow intermittent chest x-ray and ABG.   Ensure adequate pulmonary hygiene  Antibiotics the escalated to cefazolin VAP bundle in place  PAD protocol  Large R MCA infarct w/ petechial hemorrhage R parietal  Cerebral edema  Epileptiform activity on EEG 2/22 without overt seizures. -s/p decompressive hemicrani 11/13/2019 -Question whether she may be having intermittent seizure activity P: Neurosurgery and neurology following, appreciate assistance Maintain neuro protective measures; eurothermia, euglycemia, eunatermia, normoxia, and PCO2 goal of 35-40 Nutrition and bowel regiment  Seizure precautions  Continue Keppra per neurology S/P hypertonic saline therapy, sodium currently 145  Hypokalemia P: Trend Bmet Supplement as needed  Anemia of critical disease -no clear source for acute blood loss.  Hemoglobin 7.0 on 2/27 Questionable lower GI bleed -Overnight RN reports large bowel movement after suppository administration  with appearance of blood in stool.  Hemoccult stool negative P: Trend CBC Transfuse per protocol Hemoglobin goal greater than 7 Observe for any further blood in stool  Acute metabolic encephalopathy.  -Suspect multifactorial from delirium, sedating medications and acute stroke. Depression P: Patient has experienced agitation during weaning over the last several days Currently on fentanyl and Precedex drip Continue low-dose clonazepam and venalfaxine Continue SBT as tolerated  At risk malnutrition due to inadequate oral intake P: Continue tube feeds Protein supplementation Nutrition following  Goals of care Long discussion held with patient's niece 2/27 regarding plan of care.  Reported to family that patient is having difficulty weaning on the setting of agitation and large stroke.  Patient will likely need tracheostomy placement in order to assist in liberation from ventilator.  Family had concerns regarding quality of life post tracheostomy placement and requested additional time to consider options.  Patient's family stated they will attempt to visit Monday versus Tuesday and make final decision on tracheostomy  Best practice:  Diet: tube feeds Pain/Anxiety/Delirium protocol (if indicated): precedex and fentanyl  VAP protocol (if indicated): In place DVT prophylaxis: SCDs only due bleeding risk  GI prophylaxis: Pepcid Glucose control: None required Mobility: Bed rest Code Status: Full Family Communication: We will attempt to update again today 2/28 Disposition: maintain ICU  Labs   CBC: Recent Labs  Lab 11/19/19 1815 11/19/19 1815 11/20/19 1157 11/21/19 2620 11/22/19 0422 11/23/19 3559 11/24/19 7416  WBC 9.9   < > 10.0 8.7 8.9 8.5 12.8*  NEUTROABS 7.0  --   --   --   --   --   --   HGB 8.1*   < > 7.9* 7.8* 7.4* 7.0* 7.4*  HCT 25.6*   < > 24.9* 24.7* 23.8* 23.2* 24.7*  MCV 96.2   < > 96.1 98.0 97.9 100.0 100.4*  PLT 196   < > 194 188 200 221 266   < > = values  in this interval not displayed.    Basic Metabolic Panel: Recent Labs  Lab 11/20/19 0238 11/21/19 0519 11/22/19 0422 11/23/19 0621 11/24/19 0545  NA 152* 152* 151* 148* 145  K 3.1* 3.3* 2.8* 3.1* 3.8  CL 121* 121* 119* 119* 115*  CO2 24 23 22 22  21*  GLUCOSE 127* 144* 176* 141* 122*  BUN 20 22 21 19 19   CREATININE 0.61 0.80 0.55 0.32* 0.54  CALCIUM 7.6* 8.0* 7.6* 7.5* 7.8*  MG  --  2.3 2.1  --   --    GFR: Estimated Creatinine Clearance: 65.3 mL/min (by C-G formula based on SCr of 0.54 mg/dL). Recent Labs  Lab 11/21/19 0519 11/22/19 0422 11/23/19 0621 11/24/19 0545  WBC 8.7 8.9 8.5 12.8*    Liver Function Tests: No results for input(s): AST, ALT, ALKPHOS, BILITOT, PROT, ALBUMIN in the last 168 hours. No results for input(s): LIPASE, AMYLASE in the last 168 hours. No results for input(s): AMMONIA in the last 168 hours.  ABG    Component Value Date/Time   PHART 7.374 11/22/2019 2112   PCO2ART 41.0 11/20/2019 2112   PO2ART 114.0 (H) 11/08/2019 2112   HCO3 23.9 11/20/2019 2112   TCO2 25 11/09/2019 2112   ACIDBASEDEF 1.0 11/19/2019 2112   O2SAT 98.0 11/07/2019 2112     Coagulation Profile: No results for input(s): INR, PROTIME in the last 168 hours.  Cardiac Enzymes: No results for input(s): CKTOTAL, CKMB, CKMBINDEX, TROPONINI in the last 168 hours.  HbA1C: Hgb A1c MFr Bld  Date/Time Value Ref Range Status  11/24/2019 05:45 AM 5.0 4.8 - 5.6 % Final    Comment:    (NOTE) Pre diabetes:          5.7%-6.4% Diabetes:              >6.4% Glycemic control for   <7.0% adults with diabetes   11/15/2019 03:45 AM 5.0 4.8 - 5.6 % Final    Comment:    (NOTE) Pre diabetes:          5.7%-6.4% Diabetes:              >6.4% Glycemic control for   <7.0% adults with diabetes     CBG: Recent Labs  Lab 11/23/19 1509 11/23/19 1914 11/23/19 2302 11/24/19 0324 11/24/19 0712  GLUCAP 146* 165* 143* 114* 112*   CRITICAL CARE Performed by: 12/03/2019  Total  critical care time: 37 minutes  Critical care time was exclusive of separately billable procedures and treating other patients.  Critical care was necessary to treat or prevent imminent or life-threatening deterioration.  Critical care was time spent personally by me on the following activities: development of treatment plan with patient and/or surrogate as well as nursing, discussions with consultants, evaluation of patient's response to treatment, examination of patient, obtaining history from patient or surrogate, ordering and performing treatments and interventions, ordering and review of laboratory studies, ordering and review of radiographic studies, pulse oximetry and re-evaluation of patient's  condition.  Delfin Gant, NP-C [facility] Contact / Pager information can be found on Amion  11/24/2019, 8:42 AM

## 2019-11-24 NOTE — Progress Notes (Signed)
PCCM progress note  Family discuss held again today with patients Niece Paula Kim over the phone. Mrs. Paula Kim continues to wean well on the ventilator. Over the past several days mentation has remained a barrier to extubation. However, today Mrs. Paula Kim shows some improvement with more alertness and ability to follow commands. Therefore, the option for trial of extubation prior any aggressive interventions was discussed with family. At that time the family reported that they have been discussing the options and they believe that Mrs. Paula Kim would not want aggressive measures such as a tracheostomy. The decision was made to trial extubation tomorrow with transition to comfort measures if respiratory failure occurs. The Family is trying to coordinate responsibilities to try and be present at bedside. Tentative plan for extubation tomorrow.   Delfin Gant, NP-C [facility] Contact / Pager information can be found on Amion  11/24/2019, 4:58 PM

## 2019-11-24 NOTE — Progress Notes (Signed)
STROKE TEAM PROGRESS NOTE   INTERVAL HISTORY RN at bedside.  Patient no neuro changes, neuro stable, still able to open eyes on voice, barely follow commands on the right hand and foot but moving right side purposefully, still not a candidate for extubation today, likely need to trach next week.  EEG no seizure but still has left anterior temporal sharp waves mixed with generalized slowing background.   Vitals:   11/24/19 0400 11/24/19 0500 11/24/19 0600 11/24/19 0700  BP: (!) 154/91 (!) 94/56 116/69 97/68  Pulse: (!) 126 76 75 68  Resp: (!) 29 (!) 22 (!) 28 (!) 27  Temp: 98.7 F (37.1 C)     TempSrc: Oral     SpO2: (!) 89% 90% 92% 92%  Weight:      Height:        CBC:  Recent Labs  Lab 11/19/19 1815 11/20/19 0238 11/23/19 0621 11/24/19 0545  WBC 9.9   < > 8.5 12.8*  NEUTROABS 7.0  --   --   --   HGB 8.1*   < > 7.0* 7.4*  HCT 25.6*   < > 23.2* 24.7*  MCV 96.2   < > 100.0 100.4*  PLT 196   < > 221 266   < > = values in this interval not displayed.    Basic Metabolic Panel:  Recent Labs  Lab 11/21/19 0519 11/21/19 0519 11/22/19 0422 11/22/19 0422 11/23/19 0621 11/24/19 0545  NA 152*   < > 151*   < > 148* 145  K 3.3*   < > 2.8*   < > 3.1* 3.8  CL 121*   < > 119*   < > 119* 115*  CO2 23   < > 22   < > 22 21*  GLUCOSE 144*   < > 176*   < > 141* 122*  BUN 22   < > 21   < > 19 19  CREATININE 0.80   < > 0.55   < > 0.32* 0.54  CALCIUM 8.0*   < > 7.6*   < > 7.5* 7.8*  MG 2.3  --  2.1  --   --   --    < > = values in this interval not displayed.   Lipid Panel:     Component Value Date/Time   CHOL 125 11/15/2019 0345   TRIG 151 (H) 11/15/2019 0345   HDL 25 (L) 11/15/2019 0345   CHOLHDL 5.0 11/15/2019 0345   VLDL 30 11/15/2019 0345   LDLCALC 70 11/15/2019 0345   HgbA1c:  Lab Results  Component Value Date   HGBA1C 5.0 11/24/2019   Urine Drug Screen:     Component Value Date/Time   LABOPIA NONE DETECTED 11/16/2019 1315   COCAINSCRNUR NONE DETECTED 11/10/2019  1315   LABBENZ NONE DETECTED 11/20/2019 1315   AMPHETMU NONE DETECTED 11/08/2019 1315   THCU POSITIVE (A) 11/24/2019 1315   LABBARB NONE DETECTED 11/09/2019 1315    Alcohol Level     Component Value Date/Time   ETH <10 11/12/2019 1030    IMAGING  CT head:  11/06/2019   1. Extensive abnormal hypodensity throughout much of the right MCA vascular territory consistent with acute/early subacute ischemic infarction. No evidence of hemorrhagic conversion. No significant mass effect at this time.  2. Background chronic ischemic changes as detailed  3. Mild generalized parenchymal atrophy.  4. Air-fluid level and frothy secretions within the left maxillary sinus. Correlate for acute sinusitis.   CT C-SPINE  NO CHARGE 11/03/2019 IMPRESSION:  1. No acute fracture or dislocation of the cervical spine.  2. Multilevel degenerative disc disease and facet arthropathy.  3. Thoracic aortic aneurysm. Please refer to forthcoming CT angiogram report for full evaluation of the vascular findings.  4. 1.8 cm right thyroid lobe nodule. Dedicated thyroid ultrasound is recommended on a nonemergent basis.  5. Centrilobular emphysema. Emphysema (ICD10-J43.9).   CTA head:  11/02/2019   1. 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 No significant reconstitution of the right M1 MCA more distally. There is minimal reconstitution of right M2 and more distal right MCA branches.  2. Posterior circulation atherosclerotic stenoses. Most notably, there is focal near occlusive stenosis versus focal occlusion of the distal left vertebral artery just proximal to the vertebrobasilar junction. Moderate to moderately severe stenoses within the mid P2 posterior cerebral arteries bilaterally.  3. Mild atherosclerotic plaque within the intracranial internal carotid arteries.   CTA neck:  11/13/2019   1. The bilateral common carotid, internal carotid and vertebral arteries are patent within the neck without  significant stenosis (50% or greater).  2. Mixed plaque within the carotid bifurcations and proximal internal carotid arteries bilaterally. There is possible ulcerated plaque within the right carotid bulb. There is lobular dilation of the bilateral carotid bulbs. Additionally, there is significant tortuosity of the proximal left ICA.  3. 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 Please correlate with findings on concurrently performed CTA chest/abdomen/pelvis.  4. 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  5. 1.8 cm nodule within the right thyroid lobe. Nonemergent dedicated thyroid ultrasound is recommended for further evaluation, as clinically appropriate.  ADDENDUM: The presence of a thoracic aortic aneurysm, lobular dilation of both carotid bulbs and 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   CT perfusion head 10/28/2019    The perfusion software identifies a 122 mL core infarct within the right MCA vascular territory. The perfusion software identifies a 139 mL region of critically hypoperfused parenchyma within the right MCA vascular territory. Reported mismatch volume 17 mL.   MR BRAIN WO CONTRAST 11/23/2019 IMPRESSION:  1. Motion degraded exam.  2. Acute ischemic infarction involving much of the right MCA vascular territory. Only mild mass effect at this time. Mild petechial hemorrhage within portions of the right parietal infarction territory.  3. Redemonstrated small chronic cortically based infarcts within the left parietooccipital lobes.  4. Moderate/advanced chronic small vessel ischemic disease with multiple chronic lacunar infarcts, progressed from MRI 07/08/2009.  5. Mild generalized parenchymal atrophy.  6. Air-fluid level and frothy secretions within the left maxillary sinus. Correlate for acute sinusitis.  CT HEAD WO  CONTRAST 11/15/2019 IMPRESSION:  1. Large right MCA infarct with no hemorrhagic transformation.  2. Status post right hemi-craniectomy with mild intracranial mass effect. 5 mm of leftward midline shift. Patent basilar cisterns.  3. Underlying chronic small vessel disease.  CT HEAD WO CONTRAST 11/17/2019 1. Stable since 11/15/2019. Large right MCA infarct with no malignant hemorrhagic transformation. Stable 5 mm of leftward midline shift. 2. No new intracranial abnormality.   CT HEAD WO CONTRAST 11/19/2019 1. Continued interval evolution of large right MCA territory infarct without evidence for hemorrhagic transformation. Slight interval increase in swelling of brain parenchyma through the craniectomy defect, but with similar 4-5 mm right-to-left midline shift. 2. No other new acute intracranial abnormality.   CT Angio Chest/Abd/Pel for Dissection W and/or W/WO  12/01/2019 1. 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 No dissection or intramural hematoma.  2. 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 Recommend followup by abdomen and pelvis CTA in 6 months, and vascular surgery referral/consultation if not already obtained. This recommendation follows ACR consensus guidelines: White Paper of the ACR Incidental Findings Committee II on Vascular Findings. J Am Coll Radiol 2013; 10:789-794. Aortic aneurysm NOS (ICD10-I71.9)  3. 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  4. Cirrhotic changes involving the liver. No worrisome hepatic lesions.  5. Emphysematous changes and pulmonary scarring but no acute pulmonary findings.  6. 17 mm right thyroid nodule. Recommend thyroid US and potential biopsy (ref: J Am Coll Radiol. 2015 Feb;12(2): 143-50).  7. Aortic Atherosclerosis (ICD10-I70.0) and Emphysema (ICD10-J43.9).   DG  CHEST PORT 1 VIEW 11/20/2019 pending   11/22/2019 Stable hazy opacities in both lungs likely reflecting a combination of atelectasis with layering left effusion. Lines and tubes. 11/21/2019 Persistent basilar opacities, favor atelectasis though more confluent opacity in the retrocardiac space could reflect airspace disease. 11/18/2019 1. Unchanged retrocardiac opacity with volume loss, may be atelectasis, pneumonia or aspiration. Perihilar atelectasis on the right. 2. Stable support apparatus allowing for patient rotation.  11/17/2019 Retrocardiac opacity with volume loss suggesting atelectasis. Given the history, superimposed infection is not excluded. Unremarkable hardware 12-01-2019 No active disease.  Aortic atherosclerosis, tortuosity and ectasia.  12/01/19 1.  Lungs clear.  2. Prominence of the descending aorta with questionable degree of aneurysmal dilatation distally.   ECHOCARDIOGRAM COMPLETE Dec 01, 2019 1. Left ventricular ejection fraction, by estimation, is 60 to 65%. The left ventricle has normal function. The left ventricle has no regional wall motion abnormalities. Indeterminate diastolic filling due to E-A fusion.   2. Right ventricular systolic function is normal. The right ventricular size is normal. Tricuspid regurgitation signal is inadequate for assessing PA pressure.   3. The mitral valve is normal in structure and function. No evidence of mitral valve regurgitation. No evidence of mitral stenosis.   4. The aortic valve is tricuspid. Aortic valve regurgitation is not visualized. Mild aortic valve sclerosis is present, with no evidence of aortic valve stenosis.   5. The inferior vena cava is normal in size with greater than 50% respiratory variability, suggesting right atrial pressure of 3 mmHg.   6. Technically difficult study with poor acoustic windows.   EEG adult 11/18/2019 This technically difficult study showed evidence of epileptogenicity in left anterior temporal  region as well as moderate to severe diffuse encephalopathy, non specific to etiology. No seizures  were seen throughout the recording. If concern for ictal and interictal activity persists, please consider repeat prolonged study.   ECG - ST rate 109 BPM. (See cardiology reading for complete details)  PHYSICAL EXAM   Temp:  [98.2 F (36.8 C)-100.1 F (37.8 C)] 98.7 F (37.1 C) (02/28 0400) Pulse Rate:  [58-126] 68 (02/28 0700) Resp:  [20-32] 27 (02/28 0700) BP: (91-154)/(53-91) 97/68 (02/28 0700) SpO2:  [89 %-99 %] 92 % (02/28 0700) FiO2 (%):  [40 %] 40 % (02/28 0327)  Exam no changes General - Well nourished, well developed, intubated  Ophthalmologic - fundi not visualized due to noncooperation.  Cardiovascular - Regular rate and rhythm without stimulation  Neuro - intubated, eyes open on voice, barely following limited simple commands on the right hand (showing thumb) and right foot (weakly  wiggled toes). Moving right side purposefully, With eye opening, eyes right gaze preference, not cross midline, no nystagmus. Not blinking to visual threat bilaterally, PERRL. Corneal reflex present, gag and cough present. Breathing over the vent.  Facial symmetry not able to test due to ET tube.  Tongue protrusion not cooperative. RUE and RLE spontaneous movement, but not against gravity. On pain stimulation, LUE 0/5 and LLE slight withdraw. DTR 1+ and positive babinski bilaterally. Sensation, coordination and gait not tested.   ASSESSMENT/PLAN Paula Kim is a 64 y.o. female with history of HLD found down presenting with L sided weakness and dysarthria.  Stroke:   Large R MCA infarct w/ petechial hemorrhage R parietal s/p decompressive hemicrani - source unclear, ? large vessel vasculopathy  CT head extensive hypodensity R MCA territory w/ infarct.  CTA head proximal R M1 occlusion. Posterior circulation atherosclerosis - L VA stenosis vs focal occlusion. B mid P2 PCA moderate to severe  stenoses.  CTA neck mixed plaque bifurcations and proximal B ICAs. Possible ulcerated plaque R ICA bulb. Lobar dilation B ICA bulbs. L ICA tortuous. 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   CT perfusion positive for penumbra  MRI  R MCA infarct w/ R parietal petechial hemorrhage. Chronic L parietooccipital infarcts.   CT repeat 2/21 - stable 62mm MLS  CT repeat 2/23 - slight interval increase in brain swelling through crani, interval evolution, stable 4 to 5 mm MLS  LE venous doppler - negative for DVT bilaterally  2D Echo EF 60-65%. No source of embolus   Consider 30 day cardiac event monitoring as outpt  LDL 70  HgbA1c 5.0  Heparin 5000 units sq tid for VTE prophylaxis  aspirin 325 mg daily prior to admission, now on aspirin 325 mg daily. No DAPT given potential trach/PEG next week  Therapy recommendations:  Pending. Reorder when able to evaluate  Disposition:  pending   Acute Respiratory Failure  Intubated  On weaning   Sedated with precedex and fentanyl  Klonopin added 0.25 bid  CCM onboard  Not candidate for extubation at this time  Likely require trach next week  Cerebral Edema  S/p Right decompression hemicrani by Cabbell on 11/10/2019  CT 2/21 stable 1mm MLS  CT repeat 2/23 - slight interval increase in brain swelling through crani, interval evolution, stable 4-5 mm MLS  On Keppra for seizure prevention  On 3% saline -> off -> NS @40  -> 1/2NS @40  ->NS @40   Na 140->...>152->151->160->152->151->148->pending  Seizure like activity  Stimulation induced increased muscle tone at LUE and tonic posturing of RUE and RLE.   EEG left frontal sharp waves  On keppra 500 -> increase to 1000 bid  Repeat EEG 2/25 L anterior temporal lobe sharp waves  Continue keppra  Fever  Possible evolving LLL PNA  Tmax 101.2->100.6->101->100.5->100.1->98.7  On ice pack  Mild Leukocytosis, -  11.1->14.8->7.6->6.6->9.9->10.0->8.7->8.5->12.8  Blood culture 2/22 no growth 5 days   UA neg   CXR stable hazy opacities, ? Retrocardiac infiltrate  Surgical wound clear dry, no pus  Resp cx GPC in clusters, GPR  On rocephin 2/26>>2/26->off  On cefepime  2/26>>off  On vanc 2/26>>off  Now on Ancef started 2/27  Acute blood loss anemia, critical illness   Hb - 13->7.6->8.2->7.1->6.9->1u PRBC-> 8.3->8.1->7.9->7.8->7.4->7.0->7.4   Stool guaiac - negative x 1  Close monitoring CBC  Iron panel Fer ok, Fe 27, TIBC 172   On iron supplement  CCM on board  Hypotension  Off Levophed  Stable on the lower end . SBP goal 110-180 . Long-term BP goal normotensive  Hyperlipidemia  Home meds:  pravachol 40, fish oil, fenofibrate  LDL 70, goal < 70  On Crestor 20  Continue statin on discharge  Dysphagia At risk malnutrition . Secondary to stroke . NPO . on TF @ 55 . IVF NS @ 40 . 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 F/u in 6 mos. 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   Repeat in 6 months  Other Stroke Risk Factors  Former Cigarette smoker, quit 13 yrs ago  Substance abuse - UDS:  THC POSITIVE, Will advise to stop using due to stroke risk.  Hx stroke/TIA  06/2009 - midbrain and L PCA territory infarcts. Small vessel disease.    Other Active Problems  Hx skew deviation since childhood, worsened post stroke  Hypokalemia 2.7-3.6->3.4->3.8->2.8->3.3->3.1->3.3->2.8 supplement->3.1 supplement and recheck->3.8  R thyroid nodule. Nonemergent [place] recommended  Depression on Effexor  Hospital day # 10 This patient is critically ill and at significant risk of neurological worsening, death and care requires constant monitoring of vital signs,  hemodynamics,respiratory and cardiac monitoring,review of multiple databases, neurological assessment, discussion with family, other specialists and medical decision making of high complexity.I  I spent 30 minutes of neurocritical care time in the care of this patient.    To contact Stroke Continuity provider, please refer to [email or web address]. After hours, contact General Neurology

## 2019-11-24 NOTE — Progress Notes (Signed)
eLink Physician-Brief Progress Note Patient Name: Paula Kim DOB: [birth year] MRN: [id number]   Date of Service  11/24/2019  HPI/Events of Note  Pt with constipation.  eICU Interventions  PRN Colace and Dulcolax ordered.        Paula Kim Paula Kim 11/24/2019, 12:53 AM

## 2019-11-24 NOTE — Progress Notes (Signed)
Neurosurgery Service Progress Note  Subjective: No acute events overnight   Objective: Vitals:   11/24/19 0500 11/24/19 0600 11/24/19 0700 11/24/19 0803  BP: (!) 94/56 116/69 97/68 (!) 86/55  Pulse: 76 75 68 70  Resp: (!) 22 (!) 28 (!) 27 20  Temp:      TempSrc:      SpO2: 90% 92% 92% 96%  Weight:      Height:       Temp (24hrs), Avg:98.8 F (37.1 C), Min:98.2 F (36.8 C), Max:99.1 F (37.3 C)  CBC Latest Ref Rng & Units 11/24/2019 11/23/2019 11/22/2019  WBC 4.0 - 10.5 K/uL 12.8(H) 8.5 8.9  Hemoglobin 12.0 - 15.0 g/dL 7.4(L) 7.0(L) 7.4(L)  Hematocrit 36.0 - 46.0 % 24.7(L) 23.2(L) 23.8(L)  Platelets 150 - 400 K/uL 266 221 200   BMP Latest Ref Rng & Units 11/24/2019 11/23/2019 11/22/2019  Glucose 70 - 99 mg/dL 122(H) 141(H) 176(H)  BUN 8 - 23 mg/dL 19 19 21   Creatinine 0.44 - 1.00 mg/dL 0.54 0.32(L) 0.55  Sodium 135 - 145 mmol/L 145 148(H) 151(H)  Potassium 3.5 - 5.1 mmol/L 3.8 3.1(L) 2.8(L)  Chloride 98 - 111 mmol/L 115(H) 119(H) 119(H)  CO2 22 - 32 mmol/L 21(L) 22 22  Calcium 8.9 - 10.3 mg/dL 7.8(L) 7.5(L) 7.6(L)    Intake/Output Summary (Last 24 hours) at 11/24/2019 0839 Last data filed at 11/24/2019 0700 Gross per 24 hour  Intake 1736.62 ml  Output 1150 ml  Net 586.62 ml    Current Facility-Administered Medications:  .  acetaminophen (TYLENOL) tablet 650 mg, 650 mg, Oral, Q4H PRN **OR** acetaminophen (TYLENOL) 160 MG/5ML solution 650 mg, 650 mg, Per Tube, Q4H PRN, 650 mg at 11/23/19 1314 **OR** acetaminophen (TYLENOL) suppository 650 mg, 650 mg, Rectal, Q4H PRN, Greta Doom, MD .  aspirin EC tablet 325 mg, 325 mg, Oral, Daily, 325 mg at 11/21/19 0909 **OR** aspirin suppository 300 mg, 300 mg, Rectal, Daily, Rosalin Hawking, MD, 300 mg at 11/23/19 0944 .  bisacodyl (DULCOLAX) EC tablet 5 mg, 5 mg, Oral, Daily PRN, Ogan, Okoronkwo U, MD .  ceFAZolin (ANCEF) IVPB 2g/100 mL premix, 2 g, Intravenous, Q8H, Priscella Mann, RPH, Stopped at 11/24/19 0036 .   chlorhexidine gluconate (MEDLINE KIT) (PERIDEX) 0.12 % solution 15 mL, 15 mL, Mouth Rinse, BID, Greta Doom, MD, 15 mL at 11/24/19 3383 .  Chlorhexidine Gluconate Cloth 2 % PADS 6 each, 6 each, Topical, Daily, Collene Gobble, MD, 6 each at 11/24/19 0200 .  clonazepam (KLONOPIN) disintegrating tablet 0.25 mg, 0.25 mg, Per Tube, BID, Collene Gobble, MD, 0.25 mg at 11/23/19 2135 .  dexmedetomidine (PRECEDEX) 400 MCG/100ML (4 mcg/mL) infusion, 0.4-1.8 mcg/kg/hr, Intravenous, Titrated, Byrum, Rose Fillers, MD, Last Rate: 15.08 mL/hr at 11/24/19 0700, 0.9 mcg/kg/hr at 11/24/19 0700 .  docusate (COLACE) 50 MG/5ML liquid 100 mg, 100 mg, Oral, BID PRN, Ogan, Okoronkwo U, MD .  famotidine (PEPCID) 40 MG/5ML suspension 20 mg, 20 mg, Per Tube, BID, Garvin Fila, MD, 20 mg at 11/23/19 2136 .  feeding supplement (PRO-STAT SUGAR FREE 64) liquid 30 mL, 30 mL, Per Tube, Daily, Rosalin Hawking, MD, 30 mL at 11/23/19 0944 .  feeding supplement (VITAL AF 1.2 CAL) liquid 1,000 mL, 1,000 mL, Per Tube, Continuous, Rosalin Hawking, MD, Last Rate: 55 mL/hr at 11/23/19 0621, 1,000 mL at 11/23/19 0621 .  fentaNYL (SUBLIMAZE) injection 50 mcg, 50 mcg, Intravenous, Q2H PRN, Sampson Goon, MD, 50 mcg at 11/19/19 1408 .  fentaNYL 2564mg  in NS 272m (153m/ml) infusion-PREMIX, 50-200 mcg/hr, Intravenous, Continuous, [place], Emily T, MD, Last Rate: 5 mL/hr at 11/24/19 0700, 50 mcg/hr at 11/24/19 0700 .  ferrous sulfate 300 (60 Fe) MG/5ML syrup 300 mg, 300 mg, Per Tube, BID WC, EgSkeet SimmerRPH, 300 mg at 11/23/19 1653 .  free water 200 mL, 200 mL, Per Tube, Q6H, DaMerlene Laughter, NP, 200 mL at 11/24/19 0004 .  heparin injection 5,000 Units, 5,000 Units, Subcutaneous, Q8H, CaAshok PallMD, Stopped at 11/24/19 [phone]  insulin aspart (novoLOG) injection 0-24 Units, 0-24 Units, Subcutaneous, Q4H, Ogan, Okoronkwo U, MD, 2 Units at 11/24/19 0000 .  labetalol (NORMODYNE) injection 10-20 mg, 10-20 mg, Intravenous, Q10 min PRN,  XuRosalin HawkingMD .  levETIRAcetam (KEPPRA) 100 MG/ML solution 1,000 mg, 1,000 mg, Per Tube, BID, XuRosalin HawkingMD, 1,000 mg at 11/23/19 2135 .  MEDLINE mouth rinse, 15 mL, Mouth Rinse, 10 times per day, KiGreta DoomMD, 15 mL at 11/24/19 0410 .  midazolam (VERSED) injection 1 mg, 1 mg, Intravenous, Q2H PRN, AvJudd LienMD, 1 mg at 11/22/19 1955 .  naloxone (NP[facility]oscopy Center LLCinjection 0.08 mg, 0.08 mg, Intravenous, PRN, CaAshok PallMD .  ondansetron (ZOFRAN) tablet 4 mg, 4 mg, Oral, Q4H PRN **OR** ondansetron (ZOFRAN) injection 4 mg, 4 mg, Intravenous, Q4H PRN, CaAshok PallMD .  rosuvastatin (CRESTOR) tablet 20 mg, 20 mg, Per Tube, q1800, SeGarvin FilaMD, 20 mg at 11/23/19 1828 .  senna-docusate (Senokot-S) tablet 1 tablet, 1 tablet, Per Tube, QHJackelyn PolingMD, 1 tablet at 11/23/19 2135 .  sodium chloride flush (NS) 0.9 % injection 10-40 mL, 10-40 mL, Intracatheter, Q12H, SeGarvin FilaMD, 10 mL at 11/23/19 2340 .  sodium chloride flush (NS) 0.9 % injection 10-40 mL, 10-40 mL, Intracatheter, PRN, SeGarvin FilaMD .  venlafaxine (EV[facility] D[facility] Asc[facility] Christ[facility] Surgery Centertablet 75 mg, 75 mg, Per Tube, BID, SeGarvin FilaMD, 75 mg at 11/23/19 2338   Physical Exam: R trauma flap incision c/d/i, flap soft Eyes open to voice, follows axial commands and simple RUE/RLE commands  Assessment & Plan: [age].o. woman w/ R MCA infarct s/p hemicraniectomy.  -no change in neurosurgical plan of care  ThJudith Part02/28/21 8:39 AM

## 2019-11-24 NOTE — Progress Notes (Addendum)
I spoke with niece, Marylene Land. Family has planned to do a one way extubation tomorrow afternoon. If the patient can tolerate breathing on her own they will continue with aggressive care. In the case the patient is not able to maintain airway they will then go the comfort route with no PEG or Trach placement.    The three people who plan to come tomorrow at 1100 are:  [facility]  Sharee Pimple was made aware that should the patient maintain her airways and continue to progress, visitation will revert back to one person.

## 2019-11-25 ENCOUNTER — Encounter (HOSPITAL_COMMUNITY): Payer: Self-pay | Admitting: Anesthesiology

## 2019-11-25 LAB — BASIC METABOLIC PANEL
Anion gap: 8 (ref 5–15)
BUN: 16 mg/dL (ref 8–23)
CO2: 22 mmol/L (ref 22–32)
Calcium: 7.6 mg/dL — ABNORMAL LOW (ref 8.9–10.3)
Chloride: 112 mmol/L — ABNORMAL HIGH (ref 98–111)
Creatinine, Ser: 0.44 mg/dL (ref 0.44–1.00)
GFR calc Af Amer: >60 mL/min (ref >60)
GFR calc non Af Amer: >60 mL/min (ref >60)
Glucose, Bld: 145 mg/dL — ABNORMAL HIGH (ref 70–99)
Potassium: 3.3 mmol/L — ABNORMAL LOW (ref 3.5–5.1)
Sodium: 142 mmol/L (ref 135–145)

## 2019-11-25 LAB — CBC
HCT: 23.4 % — ABNORMAL LOW (ref 36.0–46.0)
Hemoglobin: 7.1 g/dL — ABNORMAL LOW (ref 12.0–15.0)
MCH: 30 pg (ref 26.0–34.0)
MCHC: 30.3 g/dL (ref 30.0–36.0)
MCV: 98.7 fL (ref 80.0–100.0)
Platelets: 258 10*3/uL (ref 150–400)
RBC: 2.37 MIL/uL — ABNORMAL LOW (ref 3.87–5.11)
RDW: 14.4 % (ref 11.5–15.5)
WBC: 11.5 10*3/uL — ABNORMAL HIGH (ref 4.0–10.5)
nRBC: 0.4 % — ABNORMAL HIGH (ref 0.0–0.2)

## 2019-11-25 LAB — GLUCOSE, CAPILLARY
Glucose-Capillary: 163 mg/dL — ABNORMAL HIGH (ref 70–99)
Glucose-Capillary: 125 mg/dL — ABNORMAL HIGH (ref 70–99)
Glucose-Capillary: 127 mg/dL — ABNORMAL HIGH (ref 70–99)

## 2019-11-25 MED ORDER — CHLORHEXIDINE GLUCONATE 0.12 % MT SOLN
OROMUCOSAL | Status: AC
  Administered 2019-11-25: 15 mL via OROMUCOSAL
  Filled 2019-11-25: qty 15

## 2019-11-25 MED ORDER — GLYCOPYRROLATE 0.2 MG/ML IJ SOLN
0.4000 mg | Freq: Four times a day (QID) | INTRAMUSCULAR | Status: DC | PRN
  Administered 2019-11-25: 23:00:00 0.4 mg via INTRAVENOUS
  Filled 2019-11-25 (×2): qty 2

## 2019-11-25 MED ORDER — MORPHINE SULFATE (PF) 2 MG/ML IV SOLN
2.0000 mg | INTRAVENOUS | Status: DC | PRN
  Administered 2019-11-25: 2 mg via INTRAVENOUS
  Filled 2019-11-25: qty 1

## 2019-11-25 MED ORDER — POTASSIUM CHLORIDE 20 MEQ/15ML (10%) PO SOLN
40.0000 meq | Freq: Once | ORAL | Status: AC
  Administered 2019-11-25: 40 meq
  Filled 2019-11-25: qty 30

## 2019-11-25 MED ORDER — POTASSIUM CHLORIDE 20 MEQ/15ML (10%) PO SOLN
20.0000 meq | Freq: Once | ORAL | Status: AC
  Administered 2019-11-25: 20 meq
  Filled 2019-11-25: qty 15

## 2019-11-25 MED ORDER — FUROSEMIDE 10 MG/ML IJ SOLN
20.0000 mg | Freq: Once | INTRAMUSCULAR | Status: AC
  Administered 2019-11-25: 20 mg via INTRAVENOUS
  Filled 2019-11-25: qty 2

## 2019-11-25 MED ORDER — POLYVINYL ALCOHOL 1.4 % OP SOLN
1.0000 [drp] | Freq: Four times a day (QID) | OPHTHALMIC | Status: DC | PRN
  Filled 2019-11-25: qty 15

## 2019-11-25 MED ORDER — GLYCOPYRROLATE 1 MG PO TABS: 1.0000 mg | ORAL_TABLET | ORAL | Status: DC | PRN

## 2019-11-25 MED ORDER — LORAZEPAM 2 MG/ML IJ SOLN
2.0000 mg | INTRAMUSCULAR | Status: DC | PRN
  Administered 2019-11-25: 4 mg via INTRAVENOUS
  Filled 2019-11-25: qty 2

## 2019-11-25 MED ORDER — SCOPOLAMINE 1 MG/3DAYS TD PT72
1.0000 | MEDICATED_PATCH | TRANSDERMAL | Status: DC
  Administered 2019-11-25: 1.5 mg via TRANSDERMAL
  Filled 2019-11-25: qty 1

## 2019-11-25 MED ORDER — MORPHINE BOLUS VIA INFUSION
5.0000 mg | INTRAVENOUS | Status: DC | PRN
  Filled 2019-11-25: qty 5

## 2019-11-25 MED ORDER — DIPHENHYDRAMINE HCL 50 MG/ML IJ SOLN: 25.0000 mg | INTRAMUSCULAR | Status: DC | PRN

## 2019-11-25 MED ORDER — GLYCOPYRROLATE 0.2 MG/ML IJ SOLN: 0.2000 mg | INTRAMUSCULAR | Status: DC | PRN

## 2019-11-25 MED ORDER — MORPHINE 100MG IN NS 100ML (1MG/ML) PREMIX INFUSION
0.0000 mg/h | INTRAVENOUS | Status: DC
  Administered 2019-11-25: 20:00:00 20 mg/h via INTRAVENOUS
  Administered 2019-11-25: 10 mg/h via INTRAVENOUS
  Administered 2019-11-26 (×3): 20 mg/h via INTRAVENOUS
  Filled 2019-11-25 (×6): qty 100

## 2019-11-25 MED ORDER — GLYCOPYRROLATE 0.2 MG/ML IJ SOLN
0.2000 mg | INTRAMUSCULAR | Status: DC | PRN
  Administered 2019-11-25: 0.2 mg via INTRAVENOUS
  Filled 2019-11-25: qty 1

## 2019-11-25 NOTE — Addendum Note (Signed)
Addendum  created 11/25/19 1112 by Eilene Ghazi, MD   Intraprocedure Staff edited

## 2019-11-25 NOTE — Procedures (Signed)
Extubation Procedure Note  Patient Details:   Name: Paula Kim DOB: 14-Feb-1956 MRN: [id number]   Airway Documentation:    Vent end date: 11/25/19 Vent end time: 1254   Evaluation  O2 sats: stable throughout Complications: No apparent complications Patient did tolerate procedure well. Bilateral Breath Sounds: Diminished   Yes   Patient extubated per MD order. RN at bedside. Patient had strong cough after extubation. Vitals are stable on 4L [place].  Harmon Dun Kacia Halley 11/25/2019, 12:57 PM

## 2019-11-25 NOTE — Progress Notes (Signed)
The chaplain responded to a referral from the nurse. The chaplain offered a prayer for the patient as they are at the end of life. The chaplain also offered support to the family if needed. The chaplain does not need to follow-up.  Lavone Neri Chaplain Resident For questions concerning this note please contact me by pager [phone]

## 2019-11-25 NOTE — Progress Notes (Signed)
PT Cancellation Note  Patient Details Name: Paula Kim MRN: [id number] DOB: May 26, 1956   Cancelled Treatment:    Reason Eval/Treat Not Completed: Other (comment)(Moving to comfort, terminal wean pending)  Will sign off at this time.   11/25/2019  Jacinto Halim., PT Acute Rehabilitation Services [phone]  (pager) [phone]  (office) [name] [name] 11/25/2019, 12:12 PM

## 2019-11-25 NOTE — Progress Notes (Signed)
eLink Physician-Brief Progress Note Patient Name: Paula Kim DOB: 01-Feb-1956 MRN: [id number]   Date of Service  11/25/2019  HPI/Events of Note  Notified of K 3.3 Creatinine 0.44  eICU Interventions  Ordered K 40 meqs per tube x 1      Intervention Category Major Interventions: Electrolyte abnormality - evaluation and management  Darl Pikes 11/25/2019, 6:42 AM

## 2019-11-25 NOTE — Progress Notes (Signed)
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  On imaging she already had a large right MCA territory infarct.  She underwent a decompressive craniectomy on the evening of 2/18.   History of present illness   Unfortunately we have very little history on this 64 year old.  A friend tried to call her on the morning of admission, as she usually does at 6 AM, and when she was unable to awaken the patient went to visit and found her unresponsive.  She was brought to our department of emergency medicine where a CT and CTA were performed.  She has a large right MCA infarct and an M1 occlusion.  She was not a candidate for either thrombolytics or thrombectomy and she was taken to the operating room for craniectomy on the evening of 2/18.    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 Past surgical history is remarkable for a laparoscopic cholecystectomy.  Significant Hospital Events   Admission 12-08-2019.  Decompressive craniectomy 2/18  Consults:  Neurosurgery Neurology  Procedures:  Decompressive craniectomy 2/18  Significant Diagnostic Tests:  CT and CTA head 2/19 >> showing a large right MCA infarct and M1 occlusion  MRI brain 2/18 >> 1. Motion degraded exam.  2. Acute ischemic infarction involving much of the right MCA vascular territory. Only mild mass effect at this time. Mild petechial hemorrhage within portions of the right parietal infarction territory.  3. Redemonstrated small chronic cortically based infarcts within the left parietooccipital lobes.  4. Moderate/advanced chronic small vessel ischemic disease with multiple  chronic lacunar infarcts, progressed from MRI 07/08/2009.  5. Mild generalized parenchymal atrophy.  6. Air-fluid level and frothy secretions within the left maxillary sinus. Correlate for acute sinusitis.  TTE 2/18 >> 1. Left ventricular ejection fraction, by estimation, is 60 to 65%. The left ventricle has normal function. The left ventricle has no regional wall motion abnormalities. Indeterminate diastolic filling due to E-A fusion.   2. Right ventricular systolic function is normal. The right ventricular size is normal. Tricuspid regurgitation signal is inadequate for assessing PA pressure.   3. The mitral valve is normal in structure and function. No evidence of mitral valve regurgitation. No evidence of mitral stenosis.   4. The aortic valve is tricuspid. Aortic valve regurgitation is not visualized. Mild aortic valve sclerosis is present, with no evidence of aortic valve stenosis.   5. The inferior vena cava is normal in size with greater than 50% respiratory variability, suggesting right atrial pressure of 3 mmHg.   6. Technically difficult study with poor acoustic windows.   CT head 2/21 >> 1. Stable no new intracranial abnormality.  EEG 2/22 >> left anterior temporal sharp waves with epileptogenicity and no overt seizures  CT head 2/23 >> continued interval evolution of large right MCA territory infarct without hemorrhage, slight increase in parenchymal edema through the craniectomy defect, similar 4 to 5 mm right to left midline shift  EEG 2/25 >>  Left anterior temporal epileptogenicity without overt seizures  Chest x-ray 2/27 >> Persistent left lower lobe consolidation and left effusion unchanged. Support lines remain in good position.  Micro Data:  COVID 2/18 >  Negative MRSA PCR 2/18 > Negative Blood 2/22 > negative Urine 2/22 > appears uncollected Respiratory 2/24 >> few Raoultella planticola and staphylococcal  >> resistant ampicillin  Antimicrobials:  Ceftriaxone 2/25  >> 2/26 Vancomycin 2/26 >> 2/27 Cefepime 2/26 >> 2/27 Cefazolin 2/27 >>   Interim history/subjective:  PSV yesterday for 12 hours on 10-8/5 No changes overnight TF on hold  Remains on precedex 0.9 mcg/kg/min for intermittent agitation Family coming in today at 11 am for one way extubation  Objective   Blood pressure 120/83, pulse (!) 59, temperature 98.7 F (37.1 C), temperature source Axillary, resp. rate (!) 29, height 5\' 1"  (1.549 m), weight 72 kg, SpO2 97 %.    Vent Mode: PRVC FiO2 (%):  [40 %] 40 % Set Rate:  [16 bmp] 16 bmp Vt Set:  [380 mL] 380 mL PEEP:  [5 cmH20] 5 cmH20 Pressure Support:  [8 cmH20-10 cmH20] 8 cmH20   Intake/Output Summary (Last 24 hours) at 11/25/2019 01/25/2020 Last data filed at 11/25/2019 0700 Gross per 24 hour  Intake 2397.98 ml  Output 900 ml  Net 1497.98 ml   Filed Weights   11/20/19 0500 11/23/19 0500 11/25/19 0500  Weight: 68.6 kg 72 kg 72 kg    Examination: General:  Ill appearing older female on MV in NAD HEENT: MM pink/moist, ETT, OGT, increased oral secretions, pupils 5/reactive, right crani flap soft/ staples intact  Neuro: attempts to open eyes to voice and attempts to follow simple commands on RUE/ RLE Withdrawals on left  CV: rr, no murmur PULM:  tachypneic over MV at baseline, clear but diminished, flipped to PSV 10/5 and not getting TVs and more tachypneic in 40's, minimal ETT secretions, strong cough GI:  Obese, soft, bs active, purwick  Extremities: warm/dry, generalized edema/ dependent greater in upper extremtities Skin: no rashes   Resolved problems:  Circulatory Shock, resolved - suspect from sedation, possibly some vasoplegia.   Assessment & Plan:  This is a 64 year old who is suffered from a large right MCA territory infarct and underwent decompressive craniotomy on 11/05/2019.  Acute Hypoxemic Respiratory Failure -Due to large right MCA infarct resulting in coma Staph and Raoultella pneumonia.   P: One way extubation  planned for today at 1100 with family present Mental status has been a barrier to extubation prior to.  I am concerned she will not tolerate extubation well given oral and previous tracheal secretions in addition to her very slow to improve mental status.  If she fails, plan is to transition to comfort care as family have expressed she would not want a tracheostomy or prolonged measures.  Lasix 20 mg x 1 and KCL 20 meq as she is net +13 L VAP bundle  Ongoing SBT efforts  PAD protocol with precedex, prn fentanyl   Large R MCA infarct w/ petechial hemorrhage R parietal  Cerebral edema  Epileptiform activity on EEG 2/22 without overt seizures. -s/p decompressive hemicrani 11/17/2019 -Question whether she may be having intermittent seizure activity P: NSGY and neurology following  Keppra per Neurology  Seizure precautions  Serial neuro exams  Hypokalemia P: S/p KCL overnight, additional KCL 20 meq with lasix Trend BMET  Anemia of critical disease -no clear source for acute blood loss.   P: H/H stable  Trend CBC/ transfuse for > 7 Monitor for bleeding   Acute metabolic encephalopathy.  -Suspect multifactorial from delirium, sedating medications and acute stroke. Depression P: Remains on precedex for ongoing intermittent agitation, mainly on right Prn fentanyl Ongoing low-dose  clonazepam and venalfaxine  At risk malnutrition due to inadequate oral intake P: TF on hold given anticipated one-way extubation today  If tolerates well, anticipate she will need cortrak placement pending SLP   Goals of care Family planning for one way extubation today at 11am as family express patient would not want prolonged life support including tracheostomy   Best practice:  Diet: tube feeds currently on hold  Pain/Anxiety/Delirium protocol (if indicated): precedex and prn fentanyl  VAP protocol (if indicated): In place DVT prophylaxis: SCDs GI prophylaxis: Pepcid Glucose control:  SSI Mobility: Bed rest Code Status: Full Family Communication: pending Disposition: maintain ICU  Labs   CBC: Recent Labs  Lab 11/19/19 1815 11/20/19 0238 11/21/19 0519 11/22/19 0422 11/23/19 0621 11/24/19 0545 11/25/19 0515  WBC 9.9   < > 8.7 8.9 8.5 12.8* 11.5*  NEUTROABS 7.0  --   --   --   --   --   --   HGB 8.1*   < > 7.8* 7.4* 7.0* 7.4* 7.1*  HCT 25.6*   < > 24.7* 23.8* 23.2* 24.7* 23.4*  MCV 96.2   < > 98.0 97.9 100.0 100.4* 98.7  PLT 196   < > 188 200 221 266 258   < > = values in this interval not displayed.    Basic Metabolic Panel: Recent Labs  Lab 11/21/19 0519 11/22/19 0422 11/23/19 0621 11/24/19 0545 11/25/19 0515  NA 152* 151* 148* 145 142  K 3.3* 2.8* 3.1* 3.8 3.3*  CL 121* 119* 119* 115* 112*  CO2 23 22 22  21* 22  GLUCOSE 144* 176* 141* 122* 145*  BUN 22 21 19 19 16   CREATININE 0.80 0.55 0.32* 0.54 0.44  CALCIUM 8.0* 7.6* 7.5* 7.8* 7.6*  MG 2.3 2.1  --   --   --    GFR: Estimated Creatinine Clearance: 65.3 mL/min (by C-G formula based on SCr of 0.44 mg/dL). Recent Labs  Lab 11/22/19 0422 11/23/19 0621 11/24/19 0545 11/25/19 0515  WBC 8.9 8.5 12.8* 11.5*    Liver Function Tests: No results for input(s): AST, ALT, ALKPHOS, BILITOT, PROT, ALBUMIN in the last 168 hours. No results for input(s): LIPASE, AMYLASE in the last 168 hours. No results for input(s): AMMONIA in the last 168 hours.  ABG    Component Value Date/Time   PHART 7.374 11/01/2019 2112   PCO2ART 41.0 11/08/2019 2112   PO2ART 114.0 (H) 11/07/2019 2112   HCO3 23.9 10/29/2019 2112   TCO2 25 10/31/2019 2112   ACIDBASEDEF 1.0 11/11/2019 2112   O2SAT 98.0 11/09/2019 2112     Coagulation Profile: No results for input(s): INR, PROTIME in the last 168 hours.  Cardiac Enzymes: No results for input(s): CKTOTAL, CKMB, CKMBINDEX, TROPONINI in the last 168 hours.  HbA1C: Hgb A1c MFr Bld  Date/Time Value Ref Range Status  11/24/2019 05:45 AM 5.0 4.8 - 5.6 % Final     Comment:    (NOTE) Pre diabetes:          5.7%-6.4% Diabetes:              >6.4% Glycemic control for   <7.0% adults with diabetes   11/15/2019 03:45 AM 5.0 4.8 - 5.6 % Final    Comment:    (NOTE) Pre diabetes:          5.7%-6.4% Diabetes:              >6.4% Glycemic control for   <7.0% adults with diabetes     CBG:  Recent Labs  Lab 11/24/19 1113 11/24/19 1540 11/24/19 1935 11/24/19 2318 11/25/19 0324  GLUCAP 145* 125* 151* 120* 163*   CRITICAL CARE Performed by: Kennieth Rad  Total critical care time: 34 mins  Critical care time was exclusive of separately billable procedures and treating other patients.  Critical care was necessary to treat or prevent imminent or life-threatening deterioration.  Critical care was time spent personally by me on the following activities: development of treatment plan with patient and/or surrogate as well as nursing, discussions with consultants, evaluation of patient's response to treatment, examination of patient, obtaining history from patient or surrogate, ordering and performing treatments and interventions, ordering and review of laboratory studies, ordering and review of radiographic studies, pulse oximetry and re-evaluation of patient's condition.  Kennieth Rad, MSN, AGACNP-BC [facility] 11/25/2019, 7:34 AM

## 2019-11-25 NOTE — Progress Notes (Signed)
PCCM Update  Family including Marylene Land, niece, her daughter, and additional family member at bedside ready to transition comfort care.    Patient is currently following commands on right on precedex.  Appears she is trying to communicate with family.  Options discussed with family on attempting to allow patient to communicate with family vs starting morphine gtt and prn sedation as patient will likely have much difficulty once extubated given failed SBT, oral secretions and the severity of her stroke. Family wish to proceed with extubation on precedex and if/ when patient starts to have pain/ anxiety/ SOB, will transition to morphine and ativan, in addition to further CCM withdrawal orders.  Ongoing support provided.       Posey Boyer, MSN, AGACNP-BC [facility] 11/25/2019, 12:13 PM

## 2019-11-25 NOTE — Progress Notes (Signed)
Family at bedside state patient in respiratory distress. Patient is struggling to breath, elevated heart rate and respirations with decreased 02 sat. 2mg  morphine pushed IV. No change noted. Morphine gtt started with family's permission. , NP aware.

## 2019-11-25 NOTE — Progress Notes (Signed)
STROKE TEAM PROGRESS NOTE   INTERVAL HISTORY Patient remains intubated and on ventilatory support for respiratory failure.  She appears to be in discomfort and is struggling to breathe and is tachypneic and tachycardic.  Family does not want tracheostomy or prolonged ventilatory support and has agreed to DNR and one-way extubation and is leaning towards likely comfort care soon  Vitals:   11/25/19 0500 11/25/19 0600 11/25/19 0700 11/25/19 0800  BP: 119/67 116/67 120/83 123/60  Pulse: (!) 59 (!) 59 (!) 59 65  Resp: (!) 30 (!) 29 (!) 29 (!) 30  Temp:    99.1 F (37.3 C)  TempSrc:    Axillary  SpO2: 97% 98% 97% 96%  Weight: 72 kg     Height:        CBC:  Recent Labs  Lab 11/19/19 1815 11/20/19 0238 11/24/19 0545 11/25/19 0515  WBC 9.9   < > 12.8* 11.5*  NEUTROABS 7.0  --   --   --   HGB 8.1*   < > 7.4* 7.1*  HCT 25.6*   < > 24.7* 23.4*  MCV 96.2   < > 100.4* 98.7  PLT 196   < > 266 258   < > = values in this interval not displayed.    Basic Metabolic Panel:  Recent Labs  Lab 11/21/19 0519 11/21/19 0519 11/22/19 0422 11/23/19 0621 11/24/19 0545 11/25/19 0515  NA 152*   < > 151*   < > 145 142  K 3.3*   < > 2.8*   < > 3.8 3.3*  CL 121*   < > 119*   < > 115* 112*  CO2 23   < > 22   < > 21* 22  GLUCOSE 144*   < > 176*   < > 122* 145*  BUN 22   < > 21   < > 19 16  CREATININE 0.80   < > 0.55   < > 0.54 0.44  CALCIUM 8.0*   < > 7.6*   < > 7.8* 7.6*  MG 2.3  --  2.1  --   --   --    < > = values in this interval not displayed.   Lipid Panel:     Component Value Date/Time   CHOL 125 11/15/2019 0345   TRIG 151 (H) 11/15/2019 0345   HDL 25 (L) 11/15/2019 0345   CHOLHDL 5.0 11/15/2019 0345   VLDL 30 11/15/2019 0345   LDLCALC 70 11/15/2019 0345   HgbA1c:  Lab Results  Component Value Date   HGBA1C 5.0 11/24/2019   Urine Drug Screen:     Component Value Date/Time   LABOPIA NONE DETECTED 11-Dec-2019 1315   COCAINSCRNUR NONE DETECTED 12-11-2019 1315   LABBENZ NONE  DETECTED 2019-12-11 1315   AMPHETMU NONE DETECTED 11-Dec-2019 1315   THCU POSITIVE (A) 12/11/19 1315   LABBARB NONE DETECTED 12-11-2019 1315    Alcohol Level     Component Value Date/Time   ETH <10 December 11, 2019 1030    IMAGING  CT head:  December 11, 2019   1. Extensive abnormal hypodensity throughout much of the right MCA vascular territory consistent with acute/early subacute ischemic infarction. No evidence of hemorrhagic conversion. No significant mass effect at this time.  2. Background chronic ischemic changes as detailed  3. Mild generalized parenchymal atrophy.  4. Air-fluid level and frothy secretions within the left maxillary sinus. Correlate for acute sinusitis.   CT C-SPINE NO CHARGE December 11, 2019 IMPRESSION:  1. No acute fracture  or dislocation of the cervical spine.  2. Multilevel degenerative disc disease and facet arthropathy.  3. Thoracic aortic aneurysm. Please refer to forthcoming CT angiogram report for full evaluation of the vascular findings.  4. 1.8 cm right thyroid lobe nodule. Dedicated thyroid ultrasound is recommended on a nonemergent basis.  5. Centrilobular emphysema. Emphysema (ICD10-J43.9).   CTA head:  11/13/2019   1. 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 No significant reconstitution of the right M1 MCA more distally. There is minimal reconstitution of right M2 and more distal right MCA branches.  2. Posterior circulation atherosclerotic stenoses. Most notably, there is focal near occlusive stenosis versus focal occlusion of the distal left vertebral artery just proximal to the vertebrobasilar junction. Moderate to moderately severe stenoses within the mid P2 posterior cerebral arteries bilaterally.  3. Mild atherosclerotic plaque within the intracranial internal carotid arteries.   CTA neck:  10/29/2019   1. The bilateral common carotid, internal carotid and vertebral arteries are patent within the neck without significant stenosis (50% or  greater).  2. Mixed plaque within the carotid bifurcations and proximal internal carotid arteries bilaterally. There is possible ulcerated plaque within the right carotid bulb. There is lobular dilation of the bilateral carotid bulbs. Additionally, there is significant tortuosity of the proximal left ICA.  3. 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 Please correlate with findings on concurrently performed CTA chest/abdomen/pelvis.  4. 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  5. 1.8 cm nodule within the right thyroid lobe. Nonemergent dedicated thyroid ultrasound is recommended for further evaluation, as clinically appropriate.  ADDENDUM: The presence of a thoracic aortic aneurysm, lobular dilation of both carotid bulbs and 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   CT perfusion head 11/20/2019    The perfusion software identifies a 122 mL core infarct within the right MCA vascular territory. The perfusion software identifies a 139 mL region of critically hypoperfused parenchyma within the right MCA vascular territory. Reported mismatch volume 17 mL.   MR BRAIN WO CONTRAST 10/31/2019 IMPRESSION:  1. Motion degraded exam.  2. Acute ischemic infarction involving much of the right MCA vascular territory. Only mild mass effect at this time. Mild petechial hemorrhage within portions of the right parietal infarction territory.  3. Redemonstrated small chronic cortically based infarcts within the left parietooccipital lobes.  4. Moderate/advanced chronic small vessel ischemic disease with multiple chronic lacunar infarcts, progressed from MRI 07/08/2009.  5. Mild generalized parenchymal atrophy.  6. Air-fluid level and frothy secretions within the left maxillary sinus. Correlate for acute sinusitis.  CT HEAD WO CONTRAST 11/15/2019 IMPRESSION:  1. Large  right MCA infarct with no hemorrhagic transformation.  2. Status post right hemi-craniectomy with mild intracranial mass effect. 5 mm of leftward midline shift. Patent basilar cisterns.  3. Underlying chronic small vessel disease.  CT HEAD WO CONTRAST 11/17/2019 1. Stable since 11/15/2019. Large right MCA infarct with no malignant hemorrhagic transformation. Stable 5 mm of leftward midline shift. 2. No new intracranial abnormality.   CT HEAD WO CONTRAST 11/19/2019 1. Continued interval evolution of large right MCA territory infarct without evidence for hemorrhagic transformation. Slight interval increase in swelling of brain parenchyma through the craniectomy defect, but with similar 4-5 mm right-to-left midline shift. 2. No other new acute intracranial abnormality.   CT Angio Chest/Abd/Pel for Dissection W and/or W/WO 11/13/2019 1. Fusiform aneurysmal dilatation of the descending thoracic  aorta measuring up to 4.4 cm. No dissection or intramural hematoma.  2. 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 Recommend followup by abdomen and pelvis CTA in 6 months, and vascular surgery referral/consultation if not already obtained. This recommendation follows ACR consensus guidelines: White Paper of the ACR Incidental Findings Committee II on Vascular Findings. J Am Coll Radiol 2013; 10:789-794. Aortic aneurysm NOS (ICD10-I71.9)  3. 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  4. Cirrhotic changes involving the liver. No worrisome hepatic lesions.  5. Emphysematous changes and pulmonary scarring but no acute pulmonary findings.  6. 17 mm right thyroid nodule. Recommend thyroid US and potential biopsy (ref: J Am Coll Radiol. 2015 Feb;12(2): 143-50).  7. Aortic Atherosclerosis (ICD10-I70.0) and Emphysema (ICD10-J43.9).   DG CHEST PORT 1 VIEW 11/23/2019 Persistent left  lower lobe consolidation and left effusion unchanged. Support lines remain in good position. 11/22/2019 Stable hazy opacities in both lungs likely reflecting a combination of atelectasis with layering left effusion. 11/21/2019 Persistent basilar opacities, favor atelectasis though more confluent opacity in the retrocardiac space could reflect airspace disease.   11/22/2019 Stable hazy opacities in both lungs likely reflecting a combination of atelectasis with layering left effusion. Lines and tubes. 11/21/2019 Persistent basilar opacities, favor atelectasis though more confluent opacity in the retrocardiac space could reflect airspace disease. 11/18/2019 1. Unchanged retrocardiac opacity with volume loss, may be atelectasis, pneumonia or aspiration. Perihilar atelectasis on the right. 2. Stable support apparatus allowing for patient rotation.  11/17/2019 Retrocardiac opacity with volume loss suggesting atelectasis. Given the history, superimposed infection is not excluded. Unremarkable hardware 10/29/2019 No active disease.  Aortic atherosclerosis, tortuosity and ectasia.  11/06/2019 1.  Lungs clear.  2. Prominence of the descending aorta with questionable degree of aneurysmal dilatation distally.   ECHOCARDIOGRAM COMPLETE 11/23/2019 1. Left ventricular ejection fraction, by estimation, is 60 to 65%. The left ventricle has normal function. The left ventricle has no regional wall motion abnormalities. Indeterminate diastolic filling due to E-A fusion.   2. Right ventricular systolic function is normal. The right ventricular size is normal. Tricuspid regurgitation signal is inadequate for assessing PA pressure.   3. The mitral valve is normal in structure and function. No evidence of mitral valve regurgitation. No evidence of mitral stenosis.   4. The aortic valve is tricuspid. Aortic valve regurgitation is not visualized. Mild aortic valve sclerosis is present, with no evidence of aortic valve  stenosis.   5. The inferior vena cava is normal in size with greater than 50% respiratory variability, suggesting right atrial pressure of 3 mmHg.   6. Technically difficult study with poor acoustic windows.   EEG adult 11/18/2019 This technically difficult study showed evidence of epileptogenicity in left anterior temporal region as well as moderate to severe diffuse encephalopathy, non specific to etiology. No seizures  were seen throughout the recording. If concern for ictal and interictal activity persists, please consider repeat prolonged study.   ECG - ST rate 109 BPM. (See cardiology reading for complete details)   PHYSICAL EXAM    Exam no changes General - Well nourished, well developed, intubated  Ophthalmologic - fundi not visualized due to noncooperation.  Cardiovascular - Regular rate and rhythm without stimulation  Neurological Exam :  - intubated, eyes open on voice, barely following limited simple commands on the right hand and right foot . Moving right side purposefully, With eye opening, eyes right  gaze preference, not cross midline, no nystagmus. Not blinking to visual threat bilaterally, PERRL. Corneal reflex present, gag and cough present. Breathing over the vent.  Facial symmetry not able to test due to ET tube.  Tongue protrusion not cooperative. RUE and RLE spontaneous movement, but not against gravity. On pain stimulation, LUE 0/5 and LLE slight withdraw. DTR 1+ and positive babinski bilaterally. Sensation, coordination and gait not tested.   ASSESSMENT/PLAN Ms. Paula Kim is a 64 y.o. female with history of HLD found down presenting with L sided weakness and dysarthria.  Stroke:   Large R MCA infarct w/ petechial hemorrhage R parietal s/p decompressive hemicrani - source unclear, ? large vessel vasculopathy  CT head extensive hypodensity R MCA territory w/ infarct.  CTA head proximal R M1 occlusion. Posterior circulation atherosclerosis - L VA stenosis vs  focal occlusion. B mid P2 PCA moderate to severe stenoses.  CTA neck mixed plaque bifurcations and proximal B ICAs. Possible ulcerated plaque R ICA bulb. Lobar dilation B ICA bulbs. L ICA tortuous. 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   CT perfusion positive for penumbra  MRI  R MCA infarct w/ R parietal petechial hemorrhage. Chronic L parietooccipital infarcts.   CT repeat 2/21 - stable 76mm MLS  CT repeat 2/23 - slight interval increase in brain swelling through crani, interval evolution, stable 4 to 5 mm MLS  LE venous doppler - negative for DVT bilaterally  2D Echo EF 60-65%. No source of embolus   Consider 30 day cardiac event monitoring as outpt  LDL 70  HgbA1c 5.0  Heparin 5000 units sq tid for VTE prophylaxis  aspirin 325 mg daily prior to admission, now on aspirin 325 mg daily.   Disposition:  pending   Anticipate comfort care   Acute Respiratory Failure  Intubated  On weaning   Sedated with precedex and fentanyl  Klonopin added 0.25 bid  CCM onboard  Respirations labored this am   For 1-way extubation at 1100. Anticipate comfort care as family does not want trach   Cerebral Edema  S/p Right decompression hemicrani by Cabbell on 11/13/2019  CT 2/21 stable 44mm MLS  CT repeat 2/23 - slight interval increase in brain swelling through crani, interval evolution, stable 4-5 mm MLS  On Keppra for seizure prevention  On 3% saline -> off -> NS @40  -> 1/2NS @40  ->NS @40   Na 142  Seizure like activity  Stimulation induced increased muscle tone at LUE and tonic posturing of RUE and RLE.   EEG left frontal sharp waves  On keppra 500 -> increase to 1000 bid  Repeat EEG 2/25 L anterior temporal lobe sharp waves  Continue keppra  Fever  Possible evolving LLL PNA  Tmax 100  Mild Leukocytosis 11.5  Blood culture 2/22 no growth   UA neg   CXR stable hazy opacities, ? Retrocardiac  infiltrate  Surgical wound clear dry, no pus  Resp cx GPC in clusters, GPR  On rocephin 2/26>>2/26->off  On cefepime  2/26>>off  On vanc 2/26>>off  Ancef 2/27 >>  Acute blood loss anemia, critical illness   Hb - 13->...->6.9->1u PRBC->...7.1   Stool guaiac - negative x 1  Close monitoring CBC  Iron panel Fer ok, Fe 27, TIBC 172   On iron supplement  CCM on board  Hypotension  Off Levophed  Stable on the lower end . SBP goal 110-180 . Long-term BP goal normotensive  Hyperlipidemia  Home meds:  pravachol 40, fish  oil, fenofibrate  LDL 70, goal < 70  On Crestor 20  Continue statin on discharge  Dysphagia At risk malnutrition . Secondary to stroke . NPO . on TF @ 38 . IVF NS @ 40 . 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 F/u in 6 mos. 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   Repeat in 6 months  Other Stroke Risk Factors  Former Cigarette smoker, quit 13 yrs ago  Substance abuse - UDS:  THC POSITIVE, Will advise to stop using due to stroke risk.  Hx stroke/TIA  06/2009 - midbrain and L PCA territory infarcts. Small vessel disease.    Other Active Problems  Hx skew deviation since childhood, worsened post stroke  Hypokalemia 3.3 supplemented  R thyroid nodule. Nonemergent [place] recommended  Depression on Effexor  Hospital day # 11  Patient unfortunately is not doing well and has significant respiratory failure and is unlikely to survive when extubated but family knows her wishes and feels she would not want prolonged ventilatory support and tracheostomy and life in a nursing home which would be unavoidable.  They have agreed to DNR likely withdrawal of support and comfort care.  Discussed with critical care team nurse practitioner. This patient is  critically ill and at significant risk of neurological worsening, death and care requires constant monitoring of vital signs, hemodynamics,respiratory and cardiac monitoring, extensive review of multiple databases, frequent neurological assessment, discussion with family, other specialists and medical decision making of high complexity.I have made any additions or clarifications directly to the above note.This critical care time does not reflect procedure time, or teaching time or supervisory time of PA/NP/Med Resident etc but could involve care discussion time.  I spent 30 minutes of neurocritical care time  in the care of  this patient.    Antony Contras, MD   To contact Stroke Continuity provider, please refer to [email or web address] After hours, contact General Neurology

## 2019-11-25 DEATH — deceased

## 2019-12-26 NOTE — Social Work (Signed)
CSW acknowledging consult for substance use. Pt currently under comfort measures per morning report. Consult not appropriate at this time, out of respect will mark as resolved.  Octavio Graves, MSW, LCSW [facility] Clinical Social Work

## 2019-12-26 NOTE — Progress Notes (Addendum)
Pt arrived from 4N ICU after being extubated today and transitioning to comfort care only. Pt was transferred from ICU bed to unit bed with assist from multiple staff members. She is generally unresponsive but opened her eyes during movement with no purposeful gaze. She unresponsive to verbal stimulation and does not have any type of verbal or reflexive responses. Placed on O2 via nasal canula for comfort. Pt respirations are unlabored, symmetrical with prolonged expiratory phase. Suctioned orally to remove secretions. Lungs are rhonchus and she is noted to have some gurgling. Skin assessed, IV patent and infusing morphine per comfort care order. Pt's family has left for the night and will return in the morning. Repositioned for comfort. Monitoring closely. 11/25/2019 2249 Paula Kim

## 2019-12-26 NOTE — Plan of Care (Signed)
  Problem: Education: Goal: Knowledge of General Education information will improve Description: Including pain rating scale, medication(s)/side effects and non-pharmacologic comfort measures Outcome: Not Progressing   

## 2019-12-26 NOTE — Progress Notes (Signed)
STROKE TEAM PROGRESS NOTE   INTERVAL HISTORY Patient's friend and lawyer is at the bedside.  She is resting comfortably on morphine drip.  Vital signs appear stable  Vitals:   11/25/19 1800 11/25/19 2012 11/25/19 2258 2019-12-21 0441  BP:   124/78 (!) 100/52  Pulse: 98  93 (!) 110  Resp: 19  14 10   Temp:  98.6 F (37 C) 100.3 F (37.9 C) (!) 100.6 F (38.1 C)  TempSrc:  Oral Axillary Axillary  SpO2: (!) 88%  (!) 80% (!) 75%  Weight:   70.3 kg   Height:        PHYSICAL EXAM     General - Well nourished, well developed, sedated on morphine drip Ophthalmologic - fundi not visualized due to noncooperation.  Cardiovascular - Regular rate and rhythm without stimulation  Neurological Exam :  -Patient is sedated on morphine drip and resting comfortably.  Eyes are closed.  No spontaneous extremity movements.  She withdraws to tactile stimuli by moving right side purposefully but not the left.  ASSESSMENT/PLAN Ms. Paraskevi Funez is a 64 y.o. female with history of HLD found down presenting with L sided weakness and dysarthria.  Stroke:   Large R MCA infarct w/ petechial hemorrhage R parietal s/p decompressive hemicrani - source unclear, ? large vessel vasculopathy  CT head extensive hypodensity R MCA territory w/ infarct.  CTA head proximal R M1 occlusion. Posterior circulation atherosclerosis - L VA stenosis vs focal occlusion. B mid P2 PCA moderate to severe stenoses.  CTA neck mixed plaque bifurcations and proximal B ICAs. Possible ulcerated plaque R ICA bulb. Lobar dilation B ICA bulbs. L ICA tortuous. 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   CT perfusion positive for penumbra  MRI  R MCA infarct w/ R parietal petechial hemorrhage. Chronic L parietooccipital infarcts.   CT repeat 2/21 - stable 36mm MLS  CT repeat 2/23 - slight interval increase in brain swelling through crani, interval evolution, stable 4 to 5 mm MLS  LE venous  doppler - negative for DVT bilaterally  2D Echo EF 60-65%. No source of embolus   Consider 30 day cardiac event monitoring as outpt  LDL 70  HgbA1c 5.0  Heparin 5000 units sq tid for VTE prophylaxis  aspirin 325 mg daily prior to admission, treated with aspirin 325 mg daily in hospital, now off  Disposition:  Pending. Will consider residential hospice should she continue to survive  Full comfort care   Acute Respiratory Failure  Intubated with 1-way extubation 3/1  Now on comfort care   Morphine gtt  Cerebral Edema  S/p Right decompression hemicrani by Cabbell on 11/13/2019  CT 2/21 stable 48mm MLS  CT repeat 2/23 - slight interval increase in brain swelling through crani, interval evolution, stable 4-5 mm MLS  On Keppra for seizure prevention  On 3% saline -> off -> NS @40  -> 1/2NS @40  ->NS @40   Na 142  Seizure like activity  Stimulation induced increased muscle tone at LUE and tonic posturing of RUE and RLE.   EEG left frontal sharp waves  On keppra 500 -> increase to 1000 bid  Repeat EEG 2/25 L anterior temporal lobe sharp waves  Continue keppra  Fever  Possible evolving LLL PNA  Tmax 100  Mild Leukocytosis 11.5  Blood culture 2/22 no growth   UA neg   CXR stable hazy opacities, ? Retrocardiac infiltrate  Surgical wound clear dry, no pus  Resp cx GPC in clusters,  GPR  On rocephin 2/26>>2/26->off  On cefepime  2/26>>off  On vanc 2/26>>off  Ancef 2/27 >>  Acute blood loss anemia, critical illness   Hb - 13->...->6.9->1u PRBC->...7.1   Stool guaiac - negative x 1  Close monitoring CBC  Iron panel Fer ok, Fe 27, TIBC 172   On iron supplement  CCM on board  Hypotension  Off Levophed  Stable on the lower end . SBP goal 110-180 . Long-term BP goal normotensive  Hyperlipidemia  Home meds:  pravachol 40, fish oil, fenofibrate  LDL 70, goal < 70  On Crestor 20  Continue statin on discharge  Dysphagia At risk  malnutrition . Secondary to stroke . NPO . on TF @ 55 . IVF NS @ 40 . 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 F/u in 6 mos. 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   Repeat in 6 months  Other Stroke Risk Factors  Former Cigarette smoker, quit 13 yrs ago  Substance abuse - UDS:  THC POSITIVE, Will advise to stop using due to stroke risk.  Hx stroke/TIA  06/2009 - midbrain and L PCA territory infarcts. Small vessel disease.    Other Active Problems  Hx skew deviation since childhood, worsened post stroke  Hypokalemia 3.3 supplemented  R thyroid nodule. Nonemergent [place] recommended  Depression on Effexor  Hospital day # 12 Patient has been made comfort care measures and DNR as per wishes from her niece health power of attorney.  Continue morphine drip.  If family is agreeable may consider transfer to [facility] in the next few days.  Discussed with her friend at the bedside and answered questions   Delia Heady, MD   To contact Stroke Continuity provider, please refer to [email or web address]. After hours, contact General Neurology

## 2019-12-26 NOTE — Progress Notes (Addendum)
Patient unresponsive. No respirations. No pulse. No heart beat. Pupils fixed and dilated. DNR order documented.  2nd RN in to pronounce. Deirdre Peer RN.

## 2019-12-26 NOTE — Discharge Summary (Signed)
Patient ID: Paula Kim MRN: [id number] DOB/AGE: May 06, 1956 64 y.o.  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 was a 64 year old lady with history of hyperlipidemia who presented with left-sided weakness which was discovered in the morning upon awakening.  She was brought to the emergency room outside the time window for TPA and CT angiogram and perfusion performed persistent 45 mm right left midline shift.  A large right MCA infarct with cytotoxic edema.  She was not a candidate for TPA or thrombectomy as she presented late.She was admitted to the intensive care unit and was at risk for developing malignant cerebral edema with midline shift and brain herniation.  Urgent neurosurgical consult was obtained and patient was taken for right hemicraniectomy by Dr. Mikal Plane.  Postop she was cared for in the ICU with strict blood pressure control.  Follow-up CT scan showed persistent cytotoxic edema with 5 mm shift.EEG showed left frontal sharp waves and she was started on Keppra for seizure prophylaxis.  Neurological exam remains poor with patient requiring full ventilatory support and sedation.  Use not noted to show significant improvement and family realized that the patient would need prolonged ventilatory support, tracheostomy and PEG tube and nursing home placement which they clearly did not want.  Hence patient's family  made the patient DNR and comfort care and patient was terminally extubated and kept comfortable on morphine drip.  Her condition declined gradually and she passed away  peacefully.  Signed: Delia Heady 11/28/2019, 7:58 PM

## 2019-12-26 NOTE — Plan of Care (Signed)
Patient is now comfort care. No family visiting at present.

## 2019-12-26 NOTE — Progress Notes (Signed)
Nutrition Brief Note  RD working remotely. Chart reviewed. Pt now transitioning to comfort care.  No further nutrition interventions warranted at this time.  Please re-consult as needed.   Braydan Marriott W, RD, LDN, CDCES Registered Dietitian II Certified Diabetes Care and Education Specialist Please refer to [facility] for RD and/or RD on-call/weekend/after hours pager  

## 2019-12-26 DEATH — deceased

## 2021-10-08 IMAGING — DX DG CHEST 1V PORT
1 series · 1 of 1 positions shown · non-contrast
Comparison: 11/22/2019

CLINICAL DATA: Acute respiratory failure with hypoxia

EXAM:
PORTABLE CHEST 1 VIEW

[chest ap]
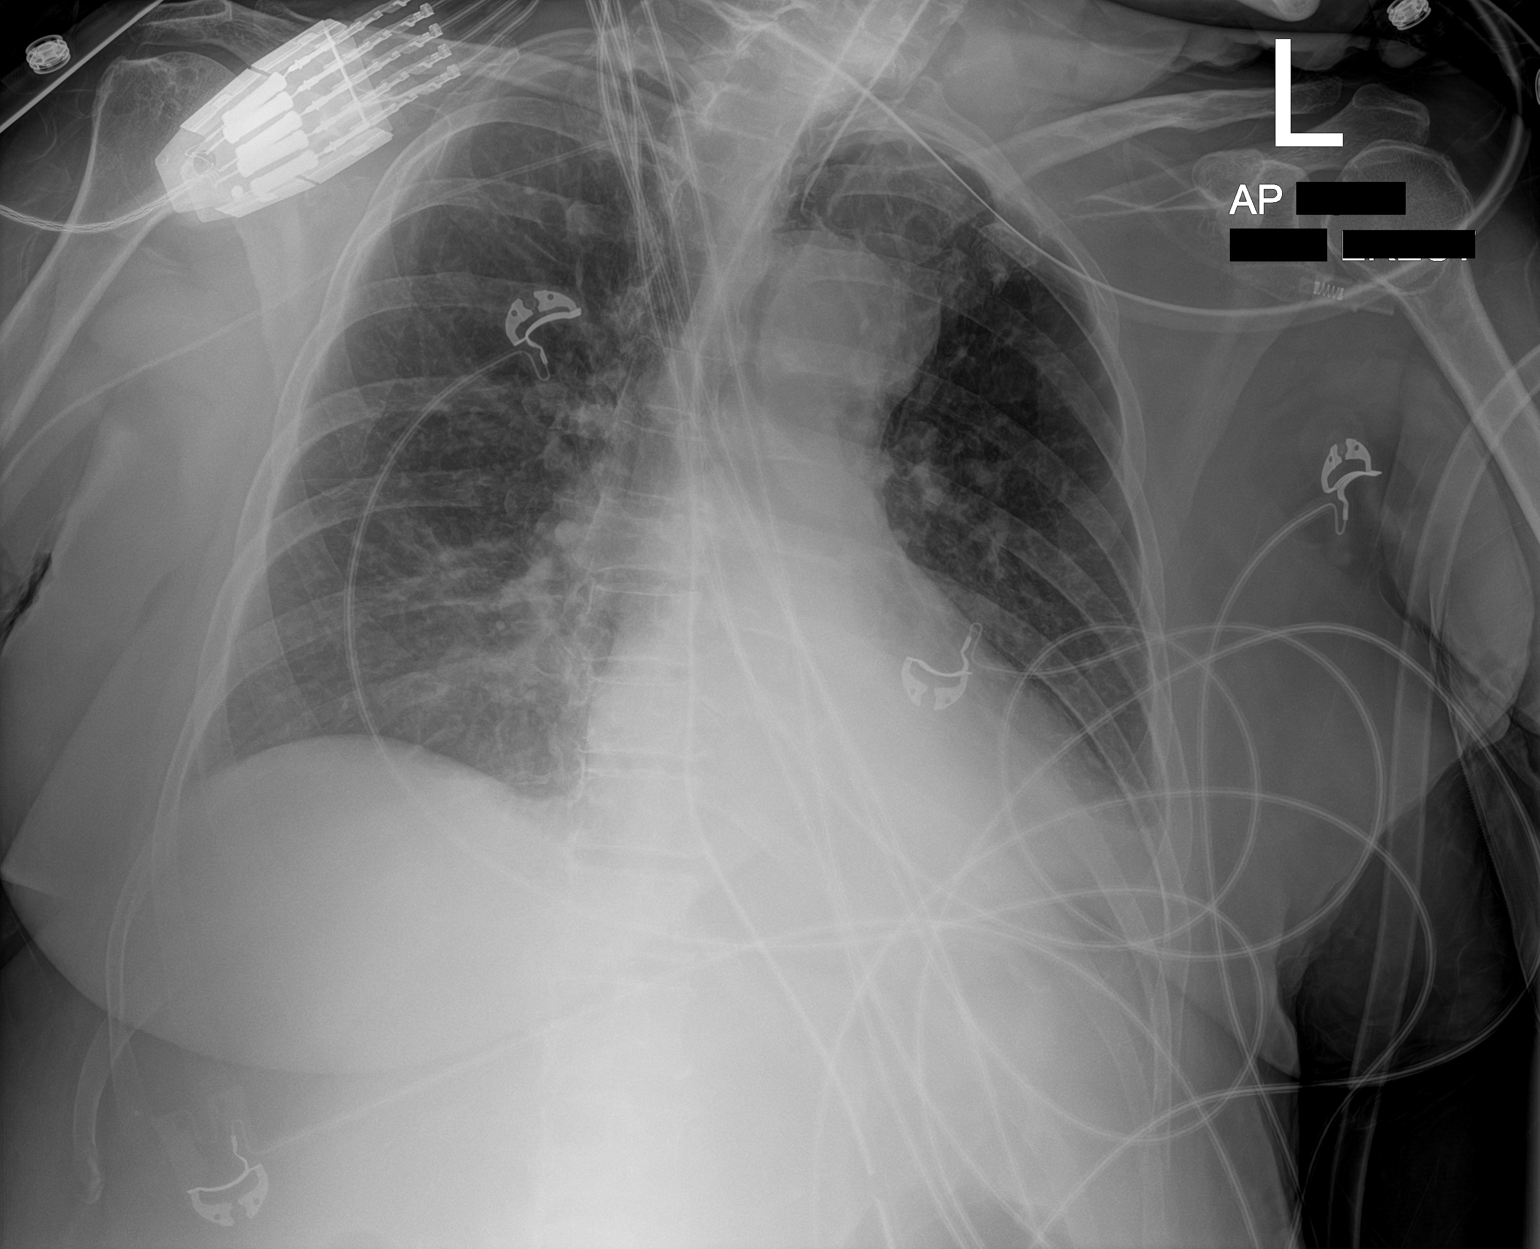

[1 of 1 positions shown; findings below may reference images not displayed]

FINDINGS: Endotracheal tube in good position. Right arm PICC tip not well seen
due to overlying EKG leads. NG tube in the body the stomach.

Left lower lobe airspace disease unchanged compatible with
atelectasis and effusion. Negative for heart failure. Right lung
clear.
IMPRESSION: Persistent left lower lobe consolidation and left effusion
unchanged. Support lines remain in good position.
